# Patient Record
Sex: Male | Born: 1975
Health system: Southern US, Community
[De-identification: ages and names within clinical notes are randomized; demographics above are authoritative.]

## PROBLEM LIST (undated history)

## (undated) DIAGNOSIS — G4700 Insomnia, unspecified: Secondary | ICD-10-CM

## (undated) DIAGNOSIS — R0602 Shortness of breath: Secondary | ICD-10-CM

## (undated) DIAGNOSIS — R519 Headache, unspecified: Secondary | ICD-10-CM

## (undated) DIAGNOSIS — I10 Essential (primary) hypertension: Secondary | ICD-10-CM

## (undated) DIAGNOSIS — G43909 Migraine, unspecified, not intractable, without status migrainosus: Secondary | ICD-10-CM

## (undated) DIAGNOSIS — M159 Polyosteoarthritis, unspecified: Secondary | ICD-10-CM

## (undated) DIAGNOSIS — J45909 Unspecified asthma, uncomplicated: Secondary | ICD-10-CM

## (undated) DIAGNOSIS — E559 Vitamin D deficiency, unspecified: Secondary | ICD-10-CM

## (undated) DIAGNOSIS — I4711 Inappropriate sinus tachycardia, so stated: Secondary | ICD-10-CM

## (undated) DIAGNOSIS — Z91018 Allergy to other foods: Secondary | ICD-10-CM

## (undated) DIAGNOSIS — E7849 Other hyperlipidemia: Secondary | ICD-10-CM

## (undated) DIAGNOSIS — L42 Pityriasis rosea: Secondary | ICD-10-CM

## (undated) DIAGNOSIS — G4730 Sleep apnea, unspecified: Secondary | ICD-10-CM

## (undated) DIAGNOSIS — K589 Irritable bowel syndrome without diarrhea: Secondary | ICD-10-CM

## (undated) DIAGNOSIS — R000 Tachycardia, unspecified: Secondary | ICD-10-CM

## (undated) DIAGNOSIS — R011 Cardiac murmur, unspecified: Secondary | ICD-10-CM

## (undated) DIAGNOSIS — J309 Allergic rhinitis, unspecified: Secondary | ICD-10-CM

## (undated) DIAGNOSIS — N319 Neuromuscular dysfunction of bladder, unspecified: Secondary | ICD-10-CM

## (undated) DIAGNOSIS — IMO0002 Reserved for concepts with insufficient information to code with codable children: Secondary | ICD-10-CM

## (undated) DIAGNOSIS — E785 Hyperlipidemia, unspecified: Secondary | ICD-10-CM

## (undated) DIAGNOSIS — R002 Palpitations: Secondary | ICD-10-CM

## (undated) DIAGNOSIS — K760 Fatty (change of) liver, not elsewhere classified: Secondary | ICD-10-CM

## (undated) DIAGNOSIS — R1310 Dysphagia, unspecified: Secondary | ICD-10-CM

## (undated) DIAGNOSIS — Z79890 Hormone replacement therapy (postmenopausal): Secondary | ICD-10-CM

## (undated) DIAGNOSIS — E1169 Type 2 diabetes mellitus with other specified complication: Secondary | ICD-10-CM

## (undated) DIAGNOSIS — E1142 Type 2 diabetes mellitus with diabetic polyneuropathy: Secondary | ICD-10-CM

## (undated) DIAGNOSIS — M549 Dorsalgia, unspecified: Secondary | ICD-10-CM

## (undated) DIAGNOSIS — R079 Chest pain, unspecified: Secondary | ICD-10-CM

## (undated) DIAGNOSIS — K3189 Other diseases of stomach and duodenum: Secondary | ICD-10-CM

## (undated) DIAGNOSIS — E1140 Type 2 diabetes mellitus with diabetic neuropathy, unspecified: Secondary | ICD-10-CM

## (undated) DIAGNOSIS — G5622 Lesion of ulnar nerve, left upper limb: Secondary | ICD-10-CM

## (undated) DIAGNOSIS — K9049 Malabsorption due to intolerance, not elsewhere classified: Secondary | ICD-10-CM

## (undated) DIAGNOSIS — M7989 Other specified soft tissue disorders: Secondary | ICD-10-CM

## (undated) DIAGNOSIS — M2550 Pain in unspecified joint: Secondary | ICD-10-CM

## (undated) DIAGNOSIS — F32A Depression, unspecified: Secondary | ICD-10-CM

## (undated) DIAGNOSIS — E7800 Pure hypercholesterolemia, unspecified: Secondary | ICD-10-CM

## (undated) DIAGNOSIS — E739 Lactose intolerance, unspecified: Secondary | ICD-10-CM

## (undated) DIAGNOSIS — Z933 Colostomy status: Secondary | ICD-10-CM

## (undated) DIAGNOSIS — R339 Retention of urine, unspecified: Secondary | ICD-10-CM

## (undated) DIAGNOSIS — E109 Type 1 diabetes mellitus without complications: Secondary | ICD-10-CM

## (undated) DIAGNOSIS — K769 Liver disease, unspecified: Secondary | ICD-10-CM

## (undated) HISTORY — DX: Dysphagia, unspecified: R13.10

## (undated) HISTORY — PX: TRIGGER FINGER RELEASE: SHX641

## (undated) HISTORY — PX: SUPRAPUBIC CATHETER PLACEMENT: SHX2473

## (undated) HISTORY — DX: Type 2 diabetes mellitus with diabetic neuropathy, unspecified: E11.40

## (undated) HISTORY — DX: Chest pain, unspecified: R07.9

## (undated) HISTORY — DX: Fatty (change of) liver, not elsewhere classified: K76.0

## (undated) HISTORY — PX: ABDOMINAL HYSTERECTOMY: SHX81

## (undated) HISTORY — DX: Pure hypercholesterolemia, unspecified: E78.00

## (undated) HISTORY — DX: Hyperlipidemia, unspecified: E78.5

## (undated) HISTORY — DX: Type 1 diabetes mellitus without complications: E10.9

## (undated) HISTORY — DX: Polyosteoarthritis, unspecified: M15.9

## (undated) HISTORY — DX: Type 2 diabetes mellitus with diabetic polyneuropathy: E11.42

## (undated) HISTORY — DX: Dorsalgia, unspecified: M54.9

## (undated) HISTORY — DX: Shortness of breath: R06.02

## (undated) HISTORY — DX: Insomnia, unspecified: G47.00

## (undated) HISTORY — DX: Liver disease, unspecified: K76.9

## (undated) HISTORY — DX: Colostomy status: Z93.3

## (undated) HISTORY — DX: Other hyperlipidemia: E78.49

## (undated) HISTORY — DX: Tachycardia, unspecified: R00.0

## (undated) HISTORY — DX: Palpitations: R00.2

## (undated) HISTORY — DX: Unspecified asthma, uncomplicated: J45.909

## (undated) HISTORY — DX: Essential (primary) hypertension: I10

## (undated) HISTORY — DX: Hormone replacement therapy: Z79.890

## (undated) HISTORY — DX: Morbid (severe) obesity due to excess calories: E66.01

## (undated) HISTORY — DX: Reserved for concepts with insufficient information to code with codable children: IMO0002

## (undated) HISTORY — DX: Other specified soft tissue disorders: M79.89

## (undated) HISTORY — PX: SEPTOPLASTY: SUR1290

## (undated) HISTORY — DX: Depression, unspecified: F32.A

## (undated) HISTORY — DX: Allergic rhinitis, unspecified: J30.9

## (undated) HISTORY — DX: Malabsorption due to intolerance, not elsewhere classified: K90.49

## (undated) HISTORY — DX: Migraine, unspecified, not intractable, without status migrainosus: G43.909

## (undated) HISTORY — DX: Pain in unspecified joint: M25.50

## (undated) HISTORY — DX: Type 2 diabetes mellitus with other specified complication: E11.69

## (undated) HISTORY — DX: Other diseases of stomach and duodenum: K31.89

## (undated) HISTORY — DX: Inappropriate sinus tachycardia, so stated: I47.11

## (undated) HISTORY — DX: Sleep apnea, unspecified: G47.30

## (undated) HISTORY — DX: Pityriasis rosea: L42

## (undated) HISTORY — PX: PARASTOMAL HERNIA REPAIR: SHX2162

## (undated) HISTORY — DX: Lesion of ulnar nerve, left upper limb: G56.22

## (undated) HISTORY — DX: Irritable bowel syndrome, unspecified: K58.9

## (undated) HISTORY — DX: Retention of urine, unspecified: R33.9

## (undated) HISTORY — DX: Allergy to other foods: Z91.018

## (undated) HISTORY — DX: Lactose intolerance, unspecified: E73.9

## (undated) HISTORY — DX: Vitamin D deficiency, unspecified: E55.9

## (undated) HISTORY — DX: Neuromuscular dysfunction of bladder, unspecified: N31.9

---

## 1998-07-26 HISTORY — PX: CHOLECYSTECTOMY: SHX55

## 2003-07-27 HISTORY — PX: BREAST REDUCTION SURGERY: SHX8

## 2003-07-27 HISTORY — PX: RHINOPLASTY: SUR1284

## 2004-07-26 HISTORY — PX: SPINAL FUSION: SHX223

## 2011-03-01 ENCOUNTER — Ambulatory Visit: Payer: Self-pay | Admitting: Endocrinology

## 2011-03-23 ENCOUNTER — Encounter: Payer: Self-pay | Admitting: Endocrinology

## 2011-03-23 ENCOUNTER — Ambulatory Visit (INDEPENDENT_AMBULATORY_CARE_PROVIDER_SITE_OTHER): Payer: Medicare Other | Admitting: Endocrinology

## 2011-03-23 DIAGNOSIS — M545 Low back pain: Secondary | ICD-10-CM

## 2011-03-23 DIAGNOSIS — E109 Type 1 diabetes mellitus without complications: Secondary | ICD-10-CM

## 2011-03-23 DIAGNOSIS — R109 Unspecified abdominal pain: Secondary | ICD-10-CM

## 2011-03-23 NOTE — Patient Instructions (Addendum)
good diet and exercise habits significanly improve the control of your diabetes.  please let me know if you wish to be referred to a dietician.  high blood sugar is very risky to your health.  you should see an eye doctor every year. controlling your blood pressure and cholesterol drastically reduces the damage diabetes does to your body.  this also applies to quitting smoking.  please discuss these with your doctor.   check your blood sugar 2 times a day.  vary the time of day when you check, between before the 3 meals, and at bedtime.  also check if you have symptoms of your blood sugar being too high or too low.  please keep a record of the readings and bring it to your next appointment here.  please call us sooner if you are having low blood sugar episodes. we will need to take this complex situation in stages.  For now, reduce the lantus to 20 units at bedtime, and: Increase humalog to 1 unit for each 6 grams of carbohydrate.   Please make a follow-up appointment in 2-4 weeks. (update: i left message on phone-tree:  rx as we discussed)

## 2011-03-23 NOTE — Progress Notes (Signed)
Subjective:    Patient ID: Gavin Smith, male    DOB: 1975/11/22, 35 y.o.   MRN: [id number]  HPI pt states 14 years h/o dm.  She reports autonomic and peripheral sensory neuropathy.  However, she says these predated the dx of dm.  He has had extensive w/u of the gi sxs she has been on insulin since dx.  she takes lantus, and prn humalog (she says this is 1 unit for each 7 grams of carbohydrate--ends up being 25-38 units per meal).  Pt says cbg's are variable--sometimes low, and sometimes over 400.  She says it is in general higher as the day goes on.  She says she cannot take huamlog until after eating, because of her sxs--15 years of postprandial moderate cramps in the abdomen, and assoc diarrhea. pt says his diet is very good, and exercise is limited by low-back pain.    Past Medical History  Diagnosis Date  . Extrinsic asthma, unspecified   . Allergic rhinitis, cause unspecified   . Essential hypertension, benign   . Type I (juvenile type) diabetes mellitus without mention of complication, not stated as uncontrolled   . Pure hypercholesterolemia   . Generalized osteoarthrosis, involving multiple sites   . Unspecified sleep apnea   . Pityriasis rosea   . Migraine, unspecified, without mention of intractable migraine without mention of status migrainosus   . Insomnia, unspecified     Past Surgical History  Procedure Date  . Breast reduction surgery 2005  . Spinal fusion 2006    L5-S1 spinal fusion  . Cholecystectomy 2000  . Rhinoplasty 2005    History   Social History  . Marital Status: Single    Spouse Name: N/A    Number of Children: 0  . Years of Education: 14   Occupational History  . Not on file.   Social History Main Topics  . Smoking status: Never Smoker   . Smokeless tobacco: Not on file  . Alcohol Use: No  . Drug Use: No  . Sexually Active:    Other Topics Concern  . Not on file   Social History Narrative  . No narrative on file  disabled  No current  outpatient prescriptions on file prior to visit.    Allergies  Allergen Reactions  . Morphine And Related   . Penicillins     Family History  Problem Relation Age of Onset  . Diabetes Mother   . Hypertension Mother   . Hyperlipidemia Mother   . Hypertension Father   . Hyperlipidemia Father   . Diabetes Maternal Grandmother   . Cancer Maternal Grandmother     Breast Cancer  . Hypertension Maternal Grandfather   . Heart disease Maternal Grandfather   . Heart disease Paternal Grandfather   . Cancer Other     Ovarian Cancer-Grandparent   BP 110/84  Pulse 101  Temp(Src) 99.1 F (37.3 C) (Oral)  Ht 5\' 9"  (1.753 m)  Wt 297 lb 12.8 oz (135.081 kg)  BMI 43.98 kg/m2  SpO2 95%  Review of Systems denies weight loss, blurry vision, chest pain, sob, n/v, cramps, rhinorrhea, and memory loss.  She has hypoglycemia when she gets the diarrhea.  She has urinary frequency, depression, easy bruising, and excessive diaphoresis.  She attributes headaches to head injury a few years ago.  She attributes sob to asthma.    Objective:   Physical Exam VS: see vs page GEN: no distress.  obese HEAD: head: no deformity eyes: no periorbital swelling, no  proptosis external nose and ears are normal mouth: no lesion seen NECK: supple, thyroid is not enlarged CHEST WALL: no deformity CV: reg rate and rhythm, no murmur ABD: abdomen is soft, nontender.  no hepatosplenomegaly.  not distended.  no hernia MUSCULOSKELETAL: muscle bulk and strength are grossly normal.  no obvious joint swelling.  gait is normal and steady EXTEMITIES: no deformity.  no ulcer on the feet.  feet are of normal color and temp.  no edema PULSES: dorsalis pedis intact bilat.  no carotid bruit NEURO:  cn 2-12 grossly intact.   readily moves all 4's.  sensation is intact to touch on the feet SKIN:  Normal texture and temperature.  No rash or suspicious lesion is visible.   NODES:  None palpable at the neck PSYCH: alert, oriented  x3.  Does not appear anxious nor depressed.  outside test results are reviewed: A1c=8.7     Assessment & Plan:  Dm.  The pattern of cbg's indicates the need for more humalog, and less lantus.  Low-back pain.  This limits exercise rx of dm Gi sxs, uncertain etiology.  This also limits the rx of dm. Morbid obesity.  This worsens insulin resistance.

## 2011-03-24 ENCOUNTER — Encounter: Payer: Self-pay | Admitting: Endocrinology

## 2011-03-24 DIAGNOSIS — E1142 Type 2 diabetes mellitus with diabetic polyneuropathy: Secondary | ICD-10-CM | POA: Insufficient documentation

## 2011-03-24 DIAGNOSIS — M159 Polyosteoarthritis, unspecified: Secondary | ICD-10-CM | POA: Insufficient documentation

## 2011-03-24 DIAGNOSIS — I10 Essential (primary) hypertension: Secondary | ICD-10-CM | POA: Insufficient documentation

## 2011-03-24 DIAGNOSIS — J45909 Unspecified asthma, uncomplicated: Secondary | ICD-10-CM | POA: Insufficient documentation

## 2011-04-20 ENCOUNTER — Ambulatory Visit: Payer: Medicare Other | Admitting: Endocrinology

## 2011-05-28 ENCOUNTER — Encounter: Payer: Self-pay | Admitting: Endocrinology

## 2011-05-28 ENCOUNTER — Ambulatory Visit (INDEPENDENT_AMBULATORY_CARE_PROVIDER_SITE_OTHER): Payer: Medicare Other | Admitting: Endocrinology

## 2011-05-28 ENCOUNTER — Other Ambulatory Visit (INDEPENDENT_AMBULATORY_CARE_PROVIDER_SITE_OTHER): Payer: Medicare Other

## 2011-05-28 VITALS — BP 124/86 | HR 109 | Temp 98.4°F | Ht 69.0 in | Wt 295.0 lb

## 2011-05-28 DIAGNOSIS — E109 Type 1 diabetes mellitus without complications: Secondary | ICD-10-CM

## 2011-05-28 DIAGNOSIS — R109 Unspecified abdominal pain: Secondary | ICD-10-CM

## 2011-05-28 LAB — HEMOGLOBIN A1C: Hgb A1c MFr Bld: 7.6 % — ABNORMAL HIGH (ref 4.6–6.5)

## 2011-05-28 NOTE — Patient Instructions (Addendum)
check your blood sugar 2 times a day.  vary the time of day when you check, between before the 3 meals, and at bedtime.  also check if you have symptoms of your blood sugar being too high or too low.  please keep a record of the readings and bring it to your next appointment here.  please call us sooner if you are having low blood sugar episodes. blood tests are being requested for you today.  please call [phone] to hear your test results.  You will be prompted to enter the 9-digit "MRN" number that appears at the top left of this page, followed by #.  Then you will hear the message. we will need to take this complex situation in stages.   For now, reduce the lantus to 20 units at bedtime, and:   Increase humalog to 1 unit for each 5 grams of carbohydrate.   Please make a follow-up appointment in 1 month.   Please sign release of information from your gastroenterolgist in [place].

## 2011-05-28 NOTE — Progress Notes (Signed)
Subjective:    Patient ID: Gavin Smith, male    DOB: [birth year], 35 y.o.   MRN: [id number]  HPI Pt says control of her dm continues to be affected by her gi motility problems.  She has "years" of intermittent moderate discomfort in the abdomen, and assoc diarrhea.  she brings a record of her cbg's which i have reviewed today.  It varies from 44-350.  However, most are in the 100's.  Pt says these cbg's are better since last ov.  She increased the humalog, but she did not decrease the lantus.  She says the hypoglycemia is when she misses meals.  She averages approx 100 units of humalog per day Past Medical History  Diagnosis Date  . Unspecified sleep apnea   . Pityriasis rosea   . Migraine, unspecified, without mention of intractable migraine without mention of status migrainosus   . Insomnia, unspecified   . Allergic rhinitis, cause unspecified   . Generalized osteoarthrosis, involving multiple sites   . Extrinsic asthma, unspecified   . Type I (juvenile type) diabetes mellitus without mention of complication, not stated as uncontrolled   . Pure hypercholesterolemia   . Essential hypertension, benign     Past Surgical History  Procedure Date  . Breast reduction surgery 2005  . Spinal fusion 2006    L5-S1 spinal fusion  . Cholecystectomy 2000  . Rhinoplasty 2005    History   Social History  . Marital Status: Single    Spouse Name: N/A    Number of Children: 0  . Years of Education: 14   Occupational History  . Not on file.   Social History Main Topics  . Smoking status: Never Smoker   . Smokeless tobacco: Not on file  . Alcohol Use: No  . Drug Use: No  . Sexually Active:    Other Topics Concern  . Not on file   Social History Narrative  . No narrative on file    Current Outpatient Prescriptions on File Prior to Visit  Medication Sig Dispense Refill  . albuterol (PROAIR HFA) 108 (90 BASE) MCG/ACT inhaler Inhale 1 puff into the lungs as needed.        Marland Kitchen albuterol  (PROVENTIL) (2.5 MG/3ML) 0.083% nebulizer solution Take 2.5 mg by nebulization as needed.        . Cholecalciferol (VITAMIN D3) 5000 UNITS CAPS Take 1 capsule by mouth daily.        Marland Kitchen doxycycline (DORYX) 75 MG EC tablet Take 75 mg by mouth 2 (two) times daily.        Marland Kitchen gabapentin (NEURONTIN) 600 MG tablet Take 600 mg by mouth 3 (three) times daily.        Marland Kitchen glucose blood test strip 1 each by Other route as needed. Use as instructed       . insulin glargine (LANTUS SOLOSTAR) 100 UNIT/ML injection Inject 20 Units into the skin every evening.       . insulin lispro (HUMALOG KWIKPEN) 100 UNIT/ML injection Inject into the skin. variable dosage, total of approx 100 units per day      . Lancets Thin MISC Use as directed       . lisinopril (PRINIVIL,ZESTRIL) 10 MG tablet Take 10 mg by mouth daily.        Marland Kitchen loratadine (CLARITIN) 10 MG tablet Take 10 mg by mouth daily.        . Melatonin 5 MG TABS Take 1 tablet by mouth at bedtime as needed.       Marland Kitchen  Multiple Vitamin (MULTI-DAY PO) Take 1 tablet by mouth daily.        . simvastatin (ZOCOR) 40 MG tablet Take 40 mg by mouth at bedtime.        . traMADol (ULTRAM) 50 MG tablet Take 50 mg by mouth as needed.        . vitamin C (ASCORBIC ACID) 500 MG tablet Take 500 mg by mouth daily.        Marland Kitchen zolpidem (AMBIEN) 10 MG tablet Take 10 mg by mouth at bedtime as needed.          Allergies  Allergen Reactions  . Morphine And Related   . Penicillins     Family History  Problem Relation Age of Onset  . Diabetes Mother   . Hypertension Mother   . Hyperlipidemia Mother   . Hypertension Father   . Hyperlipidemia Father   . Diabetes Maternal Grandmother   . Cancer Maternal Grandmother     Breast Cancer  . Hypertension Maternal Grandfather   . Heart disease Maternal Grandfather   . Heart disease Paternal Grandfather   . Cancer Other     Ovarian Cancer-Grandparent   BP 124/86  Pulse 109  Temp(Src) 98.4 F (36.9 C) (Oral)  Ht 5\' 9"  (1.753 m)  Wt 295 lb  (133.811 kg)  BMI 43.56 kg/m2  SpO2 97%  Review of Systems Denies loc.    Objective:   Physical Exam VITAL SIGNS:  See vs page. GENERAL: no distress. SKIN:  Insulin injection sites at the anterior abdomen are normal.  Lab Results  Component Value Date   HGBA1C 7.6* 05/28/2011      Assessment & Plan:  Gi dx of uncertain nature. Dm, Based on the pattern of her cbg's, she needs some adjustment in her therapy.

## 2011-07-09 ENCOUNTER — Encounter: Payer: Self-pay | Admitting: *Deleted

## 2011-07-28 NOTE — Telephone Encounter (Signed)
A user error has taken place: encounter opened in error, closed for administrative reasons.

## 2011-09-06 ENCOUNTER — Telehealth: Payer: Self-pay | Admitting: Endocrinology

## 2011-09-06 MED ORDER — GLUCOSE BLOOD VI STRP: ORAL_STRIP | Status: DC

## 2011-09-06 NOTE — Telephone Encounter (Signed)
Pt is requesting test strips--pt [phone]

## 2011-09-06 NOTE — Telephone Encounter (Signed)
Rx sent to pharmacy for Onetouch Ultra test strips-pt informed.

## 2011-09-16 ENCOUNTER — Other Ambulatory Visit: Payer: Self-pay | Admitting: *Deleted

## 2011-09-16 MED ORDER — GLUCOSE BLOOD VI STRP: ORAL_STRIP | Status: AC

## 2011-09-16 NOTE — Telephone Encounter (Signed)
Pt needs rx for test strips to be corrected from twice daily to four times daily because of Medicare paperwork. Rx resent to pharmacy.

## 2011-10-01 ENCOUNTER — Ambulatory Visit (INDEPENDENT_AMBULATORY_CARE_PROVIDER_SITE_OTHER): Payer: Medicare Other | Admitting: Endocrinology

## 2011-10-01 ENCOUNTER — Other Ambulatory Visit (INDEPENDENT_AMBULATORY_CARE_PROVIDER_SITE_OTHER): Payer: Medicare Other

## 2011-10-01 ENCOUNTER — Encounter: Payer: Self-pay | Admitting: Endocrinology

## 2011-10-01 VITALS — BP 112/82 | HR 101 | Temp 98.4°F | Ht 69.0 in | Wt 292.1 lb

## 2011-10-01 DIAGNOSIS — E109 Type 1 diabetes mellitus without complications: Secondary | ICD-10-CM

## 2011-10-01 LAB — HEMOGLOBIN A1C: Hgb A1c MFr Bld: 8.3 % — ABNORMAL HIGH (ref 4.6–6.5)

## 2011-10-01 NOTE — Patient Instructions (Addendum)
check your blood sugar 2 times a day.  vary the time of day when you check, between before the 3 meals, and at bedtime.  also check if you have symptoms of your blood sugar being too high or too low.  please keep a record of the readings and bring it to your next appointment here.  please call us sooner if you are having low blood sugar episodes. blood tests are being requested for you today.  please call [phone] to hear your test results.  You will be prompted to enter the 9-digit "MRN" number that appears at the top left of this page, followed by #.  Then you will hear the message.  For now, reduce the lantus to 20 units at bedtime, and:   take humalog, 1 unit for each 5 grams of carbohydrate.   Please make a follow-up appointment in 3 months. Here is a copy of 1 of the forms from [facility]. (update: i left message on phone-tree:  Increase lantus to 27 qhs.  Please come back for a follow-up appointment in 6 weeks)

## 2011-10-01 NOTE — Progress Notes (Signed)
Subjective:    Patient ID: Gavin Smith, male    DOB: 1976/04/30, 36 y.o.   MRN: [id number]  HPI Pt returns for f/u of type 1 DM (1998).  Pt says she ran out of strips.  i showed her the 3 forms i have completed for test strips ([facility]).  She takes lantus 27 units qhs, and humalog at a varying dosage.   Past Medical History  Diagnosis Date  . Unspecified sleep apnea   . Pityriasis rosea   . Migraine, unspecified, without mention of intractable migraine without mention of status migrainosus   . Insomnia, unspecified   . Allergic rhinitis, cause unspecified   . Generalized osteoarthrosis, involving multiple sites   . Extrinsic asthma, unspecified   . Type I (juvenile type) diabetes mellitus without mention of complication, not stated as uncontrolled   . Pure hypercholesterolemia   . Essential hypertension, benign     Past Surgical History  Procedure Date  . Breast reduction surgery 2005  . Spinal fusion 2006    L5-S1 spinal fusion  . Cholecystectomy 2000  . Rhinoplasty 2005    History   Social History  . Marital Status: Single    Spouse Name: N/A    Number of Children: 0  . Years of Education: 14   Occupational History  . Not on file.   Social History Main Topics  . Smoking status: Never Smoker   . Smokeless tobacco: Not on file  . Alcohol Use: No  . Drug Use: No  . Sexually Active:    Other Topics Concern  . Not on file   Social History Narrative  . No narrative on file    Current Outpatient Prescriptions on File Prior to Visit  Medication Sig Dispense Refill  . albuterol (PROAIR HFA) 108 (90 BASE) MCG/ACT inhaler Inhale 1 puff into the lungs as needed.        Marland Kitchen albuterol (PROVENTIL) (2.5 MG/3ML) 0.083% nebulizer solution Take 2.5 mg by nebulization as needed.        . Cholecalciferol (VITAMIN D3) 5000 UNITS CAPS Take 1 capsule by mouth daily.        Marland Kitchen gabapentin (NEURONTIN) 600 MG tablet Take 600 mg by mouth 3 (three) times daily.        Marland Kitchen glucose blood  (ONE TOUCH ULTRA TEST) test strip Use as instructed two ttimes daily dx 250.01  200 each  3  . insulin glargine (LANTUS SOLOSTAR) 100 UNIT/ML injection Inject 20 Units into the skin every evening.       . insulin lispro (HUMALOG KWIKPEN) 100 UNIT/ML injection Inject into the skin. variable dosage, total of approx 100 units per day      . Lancets Thin MISC Use as directed       . lisinopril (PRINIVIL,ZESTRIL) 10 MG tablet Take 10 mg by mouth daily.        Marland Kitchen loratadine (CLARITIN) 10 MG tablet Take 10 mg by mouth daily.        . Melatonin 5 MG TABS Take 1 tablet by mouth at bedtime as needed.       . Multiple Vitamin (MULTI-DAY PO) Take 1 tablet by mouth daily.        . simvastatin (ZOCOR) 40 MG tablet Take 40 mg by mouth at bedtime.        . traMADol (ULTRAM) 50 MG tablet Take 50 mg by mouth as needed.        . vitamin C (ASCORBIC ACID) 500 MG tablet Take  500 mg by mouth daily.        Marland Kitchen zolpidem (AMBIEN) 10 MG tablet Take 10 mg by mouth at bedtime as needed.          Allergies  Allergen Reactions  . Morphine And Related   . Penicillins     Family History  Problem Relation Age of Onset  . Diabetes Mother   . Hypertension Mother   . Hyperlipidemia Mother   . Hypertension Father   . Hyperlipidemia Father   . Diabetes Maternal Grandmother   . Cancer Maternal Grandmother     Breast Cancer  . Hypertension Maternal Grandfather   . Heart disease Maternal Grandfather   . Heart disease Paternal Grandfather   . Cancer Other     Ovarian Cancer-Grandparent    BP 112/82  Pulse 101  Temp(Src) 98.4 F (36.9 C) (Oral)  Ht 5\' 9"  (1.753 m)  Wt 292 lb 1.9 oz (132.505 kg)  BMI 43.14 kg/m2  SpO2 97%    Review of Systems denies hypoglycemia    Objective:   Physical Exam VITAL SIGNS:  See vs page GENERAL: no distress Pulses: dorsalis pedis intact bilat.   Feet: no deformity.  no ulcer on the feet.  feet are of normal color and temp.  no edema Neuro: sensation is intact to touch on the  feet, but decreased from normal.    Lab Results  Component Value Date   HGBA1C 8.3* 10/01/2011      Assessment & Plan:  DM, therapy limited by noncompliance.  i'll do the best i can.  needs increased rx

## 2012-01-03 ENCOUNTER — Ambulatory Visit: Payer: Medicare Other | Admitting: Endocrinology

## 2012-01-07 ENCOUNTER — Other Ambulatory Visit (INDEPENDENT_AMBULATORY_CARE_PROVIDER_SITE_OTHER): Payer: Medicare Other

## 2012-01-07 ENCOUNTER — Ambulatory Visit (INDEPENDENT_AMBULATORY_CARE_PROVIDER_SITE_OTHER): Payer: Medicare Other | Admitting: Endocrinology

## 2012-01-07 ENCOUNTER — Encounter: Payer: Self-pay | Admitting: Endocrinology

## 2012-01-07 VITALS — BP 108/70 | HR 116 | Temp 99.2°F | Ht 69.0 in | Wt 291.0 lb

## 2012-01-07 DIAGNOSIS — E109 Type 1 diabetes mellitus without complications: Secondary | ICD-10-CM

## 2012-01-07 LAB — HEMOGLOBIN A1C: Hgb A1c MFr Bld: 7.5 % — ABNORMAL HIGH (ref 4.6–6.5)

## 2012-01-07 MED ORDER — "PARADIGM QUICK-SET 32"" 6MM MISC": 1.0000 | Status: DC

## 2012-01-07 MED ORDER — PARADIGM PUMP RESERVOIR 3ML MISC: 1.0000 | Status: DC

## 2012-01-07 NOTE — Patient Instructions (Addendum)
check your blood sugar 2 times a day.  vary the time of day when you check, between before the 3 meals, and at bedtime.  also check if you have symptoms of your blood sugar being too high or too low.  please keep a record of the readings and bring it to your next appointment here.  please call us sooner if you are having low blood sugar episodes.  continue basal rate of 1.2 units/hr (00:00-09:00), 1.8 units/hr (09:11:00), 1.85 units/hr (11:00-21:30), and 1.4 units/hr (21:30-24:00) continue mealtime bolus of 1 unit/ 18 grams carbohydrate for breakfast, 1 unit/20 grams with lunch, and 1 unit 21 grams with supper. continue correction bolus (which some people call "sensitivity," or "insulin sensitivity ratio," or just "isr") of 1 unit for each 26 by which your glucose exceeds 100 at midnight, 30 at noon, and 28 in the evening.   Please make a follow-up appointment in 3 months.  blood tests are being requested for you today.  You will receive a letter with results.

## 2012-01-07 NOTE — Progress Notes (Signed)
Subjective:    Patient ID: Gavin Smith, male    DOB: 10-19-1975, 36 y.o.   MRN: [id number]  HPI Pt returns for f/u of type 1 DM (dx'ed 1998; no known complications).  She started on an insulin pump 4 weeks ago.  she brings a record of her cbg's which i have reviewed today.  It varies from 84-200's.  There is no trend throughout the day.  pt states she feels well in general.  She averages 50 units/day, total.   Past Medical History  Diagnosis Date  . Unspecified sleep apnea   . Pityriasis rosea   . Migraine, unspecified, without mention of intractable migraine without mention of status migrainosus   . Insomnia, unspecified   . Allergic rhinitis, cause unspecified   . Generalized osteoarthrosis, involving multiple sites   . Extrinsic asthma, unspecified   . Type I (juvenile type) diabetes mellitus without mention of complication, not stated as uncontrolled   . Pure hypercholesterolemia   . Essential hypertension, benign     Past Surgical History  Procedure Date  . Breast reduction surgery 2005  . Spinal fusion 2006    L5-S1 spinal fusion  . Cholecystectomy 2000  . Rhinoplasty 2005    History   Social History  . Marital Status: Single    Spouse Name: N/A    Number of Children: 0  . Years of Education: 14   Occupational History  . Not on file.   Social History Main Topics  . Smoking status: Never Smoker   . Smokeless tobacco: Not on file  . Alcohol Use: No  . Drug Use: No  . Sexually Active:    Other Topics Concern  . Not on file   Social History Narrative  . No narrative on file    Current Outpatient Prescriptions on File Prior to Visit  Medication Sig Dispense Refill  . albuterol (PROAIR HFA) 108 (90 BASE) MCG/ACT inhaler Inhale 1 puff into the lungs as needed.        Marland Kitchen albuterol (PROVENTIL) (2.5 MG/3ML) 0.083% nebulizer solution Take 2.5 mg by nebulization as needed.        Marland Kitchen glucose blood (ONE TOUCH ULTRA TEST) test strip Use as instructed two ttimes daily dx  250.01  200 each  3  . insulin lispro (HUMALOG KWIKPEN) 100 UNIT/ML injection Inject into the skin. variable dosage, total of approx 100 units per day      . Lancets Thin MISC Use as directed       . lisinopril (PRINIVIL,ZESTRIL) 10 MG tablet Take 10 mg by mouth daily.        Marland Kitchen loratadine (CLARITIN) 10 MG tablet Take 10 mg by mouth daily.        . Melatonin 5 MG TABS Take 1 tablet by mouth at bedtime as needed.       . Multiple Vitamin (MULTI-DAY PO) Take 1 tablet by mouth daily.        Marland Kitchen NOVOFINE 32G X 6 MM MISC Use as directed      . simvastatin (ZOCOR) 40 MG tablet Take 40 mg by mouth at bedtime.        . sulfamethoxazole-trimethoprim (BACTRIM DS) 800-160 MG per tablet Take 1 tablet by mouth daily.       . traMADol (ULTRAM) 50 MG tablet Take 50 mg by mouth as needed.        . vitamin C (ASCORBIC ACID) 500 MG tablet Take 500 mg by mouth daily.        Marland Kitchen  zolpidem (AMBIEN) 10 MG tablet Take 10 mg by mouth at bedtime as needed.        . insulin glargine (LANTUS SOLOSTAR) 100 UNIT/ML injection Inject 20 Units into the skin every evening.         Allergies  Allergen Reactions  . Morphine And Related   . Penicillins     Family History  Problem Relation Age of Onset  . Diabetes Mother   . Hypertension Mother   . Hyperlipidemia Mother   . Hypertension Father   . Hyperlipidemia Father   . Diabetes Maternal Grandmother   . Cancer Maternal Grandmother     Breast Cancer  . Hypertension Maternal Grandfather   . Heart disease Maternal Grandfather   . Heart disease Paternal Grandfather   . Cancer Other     Ovarian Cancer-Grandparent    BP 108/70  Pulse 116  Temp 99.2 F (37.3 C) (Oral)  Ht 5\' 9"  (1.753 m)  Wt 291 lb (131.997 kg)  BMI 42.97 kg/m2  SpO2 97%  Review of Systems denies hypoglycemia.      Objective:   Physical Exam VITAL SIGNS:  See vs page GENERAL: no distress SKIN:  Insulin infusion sites at the anterior abdomen are normal Lab Results  Component Value Date    HGBA1C 7.5* 01/07/2012      Assessment & Plan:  DM.  Too soon to tell control.

## 2012-01-08 ENCOUNTER — Encounter: Payer: Self-pay | Admitting: Endocrinology

## 2012-01-11 ENCOUNTER — Telehealth: Payer: Self-pay | Admitting: *Deleted

## 2012-01-11 NOTE — Telephone Encounter (Signed)
Called pt to inform of lab results, pt informed (letter also mailed to pt). 

## 2012-01-19 ENCOUNTER — Telehealth: Payer: Self-pay | Admitting: *Deleted

## 2012-01-19 NOTE — Telephone Encounter (Signed)
Regarding fasting lab orders for insulin pump supplies, pt would like fasting lab orders faxed to PCP's office to have orders done there because it is difficult for her to travel to [place]. Form placed on MD's desk to review.

## 2012-01-20 NOTE — Telephone Encounter (Signed)
i printed letter 

## 2012-01-20 NOTE — Telephone Encounter (Signed)
Order faxed to [facility] at [phone]. Pt informed via VM orders faxed to Clinic and to have them send results to [place].

## 2012-01-24 ENCOUNTER — Telehealth: Payer: Self-pay | Admitting: *Deleted

## 2012-01-24 NOTE — Telephone Encounter (Signed)
Pt came in today to have fasting labs drawn for diabetic pump supplies at PCP's office. They can draw a Anti Islet Ratio and want to know if this is the same test as the Anti Islet Cell Antibody on order.

## 2012-01-24 NOTE — Telephone Encounter (Signed)
[facility] # [phone]

## 2012-01-24 NOTE — Telephone Encounter (Signed)
Spoke with Jacki Cones and she states they use [facility]. [employer] in [place] book to [place] as requested.

## 2012-02-04 NOTE — Telephone Encounter (Signed)
Awaiting results of Fasting labs from the [facility]. As of 02/03/2012, results not back yet. Will place on SAE's desk once results are received.

## 2012-03-06 ENCOUNTER — Other Ambulatory Visit: Payer: Self-pay | Admitting: Endocrinology

## 2012-03-31 ENCOUNTER — Ambulatory Visit: Payer: Medicare Other | Admitting: Endocrinology

## 2012-04-07 ENCOUNTER — Ambulatory Visit: Payer: Medicare Other | Admitting: Endocrinology

## 2012-04-13 ENCOUNTER — Other Ambulatory Visit: Payer: Self-pay

## 2012-04-13 MED ORDER — INSULIN GLARGINE 100 UNIT/ML ~~LOC~~ SOLN: 20.0000 [IU] | Freq: Every evening | SUBCUTANEOUS | Status: DC

## 2012-04-13 NOTE — Telephone Encounter (Signed)
Pt states that she needs refills of both Lantus and Humalog sent to [facility] in [place]. Pt is no longer on her insulin pump because her insurance would not cover it any longer so she is back on the [place]ens. Please advise on units needed for Humalog Kwikpen.

## 2012-04-13 NOTE — Telephone Encounter (Signed)
Patient called [facility] stating that she has been trying to get insulin refilled x 1 wk. She is requesting a call back to advise of new pharmacy and where to send RX. Thanks

## 2012-04-14 ENCOUNTER — Telehealth: Payer: Self-pay | Admitting: Endocrinology

## 2012-04-14 MED ORDER — INSULIN LISPRO 100 UNIT/ML ~~LOC~~ SOLN: SUBCUTANEOUS | Status: DC

## 2012-04-14 NOTE — Telephone Encounter (Signed)
Pt needs a refill on Humalog to [facility] in [place]...she is in the pharmacy waiting now

## 2012-05-31 ENCOUNTER — Encounter: Payer: Self-pay | Admitting: Endocrinology

## 2012-05-31 ENCOUNTER — Ambulatory Visit (INDEPENDENT_AMBULATORY_CARE_PROVIDER_SITE_OTHER): Payer: Medicaid Other | Admitting: Endocrinology

## 2012-05-31 VITALS — BP 126/84 | HR 98 | Temp 99.1°F | Wt 292.0 lb

## 2012-05-31 DIAGNOSIS — E109 Type 1 diabetes mellitus without complications: Secondary | ICD-10-CM

## 2012-05-31 NOTE — Progress Notes (Signed)
Subjective:    Patient ID: Gavin Smith, male    DOB: 1976/01/25, 36 y.o.   MRN: [id number]  HPI Pt returns for f/u of type 1 DM (dx'ed 1998; no known complications).  She started on an insulin pump a few mos ago.  However, insurance has denied pump supplies (she says the same happened in [place], in 2007).   She takes lantus 20/day, and humalog 1 unit/5 g cho (she averages 8-12 units per meal, but she often eats only 1 meal/day).  she brings a record of her cbg's which i have reviewed today. It varies from 41-250.  There is no trend throughout the day.  She declines ref to dietician, saying it would not help.  She reports idiopathic autonomic neuropathy of the gi tract is compromising the care of his dm.  She will see dr in w-s tomorrow.   Past Medical History  Diagnosis Date  . Unspecified sleep apnea   . Pityriasis rosea   . Migraine, unspecified, without mention of intractable migraine without mention of status migrainosus   . Insomnia, unspecified   . Allergic rhinitis, cause unspecified   . Generalized osteoarthrosis, involving multiple sites   . Extrinsic asthma, unspecified   . Type I (juvenile type) diabetes mellitus without mention of complication, not stated as uncontrolled   . Pure hypercholesterolemia   . Essential hypertension, benign     Past Surgical History  Procedure Date  . Breast reduction surgery 2005  . Spinal fusion 2006    L5-S1 spinal fusion  . Cholecystectomy 2000  . Rhinoplasty 2005    History   Social History  . Marital Status: Single    Spouse Name: N/A    Number of Children: 0  . Years of Education: 14   Occupational History  . Not on file.   Social History Main Topics  . Smoking status: Never Smoker   . Smokeless tobacco: Not on file  . Alcohol Use: No  . Drug Use: No  . Sexually Active:    Other Topics Concern  . Not on file   Social History Narrative  . No narrative on file    Current Outpatient Prescriptions on File Prior to  Visit  Medication Sig Dispense Refill  . albuterol (PROAIR HFA) 108 (90 BASE) MCG/ACT inhaler Inhale 1 puff into the lungs as needed.        Marland Kitchen albuterol (PROVENTIL) (2.5 MG/3ML) 0.083% nebulizer solution Take 2.5 mg by nebulization as needed.        . Cholecalciferol (VITAMIN D) 2000 UNITS CAPS Take 1 capsule by mouth daily.      Marland Kitchen glucose blood (ONE TOUCH ULTRA TEST) test strip Use as instructed two ttimes daily dx 250.01  200 each  3  . insulin glargine (LANTUS SOLOSTAR) 100 UNIT/ML injection Inject 20 Units into the skin every evening.  10 mL  11  . insulin lispro (HUMALOG KWIKPEN) 100 UNIT/ML injection variable dosage, total of approx 100 units per day  30 mL  2  . Lancets Thin MISC Use as directed       . lisinopril (PRINIVIL,ZESTRIL) 10 MG tablet Take 10 mg by mouth daily.        Marland Kitchen loratadine (CLARITIN) 10 MG tablet Take 10 mg by mouth daily.        . Melatonin 5 MG TABS Take 1 tablet by mouth at bedtime as needed.       . Multiple Vitamin (MULTI-DAY PO) Take 1 tablet by mouth daily.        Marland Kitchen  NOVOFINE 32G X 6 MM MISC USE AS DIRECTED  100 each  3  . simvastatin (ZOCOR) 40 MG tablet Take 40 mg by mouth at bedtime.        . sulfamethoxazole-trimethoprim (BACTRIM DS) 800-160 MG per tablet Take 1 tablet by mouth daily.       . traMADol (ULTRAM) 50 MG tablet Take 50 mg by mouth as needed.        . vitamin C (ASCORBIC ACID) 500 MG tablet Take 500 mg by mouth daily.        Marland Kitchen zolpidem (AMBIEN) 10 MG tablet Take 10 mg by mouth at bedtime as needed.          Allergies  Allergen Reactions  . Morphine And Related   . Penicillins     Family History  Problem Relation Age of Onset  . Diabetes Mother   . Hypertension Mother   . Hyperlipidemia Mother   . Hypertension Father   . Hyperlipidemia Father   . Diabetes Maternal Grandmother   . Cancer Maternal Grandmother     Breast Cancer  . Hypertension Maternal Grandfather   . Heart disease Maternal Grandfather   . Heart disease Paternal  Grandfather   . Cancer Other     Ovarian Cancer-Grandparent    BP 126/84  Pulse 98  Temp 99.1 F (37.3 C) (Oral)  Wt 292 lb (132.45 kg)  SpO2 98%  Review of Systems Denies LOC    Objective:   Physical Exam VITAL SIGNS:  See vs page GENERAL: no distress Pulses: dorsalis pedis intact bilat.   Feet: no deformity.  no ulcer on the feet.  feet are of normal color and temp.  no edema Neuro: sensation is intact to touch on the feet, but decreased from normal.       Assessment & Plan:  DM.   Therapy is limited by gi sxs.

## 2012-05-31 NOTE — Patient Instructions (Addendum)
The nest step in the management of your diabetes is to improve your diet.  Please see the dr in w-s as scheduled.   Please come back for a follow-up appointment in 3 months.

## 2012-07-05 ENCOUNTER — Other Ambulatory Visit: Payer: Self-pay | Admitting: Endocrinology

## 2012-07-11 ENCOUNTER — Other Ambulatory Visit: Payer: Self-pay

## 2012-10-30 ENCOUNTER — Ambulatory Visit: Payer: Medicare Other | Admitting: Endocrinology

## 2013-01-16 ENCOUNTER — Other Ambulatory Visit: Payer: Self-pay | Admitting: *Deleted

## 2013-01-16 MED ORDER — INSULIN PEN NEEDLE 32G X 6 MM MISC: Status: DC

## 2013-01-16 NOTE — Telephone Encounter (Signed)
New Rx needs to be sent with diagnosis code. Resending.

## 2013-06-14 ENCOUNTER — Other Ambulatory Visit: Payer: Self-pay | Admitting: *Deleted

## 2013-06-14 MED ORDER — INSULIN PEN NEEDLE 32G X 6 MM MISC: Status: DC

## 2014-08-08 DIAGNOSIS — R197 Diarrhea, unspecified: Secondary | ICD-10-CM | POA: Diagnosis not present

## 2014-08-20 DIAGNOSIS — Z3042 Encounter for surveillance of injectable contraceptive: Secondary | ICD-10-CM | POA: Diagnosis not present

## 2014-09-20 DIAGNOSIS — K769 Liver disease, unspecified: Secondary | ICD-10-CM | POA: Diagnosis not present

## 2014-09-24 ENCOUNTER — Ambulatory Visit (INDEPENDENT_AMBULATORY_CARE_PROVIDER_SITE_OTHER): Payer: Medicare Other | Admitting: Internal Medicine

## 2014-09-24 ENCOUNTER — Encounter: Payer: Self-pay | Admitting: Internal Medicine

## 2014-09-24 VITALS — BP 112/64 | HR 103 | Temp 99.0°F | Resp 12 | Ht 68.0 in | Wt 302.0 lb

## 2014-09-24 DIAGNOSIS — E1142 Type 2 diabetes mellitus with diabetic polyneuropathy: Secondary | ICD-10-CM | POA: Diagnosis not present

## 2014-09-24 DIAGNOSIS — E1165 Type 2 diabetes mellitus with hyperglycemia: Principal | ICD-10-CM

## 2014-09-24 DIAGNOSIS — IMO0002 Reserved for concepts with insufficient information to code with codable children: Secondary | ICD-10-CM

## 2014-09-24 NOTE — Patient Instructions (Signed)
Please continue: - NovoLog:  - ICR 1:2 - but 1:1 for potatoes and rice  - target: 170  - ISF: 7.5:    -170-200: 4 units, 200-230: 8 units, etc  Please return in 1 month with your sugar log.   Check sugars 3x a day and write them down.  PATIENT INSTRUCTIONS FOR TYPE 2 DIABETES:  **Please join MyChart!** - see attached instructions about how to join if you have not done so already.  DIET AND EXERCISE Diet and exercise is an important part of diabetic treatment.  We recommended aerobic exercise in the form of brisk walking (working between 40-60% of maximal aerobic capacity, similar to brisk walking) for 150 minutes per week (such as 30 minutes five days per week) along with 3 times per week performing 'resistance' training (using various gauge rubber tubes with handles) 5-10 exercises involving the major muscle groups (upper body, lower body and core) performing 10-15 repetitions (or near fatigue) each exercise. Start at half the above goal but build slowly to reach the above goals. If limited by weight, joint pain, or disability, we recommend daily walking in a swimming pool with water up to waist to reduce pressure from joints while allow for adequate exercise.    BLOOD GLUCOSES Monitoring your blood glucoses is important for continued management of your diabetes. Please check your blood glucoses 2-4 times a day: fasting, before meals and at bedtime (you can rotate these measurements - e.g. one day check before the 3 meals, the next day check before 2 of the meals and before bedtime, etc.).   HYPOGLYCEMIA (low blood sugar) Hypoglycemia is usually a reaction to not eating, exercising, or taking too much insulin/ other diabetes drugs.  Symptoms include tremors, sweating, hunger, confusion, headache, etc. Treat IMMEDIATELY with 15 grams of Carbs: . 4 glucose tablets .  cup regular juice/soda . 2 tablespoons raisins . 4 teaspoons sugar . 1 tablespoon honey Recheck blood glucose in 15 mins  and repeat above if still symptomatic/blood glucose <100.  RECOMMENDATIONS TO REDUCE YOUR RISK OF DIABETIC COMPLICATIONS: * Take your prescribed MEDICATION(S) * Follow a DIABETIC diet: Complex carbs, fiber rich foods, (monounsaturated and polyunsaturated) fats * AVOID saturated/trans fats, high fat foods, >2,300 mg salt per day. * EXERCISE at least 5 times a week for 30 minutes or preferably daily.  * DO NOT SMOKE OR DRINK more than 1 drink a day. * Check your FEET every day. Do not wear tightfitting shoes. Contact us if you develop an ulcer * See your EYE doctor once a year or more if needed * Get a FLU shot once a year * Get a PNEUMONIA vaccine once before and once after age 80 years  GOALS:  * Your Hemoglobin A1c of <7%  * fasting sugars need to be <130 * after meals sugars need to be <180 (2h after you start eating) * Your Systolic BP should be 140 or lower  * Your Diastolic BP should be 80 or lower  * Your HDL (Good Cholesterol) should be 40 or higher  * Your LDL (Bad Cholesterol) should be 100 or lower. * Your Triglycerides should be 150 or lower  * Your Urine microalbumin (kidney function) should be <30 * Your Body Mass Index should be 25 or lower    Please consider the following ways to cut down carbs and fat and increase fiber and micronutrients in your diet: - substitute whole grain for white bread or pasta - substitute brown rice for white rice -  substitute 90-calorie flat bread pieces for slices of bread when possible - substitute sweet potatoes or yams for white potatoes - substitute humus for margarine - substitute tofu for cheese when possible - substitute almond or rice milk for regular milk (would not drink soy milk daily due to concern for soy estrogen influence on breast cancer risk) - substitute dark chocolate for other sweets when possible - substitute water - can add lemon or orange slices for taste - for diet sodas (artificial sweeteners will trick your body  that you can eat sweets without getting calories and will lead you to overeating and weight gain in the long run) - do not skip breakfast or other meals (this will slow down the metabolism and will result in more weight gain over time)  - can try smoothies made from fruit and almond/rice milk in am instead of regular breakfast - can also try old-fashioned (not instant) oatmeal made with almond/rice milk in am - order the dressing on the side when eating salad at a restaurant (pour less than half of the dressing on the salad) - eat as little meat as possible - can try juicing, but should not forget that juicing will get rid of the fiber, so would alternate with eating raw veg./fruits or drinking smoothies - use as little oil as possible, even when using olive oil - can dress a salad with a mix of balsamic vinegar and lemon juice, for e.g. - use agave nectar, stevia sugar, or regular sugar rather than artificial sweateners - steam or broil/roast veggies  - snack on veggies/fruit/nuts (unsalted, preferably) when possible, rather than processed foods - reduce or eliminate aspartame in diet (it is in diet sodas, chewing gum, etc) Read the labels!  Try to read Dr. Katherina RightNeal Barnard's book: "Program for Reversing Diabetes" for other ideas for healthy eating.

## 2014-09-24 NOTE — Progress Notes (Addendum)
Patient ID: Gavin Smith, male   DOB: 13-Apr-1976, 39 y.o.   MRN: [id number]  HPI: Gavin Smith is a 39 y.o.-year-old male, referred by her PCP, Dr. Theador Smith, for management of DM2, dx Gavin 32 (39 y/o), insulin-dependent, uncontrolled, wit complications (PN). She saw Dr Gavin Smith >2 years ago, then Dr. Welford Smith at [facility].  She tells me she has a h/o autonomic neuropathy of the GI tract (dx Gavin 1986, when she was 39 y/o - not related to DM - and has severe diarrhea: "food goes through me"), seeing Dr. In Smith. She may need a colostomy.   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 She tells me that Humalog did not work for her >> sugars >200 constantly. She had to have a PA to switch to NovoLog >> now restarted after the last hbA1c returned >> sugars improved. - NovoLog:  - ICR 1:2 (but depending on what she eats) - but 1:1 for potatoes and rice  - target: 170  - ISF: 7.5 -170-200: 4 units, 200-230: 8 units, etc She cannot digest pills. She tried oral meds Gavin the past >> "they went right through me". She also tried Lantus >> this did not work for her >> sugars unchanged despite increasing the dose.  Pt checks her sugars 3x a day and they are: She has a reversed circadian rhythm. - evening (waking up): 137-162, 201 - 2h after b'fast: 160-262, 290 - before lunch:  n/c - 2h after lunch: n/c - before dinner: 103-224 - 2h after dinner: 250-281 - bedtime: 257 (sick) - nighttime: 154, 193, 228 No recent lows. Lowest sugar was 17!!! - 47 more recently.; she has hypoglycemia awareness at 60. Highest sugars recently 290s.  Glucometer: One Touch  Pt's  meals are (eats when she is hungry - but may not be able to eat for days if she has to get out of the house, to avoid diarrhea/incontinence): - Breakfast: skips - Lunch: rice, veggies, soup, grilled cheese, spaghetti - Dinner: rice, veggies, soup, grilled cheese, spaghetti - Snacks: fruit 2-3x a day  - no CKD, last BUN/creatinine:  07/23/2014: 12/0.9, Glu 298 On Lisinipril. - last set of lipids: 07/24/2015: 324/868/42/171 AST/ALT: 121/76 Taken off statin b/c hemochromatosis. - last eye exam was Gavin 2014. No DR.  - + numbness and tingling Gavin her feet.  Vit D 13.9 Gavin 06/2014 despite being on 50,000 IU 4x a week. She tells me insurance stopped covering this.  Pt has FH of DM Gavin mother and GF, cousin.  She was also dx with hemochromatosis, allergy-triggered asthma. Also, HTN, HL, OSA -on CPAP, transaminitis, GERD, depression. She is on Depo-Provera.  ROS: Constitutional: no weight gain/loss, + fatigue, + hot flushes Eyes: + blurry vision, no xerophthalmia ENT: no sore throat, no nodules palpated Gavin throat, no dysphagia/odynophagia, no hoarseness, + decreased hearing Cardiovascular: + CP/+ SOB/no palpitations/leg swelling Respiratory: no cough/+ SOB Gastrointestinal: + heartburn/+ N/no V/+ D/no C Musculoskeletal: no muscle/+ joint aches Skin: no rashes Neurological: no tremors/numbness/tingling/dizziness, + HA Psychiatric: + depression/no anxiety  Past Medical History  Diagnosis Date  . Unspecified sleep apnea   . Pityriasis rosea   .  Migraine, unspecified, without mention of intractable migraine without mention of status migrainosus   . Insomnia, unspecified   . Allergic rhinitis, cause unspecified   . Generalized osteoarthrosis, involving multiple sites   . Extrinsic asthma, unspecified   . Type I (juvenile type) diabetes mellitus without mention of complication, not stated as uncontrolled   . Pure hypercholesterolemia   . Essential hypertension, benign    Past Surgical  History  Procedure Laterality Date  . Breast reduction surgery  2005  . Spinal fusion  2006    L5-S1 spinal fusion  . Cholecystectomy  2000  . Rhinoplasty  2005   History   Social History  . Marital Status: Single    Spouse Name: N/A  . Number of Children: 0  . Years of Education: 14   Occupational History  . Prev. EMS, now disabled   Social History Main Topics  . Smoking status: Never Smoker   . Smokeless tobacco: Not on file  . Alcohol Use: No  . Drug Use: No   Current Outpatient Prescriptions on File Prior to Visit  Medication Sig Dispense Refill  . albuterol (PROAIR HFA) 108 (90 BASE) MCG/ACT inhaler Inhale 1 puff into the lungs as needed.      Marland Kitchen albuterol (PROVENTIL) (2.5 MG/3ML) 0.083% nebulizer solution Take 2.5 mg by nebulization as needed.      . Cholecalciferol (VITAMIN D) 2000 UNITS CAPS Take 1 capsule by mouth daily.    Marland Kitchen HUMALOG KWIKPEN 100 UNIT/ML injection VARIABLE DOSAGE, INJECT SQ TOTAL OF APPROX 100 UNITS PER DAY 30 mL 1  . insulin glargine (LANTUS SOLOSTAR) 100 UNIT/ML injection Inject 20 Units into the skin every evening. 10 mL 11  . Insulin Pen Needle (NOVOFINE) 32G X 6 MM MISC USE AS DIRECTED Dx Code: 250.01 100 each 3  . Lancets Thin MISC Use as directed     . lisinopril (PRINIVIL,ZESTRIL) 10 MG tablet Take 10 mg by mouth daily.      Marland Kitchen loratadine (CLARITIN) 10 MG tablet Take 10 mg by mouth daily.      . Melatonin 5 MG TABS Take 1 tablet by mouth at bedtime as needed.     . Multiple Vitamin (MULTI-DAY PO) Take 1 tablet by mouth daily.      . simvastatin (ZOCOR) 40 MG tablet Take 40 mg by mouth at bedtime.      . sulfamethoxazole-trimethoprim (BACTRIM DS) 800-160 MG per tablet Take 1 tablet by mouth daily.     . traMADol (ULTRAM) 50 MG tablet Take 50 mg by mouth as needed.      . vitamin C (ASCORBIC ACID) 500 MG tablet Take 500 mg by mouth daily.      Marland Kitchen zolpidem (AMBIEN) 10 MG tablet Take 10 mg by mouth at bedtime as needed.       No current  facility-administered medications on file prior to visit.   Allergies  Allergen Reactions  . Morphine And Related   . Penicillins    Family History  Problem Relation Age of Onset  . Diabetes Mother   . Hypertension Mother   . Hyperlipidemia Mother   . Hypertension Father   . Hyperlipidemia Father   . Diabetes Maternal Grandmother   . Cancer Maternal Grandmother     Breast Cancer  . Hypertension Maternal Grandfather   . Heart disease Maternal Grandfather   . Heart disease Paternal Grandfather   . Cancer Other     Ovarian Cancer-Grandparent   PE: BP 112/64 mmHg  Pulse  103  Temp(Src) 99 F (37.2 C) (Oral)  Resp 12  Ht 5\' 8"  (1.727 m)  Wt 302 lb (136.986 kg)  BMI 45.93 kg/m2  SpO2 98% Wt Readings from Last 3 Encounters:  09/24/14 302 lb (136.986 kg)  05/31/12 292 lb (132.45 kg)  01/07/12 291 lb (131.997 kg)   Constitutional: obese, Gavin NAD Eyes: PERRLA, EOMI, no exophthalmos ENT: moist mucous membranes, no thyromegaly, no cervical lymphadenopathy Cardiovascular: RRR, No MRG Respiratory: CTA B Gastrointestinal: abdomen soft, NT, ND, BS+ Musculoskeletal: no deformities, strength intact Gavin Smith 4 Skin: moist, warm, no rashes Neurological: no tremor with outstretched hands, DTR normal Gavin Smith 4  ASSESSMENT: 1. DM2, insulin-dependent, uncontrolled, with complications - PN  2. Vit D def  3. HTG  PLAN:  1. Patient with long-standing, uncontrolled diabetes, on mealtime insulin only. Pt has had a lot of problems controlling her DM over the years 2/2 impredictability of retaining the food she eats due to her autonomic GI neuropathy. She developed an elaborate system of getting rapid acting insulin Gavin and avoiding lows. This comes with the price of high sugars, however, they have improved Gavin last 2 months after switching back to NovoLog. Also, she is checking postprandials 30-45 min after she eats to see if she needs to inject more insulin with that meal. She knows that this is  not ideal, but she cannot inject more at the beginning of the meal as she does not know for sure if she keeps it down.  For now, we will continue the same regimen, and I advised her to check sugars 2-3x a day and write them down >> will check at next visit. Will check a HbA1c then, too.  - I suggested to:  Patient Instructions  Please continue: - NovoLog:  - ICR 1:2 - but 1:1 for potatoes and rice  - target: 170  - ISF: 7.5:    -170-200: 4 units, 200-230: 8 units, etc  Please return Gavin 1 month with your sugar log.   Check sugars 3x a day and write them down.  - Strongly advised her to start checking sugars at different times of the day - check 2-3 times a day, rotating checks - given sugar log and advised how to fill it and to bring it at next appt  - given foot care handout and explained the principles  - given instructions for hypoglycemia management "15-15 rule"  - advised for yearly eye exams >> she is due - Return to clinic Gavin 1 mo with sugar log   2. Vit D def - reviewed previous level >> very low, despite high dose Ergocalciferol.  - will recheck at next visit, if not checked by PCP - has appt coming up  3. HTG - per PCP - may need fibrates

## 2014-09-26 DIAGNOSIS — J4520 Mild intermittent asthma, uncomplicated: Secondary | ICD-10-CM | POA: Diagnosis not present

## 2014-09-26 DIAGNOSIS — E781 Pure hyperglyceridemia: Secondary | ICD-10-CM | POA: Diagnosis not present

## 2014-09-26 DIAGNOSIS — I10 Essential (primary) hypertension: Secondary | ICD-10-CM | POA: Diagnosis not present

## 2014-09-26 DIAGNOSIS — E782 Mixed hyperlipidemia: Secondary | ICD-10-CM | POA: Diagnosis not present

## 2014-09-26 DIAGNOSIS — G908 Other disorders of autonomic nervous system: Secondary | ICD-10-CM | POA: Diagnosis not present

## 2014-09-26 DIAGNOSIS — M545 Low back pain: Secondary | ICD-10-CM | POA: Diagnosis not present

## 2014-10-10 DIAGNOSIS — K219 Gastro-esophageal reflux disease without esophagitis: Secondary | ICD-10-CM | POA: Diagnosis not present

## 2014-10-10 DIAGNOSIS — K769 Liver disease, unspecified: Secondary | ICD-10-CM | POA: Diagnosis not present

## 2014-10-10 DIAGNOSIS — M359 Systemic involvement of connective tissue, unspecified: Secondary | ICD-10-CM | POA: Diagnosis not present

## 2014-10-10 DIAGNOSIS — R1310 Dysphagia, unspecified: Secondary | ICD-10-CM | POA: Diagnosis not present

## 2014-10-15 DIAGNOSIS — G909 Disorder of the autonomic nervous system, unspecified: Secondary | ICD-10-CM | POA: Diagnosis not present

## 2014-10-15 DIAGNOSIS — E1069 Type 1 diabetes mellitus with other specified complication: Secondary | ICD-10-CM | POA: Diagnosis not present

## 2014-10-15 DIAGNOSIS — G4733 Obstructive sleep apnea (adult) (pediatric): Secondary | ICD-10-CM | POA: Diagnosis not present

## 2014-10-21 DIAGNOSIS — E119 Type 2 diabetes mellitus without complications: Secondary | ICD-10-CM | POA: Diagnosis not present

## 2014-10-21 DIAGNOSIS — R000 Tachycardia, unspecified: Secondary | ICD-10-CM | POA: Diagnosis not present

## 2014-10-21 DIAGNOSIS — K760 Fatty (change of) liver, not elsewhere classified: Secondary | ICD-10-CM | POA: Diagnosis not present

## 2014-10-21 DIAGNOSIS — G4733 Obstructive sleep apnea (adult) (pediatric): Secondary | ICD-10-CM | POA: Diagnosis not present

## 2014-10-21 DIAGNOSIS — Z881 Allergy status to other antibiotic agents status: Secondary | ICD-10-CM | POA: Diagnosis not present

## 2014-10-21 DIAGNOSIS — E871 Hypo-osmolality and hyponatremia: Secondary | ICD-10-CM | POA: Diagnosis not present

## 2014-10-21 DIAGNOSIS — Z794 Long term (current) use of insulin: Secondary | ICD-10-CM | POA: Diagnosis not present

## 2014-10-21 DIAGNOSIS — K6389 Other specified diseases of intestine: Secondary | ICD-10-CM | POA: Diagnosis not present

## 2014-10-21 DIAGNOSIS — Z6841 Body Mass Index (BMI) 40.0 and over, adult: Secondary | ICD-10-CM | POA: Diagnosis not present

## 2014-10-21 DIAGNOSIS — G909 Disorder of the autonomic nervous system, unspecified: Secondary | ICD-10-CM | POA: Diagnosis not present

## 2014-10-21 DIAGNOSIS — R109 Unspecified abdominal pain: Secondary | ICD-10-CM | POA: Diagnosis not present

## 2014-10-21 DIAGNOSIS — K7581 Nonalcoholic steatohepatitis (NASH): Secondary | ICD-10-CM | POA: Diagnosis not present

## 2014-10-21 DIAGNOSIS — Z885 Allergy status to narcotic agent status: Secondary | ICD-10-CM | POA: Diagnosis not present

## 2014-10-21 DIAGNOSIS — R159 Full incontinence of feces: Secondary | ICD-10-CM | POA: Diagnosis not present

## 2014-10-21 DIAGNOSIS — E1142 Type 2 diabetes mellitus with diabetic polyneuropathy: Secondary | ICD-10-CM | POA: Diagnosis not present

## 2014-10-21 DIAGNOSIS — F418 Other specified anxiety disorders: Secondary | ICD-10-CM | POA: Diagnosis not present

## 2014-10-21 DIAGNOSIS — R1032 Left lower quadrant pain: Secondary | ICD-10-CM | POA: Diagnosis not present

## 2014-10-21 DIAGNOSIS — Z880 Allergy status to penicillin: Secondary | ICD-10-CM | POA: Diagnosis not present

## 2014-10-21 DIAGNOSIS — R262 Difficulty in walking, not elsewhere classified: Secondary | ICD-10-CM | POA: Diagnosis not present

## 2014-10-21 DIAGNOSIS — Z79899 Other long term (current) drug therapy: Secondary | ICD-10-CM | POA: Diagnosis not present

## 2014-10-21 DIAGNOSIS — R945 Abnormal results of liver function studies: Secondary | ICD-10-CM | POA: Diagnosis not present

## 2014-10-21 DIAGNOSIS — E782 Mixed hyperlipidemia: Secondary | ICD-10-CM | POA: Diagnosis not present

## 2014-10-21 DIAGNOSIS — I10 Essential (primary) hypertension: Secondary | ICD-10-CM | POA: Diagnosis not present

## 2014-10-21 DIAGNOSIS — Z888 Allergy status to other drugs, medicaments and biological substances status: Secondary | ICD-10-CM | POA: Diagnosis not present

## 2014-10-25 HISTORY — PX: COLOSTOMY: SHX63

## 2014-10-29 DIAGNOSIS — Z433 Encounter for attention to colostomy: Secondary | ICD-10-CM | POA: Diagnosis not present

## 2014-10-29 DIAGNOSIS — Z794 Long term (current) use of insulin: Secondary | ICD-10-CM | POA: Diagnosis not present

## 2014-10-29 DIAGNOSIS — E119 Type 2 diabetes mellitus without complications: Secondary | ICD-10-CM | POA: Diagnosis not present

## 2014-10-29 DIAGNOSIS — Z6841 Body Mass Index (BMI) 40.0 and over, adult: Secondary | ICD-10-CM | POA: Diagnosis not present

## 2014-10-29 DIAGNOSIS — G909 Disorder of the autonomic nervous system, unspecified: Secondary | ICD-10-CM | POA: Diagnosis not present

## 2014-10-30 DIAGNOSIS — E119 Type 2 diabetes mellitus without complications: Secondary | ICD-10-CM | POA: Diagnosis not present

## 2014-10-30 DIAGNOSIS — Z433 Encounter for attention to colostomy: Secondary | ICD-10-CM | POA: Diagnosis not present

## 2014-10-30 DIAGNOSIS — G909 Disorder of the autonomic nervous system, unspecified: Secondary | ICD-10-CM | POA: Diagnosis not present

## 2014-10-30 DIAGNOSIS — Z6841 Body Mass Index (BMI) 40.0 and over, adult: Secondary | ICD-10-CM | POA: Diagnosis not present

## 2014-10-30 DIAGNOSIS — Z794 Long term (current) use of insulin: Secondary | ICD-10-CM | POA: Diagnosis not present

## 2014-11-01 DIAGNOSIS — E119 Type 2 diabetes mellitus without complications: Secondary | ICD-10-CM | POA: Diagnosis not present

## 2014-11-01 DIAGNOSIS — Z6841 Body Mass Index (BMI) 40.0 and over, adult: Secondary | ICD-10-CM | POA: Diagnosis not present

## 2014-11-01 DIAGNOSIS — Z794 Long term (current) use of insulin: Secondary | ICD-10-CM | POA: Diagnosis not present

## 2014-11-01 DIAGNOSIS — Z433 Encounter for attention to colostomy: Secondary | ICD-10-CM | POA: Diagnosis not present

## 2014-11-01 DIAGNOSIS — G909 Disorder of the autonomic nervous system, unspecified: Secondary | ICD-10-CM | POA: Diagnosis not present

## 2014-11-04 DIAGNOSIS — G909 Disorder of the autonomic nervous system, unspecified: Secondary | ICD-10-CM | POA: Diagnosis not present

## 2014-11-04 DIAGNOSIS — Z433 Encounter for attention to colostomy: Secondary | ICD-10-CM | POA: Diagnosis not present

## 2014-11-04 DIAGNOSIS — Z794 Long term (current) use of insulin: Secondary | ICD-10-CM | POA: Diagnosis not present

## 2014-11-04 DIAGNOSIS — E119 Type 2 diabetes mellitus without complications: Secondary | ICD-10-CM | POA: Diagnosis not present

## 2014-11-04 DIAGNOSIS — Z6841 Body Mass Index (BMI) 40.0 and over, adult: Secondary | ICD-10-CM | POA: Diagnosis not present

## 2014-11-05 DIAGNOSIS — E119 Type 2 diabetes mellitus without complications: Secondary | ICD-10-CM | POA: Diagnosis not present

## 2014-11-05 DIAGNOSIS — G909 Disorder of the autonomic nervous system, unspecified: Secondary | ICD-10-CM | POA: Diagnosis not present

## 2014-11-05 DIAGNOSIS — Z794 Long term (current) use of insulin: Secondary | ICD-10-CM | POA: Diagnosis not present

## 2014-11-05 DIAGNOSIS — Z433 Encounter for attention to colostomy: Secondary | ICD-10-CM | POA: Diagnosis not present

## 2014-11-05 DIAGNOSIS — Z6841 Body Mass Index (BMI) 40.0 and over, adult: Secondary | ICD-10-CM | POA: Diagnosis not present

## 2014-11-08 DIAGNOSIS — Z794 Long term (current) use of insulin: Secondary | ICD-10-CM | POA: Diagnosis not present

## 2014-11-08 DIAGNOSIS — Z6841 Body Mass Index (BMI) 40.0 and over, adult: Secondary | ICD-10-CM | POA: Diagnosis not present

## 2014-11-08 DIAGNOSIS — E119 Type 2 diabetes mellitus without complications: Secondary | ICD-10-CM | POA: Diagnosis not present

## 2014-11-08 DIAGNOSIS — G909 Disorder of the autonomic nervous system, unspecified: Secondary | ICD-10-CM | POA: Diagnosis not present

## 2014-11-08 DIAGNOSIS — Z433 Encounter for attention to colostomy: Secondary | ICD-10-CM | POA: Diagnosis not present

## 2014-11-11 DIAGNOSIS — Z6841 Body Mass Index (BMI) 40.0 and over, adult: Secondary | ICD-10-CM | POA: Diagnosis not present

## 2014-11-11 DIAGNOSIS — Z794 Long term (current) use of insulin: Secondary | ICD-10-CM | POA: Diagnosis not present

## 2014-11-11 DIAGNOSIS — Z433 Encounter for attention to colostomy: Secondary | ICD-10-CM | POA: Diagnosis not present

## 2014-11-11 DIAGNOSIS — G909 Disorder of the autonomic nervous system, unspecified: Secondary | ICD-10-CM | POA: Diagnosis not present

## 2014-11-11 DIAGNOSIS — E119 Type 2 diabetes mellitus without complications: Secondary | ICD-10-CM | POA: Diagnosis not present

## 2014-11-12 ENCOUNTER — Ambulatory Visit: Payer: Medicare Other | Admitting: Internal Medicine

## 2014-11-14 DIAGNOSIS — Z6841 Body Mass Index (BMI) 40.0 and over, adult: Secondary | ICD-10-CM | POA: Diagnosis not present

## 2014-11-14 DIAGNOSIS — Z794 Long term (current) use of insulin: Secondary | ICD-10-CM | POA: Diagnosis not present

## 2014-11-14 DIAGNOSIS — Z433 Encounter for attention to colostomy: Secondary | ICD-10-CM | POA: Diagnosis not present

## 2014-11-14 DIAGNOSIS — G909 Disorder of the autonomic nervous system, unspecified: Secondary | ICD-10-CM | POA: Diagnosis not present

## 2014-11-14 DIAGNOSIS — E119 Type 2 diabetes mellitus without complications: Secondary | ICD-10-CM | POA: Diagnosis not present

## 2014-11-19 ENCOUNTER — Encounter (HOSPITAL_COMMUNITY): Payer: Self-pay

## 2014-11-22 DIAGNOSIS — Z794 Long term (current) use of insulin: Secondary | ICD-10-CM | POA: Diagnosis not present

## 2014-11-22 DIAGNOSIS — E119 Type 2 diabetes mellitus without complications: Secondary | ICD-10-CM | POA: Diagnosis not present

## 2014-11-22 DIAGNOSIS — Z6841 Body Mass Index (BMI) 40.0 and over, adult: Secondary | ICD-10-CM | POA: Diagnosis not present

## 2014-11-22 DIAGNOSIS — Z433 Encounter for attention to colostomy: Secondary | ICD-10-CM | POA: Diagnosis not present

## 2014-11-22 DIAGNOSIS — G909 Disorder of the autonomic nervous system, unspecified: Secondary | ICD-10-CM | POA: Diagnosis not present

## 2014-11-24 DIAGNOSIS — G909 Disorder of the autonomic nervous system, unspecified: Secondary | ICD-10-CM | POA: Diagnosis not present

## 2014-11-24 DIAGNOSIS — Z794 Long term (current) use of insulin: Secondary | ICD-10-CM | POA: Diagnosis not present

## 2014-11-24 DIAGNOSIS — Z6841 Body Mass Index (BMI) 40.0 and over, adult: Secondary | ICD-10-CM | POA: Diagnosis not present

## 2014-11-24 DIAGNOSIS — Z433 Encounter for attention to colostomy: Secondary | ICD-10-CM | POA: Diagnosis not present

## 2014-11-24 DIAGNOSIS — E119 Type 2 diabetes mellitus without complications: Secondary | ICD-10-CM | POA: Diagnosis not present

## 2014-11-26 DIAGNOSIS — Z433 Encounter for attention to colostomy: Secondary | ICD-10-CM | POA: Diagnosis not present

## 2014-11-26 DIAGNOSIS — E119 Type 2 diabetes mellitus without complications: Secondary | ICD-10-CM | POA: Diagnosis not present

## 2014-11-26 DIAGNOSIS — Z6841 Body Mass Index (BMI) 40.0 and over, adult: Secondary | ICD-10-CM | POA: Diagnosis not present

## 2014-11-26 DIAGNOSIS — G909 Disorder of the autonomic nervous system, unspecified: Secondary | ICD-10-CM | POA: Diagnosis not present

## 2014-11-26 DIAGNOSIS — Z794 Long term (current) use of insulin: Secondary | ICD-10-CM | POA: Diagnosis not present

## 2014-11-28 ENCOUNTER — Ambulatory Visit (INDEPENDENT_AMBULATORY_CARE_PROVIDER_SITE_OTHER): Payer: Medicare Other | Admitting: Internal Medicine

## 2014-11-28 ENCOUNTER — Encounter: Payer: Self-pay | Admitting: Internal Medicine

## 2014-11-28 VITALS — BP 118/64 | HR 104 | Temp 98.5°F | Resp 12 | Wt 297.8 lb

## 2014-11-28 DIAGNOSIS — IMO0002 Reserved for concepts with insufficient information to code with codable children: Secondary | ICD-10-CM

## 2014-11-28 DIAGNOSIS — E1142 Type 2 diabetes mellitus with diabetic polyneuropathy: Secondary | ICD-10-CM

## 2014-11-28 DIAGNOSIS — E1165 Type 2 diabetes mellitus with hyperglycemia: Principal | ICD-10-CM

## 2014-11-28 DIAGNOSIS — E559 Vitamin D deficiency, unspecified: Secondary | ICD-10-CM | POA: Diagnosis not present

## 2014-11-28 LAB — VITAMIN D 25 HYDROXY (VIT D DEFICIENCY, FRACTURES): VITD: 19.32 ng/mL — ABNORMAL LOW (ref 30.00–100.00)

## 2014-11-28 NOTE — Patient Instructions (Addendum)
Patient Instructions  Please continue: - NovoLog:  - ICR 1:2 - but 1:1 for potatoes and rice  - target: 170  - ISF: 7.5:    -170-200: 4 units, 200-230: 8 units, etc  Please return in 3 months with your sugar log.   Please stop at the lab.

## 2014-11-28 NOTE — Progress Notes (Signed)
Patient ID: Gavin Smith, male   DOB: [birth year], 39 y.o.   MRN: [id number]  HPI: Gavin Smith is a 39 y.o.-year-old male, referred by her PCP, Gavin Smith, for management of DM2, dx in 39 (39 y/o), insulin-dependent, uncontrolled, wit complications (PN). She saw Gavin Smith >2 years ago, then Gavin Smith at [facility], [place]. Last visit with me 2 mo ago.  DM2: She has a h/o autonomic neuropathy of the GI tract (dx in 1986, when she was 39 y/o - not related to DM - and has severe diarrhea: "food goes through me"), [place]. [place] She had a colostomy 09/2014. No incontinence issues now, but had an infection >> on ABx. She was hospitalized for 8 days >> only had SSI during this time, and sugars were really high.  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 She tells me that Humalog did not work for her >> sugars >200 constantly. She had to have a PA to switch to NovoLog >> now restarted after the last hbA1c returned >> sugars improved. - NovoLog:  - ICR 1:2 (but depending on what she eats) - but 1:1 for potatoes and rice  - target: 170  - ISF: 7.5 -170-200: 4 units, 200-230: 8 units, etc She cannot digest pills. She tried oral meds in the past >> "they went right through me". She also tried Lantus >> this did not work for her >> sugars unchanged despite increasing the dose.  Pt checks her sugars 3x a day and they are (high with ABx): - evening (waking up): 137-162, 201 >> 162-224 - 2h after b'fast: 160-262, 290 >> 183-263 - before lunch:  N/c >> same - 2h after lunch: n/c >>  224-287 - before dinner: 103-224  - 2h after dinner: 250-281 >> 222-384 - bedtime: 257 (sick) >> see above - nighttime: 154, 193, 228 >> 73-110 (takes insulin if high after dinner) No recent lows. Lowest sugar was 73 more recently.; she has hypoglycemia awareness at 60. Highest sugars recently 400s.  Glucometer: One Touch  - no CKD, last BUN/creatinine:  07/23/2014: 12/0.9 On Lisinopril. - last set of lipids: 07/24/2015: 324/868/42/171 AST/ALT: 121/76 Taken off statin b/c hemochromatosis. - last eye exam was in 2014. No Gavin.  - + numbness and tingling in her feet.  She sent to me that she is trying to get the dog trained to recognize hypo-and hyperglycemia.  Vitamin D def.: Vit D 13.9 in 06/2014 despite being on 50,000 IU 2x a week. She tells me insurance stopped covering this.  She was also dx with hemochromatosis, allergy-triggered asthma. Also, HTN, HL, OSA -on CPAP, transaminitis, GERD, depression. She is on Depo-Provera.  ROS: Constitutional: + weight loss, + fatigue, + hot flushes, + poor sleep Eyes: + blurry vision, no xerophthalmia ENT: no sore throat, no nodules palpated in throat, no dysphagia/odynophagia, no hoarseness Cardiovascular: no CP/SOB/no palpitations/leg swelling Respiratory: no cough/SOB Gastrointestinal: + heartburn/+ N/no V/D/C Musculoskeletal: + muscle/+ joint aches Skin: no rashes, + easy bruising Neurological: no tremors/numbness/tingling/dizziness, + HA  Past Medical History  Diagnosis Date  .  Unspecified sleep apnea   . Pityriasis rosea   . Migraine, unspecified, without mention of intractable migraine without mention of status migrainosus   . Insomnia, unspecified   . Allergic rhinitis, cause unspecified   . Generalized osteoarthrosis, involving multiple sites   . Extrinsic asthma, unspecified   . Type I (juvenile type) diabetes mellitus without mention of complication, not stated as uncontrolled   . Pure hypercholesterolemia   . Essential  hypertension, benign    Past Surgical History  Procedure Laterality Date  . Breast reduction surgery  2005  . Spinal fusion  2006    L5-S1 spinal fusion  . Cholecystectomy  2000  . Rhinoplasty  2005   History   Social History  . Marital Status: Single    Spouse Name: N/A  . Number of Children: 0  . Years of Education: 14   Occupational History  . Prev. EMS, now disabled   Social History Main Topics  . Smoking status: Never Smoker   . Smokeless tobacco: Not on file  . Alcohol Use: No  . Drug Use: No   Current Outpatient Prescriptions on File Prior to Visit  Medication Sig Dispense Refill  . albuterol (PROAIR HFA) 108 (90 BASE) MCG/ACT inhaler Inhale 1 puff into the lungs as needed.      Marland Kitchen. albuterol (PROVENTIL) (2.5 MG/3ML) 0.083% nebulizer solution Take 2.5 mg by nebulization as needed.      . B-D ULTRAFINE III SHORT PEN 31G X 8 MM MISC   1  . Cholecalciferol (VITAMIN D) 2000 UNITS CAPS Take 1 capsule by mouth daily.    . insulin aspart (NOVOLOG) 100 UNIT/ML FlexPen Inject into the skin 3 (three) times daily with meals.    . Insulin Pen Needle (NOVOFINE) 32G X 6 MM MISC USE AS DIRECTED Dx Code: 250.01 100 each 3  . Lancets Thin MISC Use as directed     . lisinopril (PRINIVIL,ZESTRIL) 10 MG tablet Take 10 mg by mouth daily.      Marland Kitchen. loratadine (CLARITIN) 10 MG tablet Take 10 mg by mouth daily.      . Melatonin 5 MG TABS Take 1 tablet by mouth at bedtime as needed.     . Multiple Vitamin (MULTI-DAY PO) Take 1 tablet by mouth daily.      . ONE TOUCH ULTRA TEST test strip   0  . sulfamethoxazole-trimethoprim (BACTRIM DS) 800-160 MG per tablet Take 1 tablet by mouth daily.     . traMADol (ULTRAM) 50 MG tablet Take 50 mg by mouth as needed.      . vitamin C (ASCORBIC ACID) 500 MG tablet Take 500 mg by mouth daily.      . Vitamin D, Ergocalciferol, (DRISDOL) 50000 UNITS CAPS capsule Take 50,000 Units by mouth 4 (four) times a week.    . zolpidem (AMBIEN) 10 MG tablet Take 10 mg by  mouth at bedtime as needed.       No current facility-administered medications on file prior to visit.   Allergies  Allergen Reactions  . Fenofibrate Other (See Comments)    Severe leg pain  . Humalog [Insulin Lispro (Human)] Other (See Comments)    Drug tolerance >> ineffectiveness! Pt needs NovoLog instead!  Marland Kitchen. Morphine And Related   . Penicillins    Family History  Problem Relation Age of Onset  . Diabetes Mother   . Hypertension Mother   . Hyperlipidemia Mother   . Hypertension Father   . Hyperlipidemia Father   . Diabetes  Maternal Grandmother   . Cancer Maternal Grandmother     Breast Cancer  . Hypertension Maternal Grandfather   . Heart disease Maternal Grandfather   . Heart disease Paternal Grandfather   . Cancer Other     Ovarian Cancer-Grandparent   PE: BP 118/64 mmHg  Pulse 104  Temp(Src) 98.5 F (36.9 C) (Oral)  Resp 12  Wt 297 lb 12.8 oz (135.081 kg)  SpO2 98% Wt Readings from Last 3 Encounters:  11/28/14 297 lb 12.8 oz (135.081 kg)  09/24/14 302 lb (136.986 kg)  05/31/12 292 lb (132.45 kg)   Constitutional: obese, in NAD Eyes: PERRLA, EOMI, no exophthalmos ENT: moist mucous membranes, no thyromegaly, no cervical lymphadenopathy Cardiovascular: RRR, No MRG Respiratory: CTA B Gastrointestinal: abdomen soft, NT, ND, BS+ Musculoskeletal: no deformities, strength intact in Smith 4 Skin: moist, warm, no rashes Neurological: no tremor with outstretched hands, DTR normal in Smith 4  ASSESSMENT: 1. DM2, insulin-dependent, uncontrolled, with complications - PN  2. Vit D def  PLAN:  1. Patient with long-standing, uncontrolled diabetes, on mealtime insulin only. Pt has had a lot of problems controlling her DM over the years 2/2 impredictability of retaining the food she eats due to her autonomic GI neuropathy. She developed an elaborate system of getting rapid acting insulin in and avoiding lows. This comes with the price of high sugars, however, they have  improved lately, so that last HbA1c was 6.9% in the hospital 1.5 mo ago.  She now has a colostomy >> smaller meals >> will need to adjust regimen. She will start trying to adjust it now that she is off ABx (during ABx tx, sugars were getting higher as the day went by). - I suggested to:   Patient Instructions  Please continue: - NovoLog:  - ICR 1:2 - but 1:1 for potatoes and rice  - target: 170  - ISF: 7.5:    -170-200: 4 units, 200-230: 8 units, etc  Please return in 2 month with your sugar log.   Please stop at the lab.  - continue checking sugars at different times of the day - check 2-3 times a day, rotating checks - given more sugar logs  - she is due for yearly eye exams - Return to clinic in 3 mo with sugar log   2. Vit D def - reviewed previous level >> very low, despite high dose Ergocalciferol.  - will recheck today, if low and need to increase dose of Ergocalciferol to 50,000 IU 4x a week, needs a PA  Office Visit on 11/28/2014  Component Date Value Ref Range Status  . VITD 11/28/2014 19.32* 30.00 - 100.00 ng/mL Final   Vitamin D is still low, at 19. She needs to increase her ergocalciferol 50,000 IU from 2x a week to 4x a week. She told me that she needs a preauthorization for that. Will start one.

## 2014-12-02 DIAGNOSIS — E119 Type 2 diabetes mellitus without complications: Secondary | ICD-10-CM | POA: Diagnosis not present

## 2014-12-04 ENCOUNTER — Other Ambulatory Visit: Payer: Self-pay | Admitting: *Deleted

## 2014-12-04 MED ORDER — VITAMIN D (ERGOCALCIFEROL) 1.25 MG (50000 UNIT) PO CAPS: 50000.0000 [IU] | ORAL_CAPSULE | ORAL | Status: DC

## 2014-12-05 DIAGNOSIS — Z933 Colostomy status: Secondary | ICD-10-CM | POA: Diagnosis not present

## 2014-12-10 DIAGNOSIS — E119 Type 2 diabetes mellitus without complications: Secondary | ICD-10-CM | POA: Diagnosis not present

## 2014-12-30 DIAGNOSIS — E119 Type 2 diabetes mellitus without complications: Secondary | ICD-10-CM | POA: Diagnosis not present

## 2015-01-08 DIAGNOSIS — Z933 Colostomy status: Secondary | ICD-10-CM | POA: Diagnosis not present

## 2015-01-09 ENCOUNTER — Telehealth: Payer: Self-pay | Admitting: Internal Medicine

## 2015-01-09 DIAGNOSIS — Z933 Colostomy status: Secondary | ICD-10-CM | POA: Diagnosis not present

## 2015-01-09 MED ORDER — ACCU-CHEK FASTCLIX LANCETS MISC: Status: DC

## 2015-01-09 MED ORDER — GLUCOSE BLOOD VI STRP: ORAL_STRIP | Status: DC

## 2015-01-09 NOTE — Telephone Encounter (Signed)
Patient called and would like a refill on her Rx  Rx: Accu check nano  Fast click drums    Pharmacy: [facility] [place]  Thank You

## 2015-01-09 NOTE — Telephone Encounter (Signed)
Done

## 2015-01-15 DIAGNOSIS — Z79899 Other long term (current) drug therapy: Secondary | ICD-10-CM | POA: Diagnosis not present

## 2015-01-15 DIAGNOSIS — I10 Essential (primary) hypertension: Secondary | ICD-10-CM | POA: Diagnosis not present

## 2015-01-15 DIAGNOSIS — E780 Pure hypercholesterolemia: Secondary | ICD-10-CM | POA: Diagnosis not present

## 2015-01-15 DIAGNOSIS — J45909 Unspecified asthma, uncomplicated: Secondary | ICD-10-CM | POA: Diagnosis not present

## 2015-01-15 DIAGNOSIS — E1140 Type 2 diabetes mellitus with diabetic neuropathy, unspecified: Secondary | ICD-10-CM | POA: Diagnosis not present

## 2015-01-17 ENCOUNTER — Telehealth: Payer: Self-pay | Admitting: Internal Medicine

## 2015-01-17 ENCOUNTER — Other Ambulatory Visit: Payer: Self-pay

## 2015-01-17 MED ORDER — INSULIN PEN NEEDLE 32G X 6 MM MISC: Status: DC

## 2015-01-17 NOTE — Telephone Encounter (Signed)
Patient called and would like a refill on her Rx  Rx: Pen needles  Pharmacy: [facility], [place]   Thank you

## 2015-01-24 DIAGNOSIS — Z933 Colostomy status: Secondary | ICD-10-CM | POA: Diagnosis not present

## 2015-01-29 DIAGNOSIS — Z933 Colostomy status: Secondary | ICD-10-CM | POA: Diagnosis not present

## 2015-01-31 ENCOUNTER — Telehealth: Payer: Self-pay | Admitting: Internal Medicine

## 2015-01-31 MED ORDER — GLUCOSE BLOOD VI STRP
ORAL_STRIP | Status: DC
Start: 2015-01-31 — End: 2015-03-04

## 2015-01-31 NOTE — Telephone Encounter (Signed)
Patient called and would like for Dr. Elvera LennoxGherghe to be aware that Gavin Smith tests 5x daily   The amount that was prescribed to her is not enough   Please advise the patient   Thank you

## 2015-02-12 DIAGNOSIS — Z933 Colostomy status: Secondary | ICD-10-CM | POA: Diagnosis not present

## 2015-02-18 DIAGNOSIS — G4700 Insomnia, unspecified: Secondary | ICD-10-CM | POA: Diagnosis not present

## 2015-02-27 DIAGNOSIS — G600 Hereditary motor and sensory neuropathy: Secondary | ICD-10-CM | POA: Diagnosis not present

## 2015-02-27 DIAGNOSIS — G629 Polyneuropathy, unspecified: Secondary | ICD-10-CM

## 2015-02-27 DIAGNOSIS — M4806 Spinal stenosis, lumbar region: Secondary | ICD-10-CM | POA: Diagnosis not present

## 2015-02-27 DIAGNOSIS — E1142 Type 2 diabetes mellitus with diabetic polyneuropathy: Secondary | ICD-10-CM | POA: Diagnosis not present

## 2015-02-27 HISTORY — DX: Polyneuropathy, unspecified: G62.9

## 2015-02-28 ENCOUNTER — Ambulatory Visit (INDEPENDENT_AMBULATORY_CARE_PROVIDER_SITE_OTHER): Payer: Medicare Other | Admitting: Internal Medicine

## 2015-02-28 ENCOUNTER — Encounter: Payer: Self-pay | Admitting: Internal Medicine

## 2015-02-28 ENCOUNTER — Other Ambulatory Visit: Payer: Self-pay | Admitting: *Deleted

## 2015-02-28 VITALS — BP 114/78 | HR 113 | Temp 98.8°F | Resp 12 | Wt 304.6 lb

## 2015-02-28 DIAGNOSIS — E1165 Type 2 diabetes mellitus with hyperglycemia: Principal | ICD-10-CM

## 2015-02-28 DIAGNOSIS — E1142 Type 2 diabetes mellitus with diabetic polyneuropathy: Secondary | ICD-10-CM | POA: Diagnosis not present

## 2015-02-28 DIAGNOSIS — IMO0002 Reserved for concepts with insufficient information to code with codable children: Secondary | ICD-10-CM

## 2015-02-28 MED ORDER — VITAMIN D (ERGOCALCIFEROL) 1.25 MG (50000 UNIT) PO CAPS: 50000.0000 [IU] | ORAL_CAPSULE | ORAL | Status: DC

## 2015-02-28 MED ORDER — ACCU-CHEK AVIVA DEVI: Status: DC

## 2015-02-28 MED ORDER — ACCU-CHEK AVIVA CONNECT W/DEVICE KIT: 1.0000 | PACK | Freq: Every day | Status: DC

## 2015-02-28 NOTE — Progress Notes (Signed)
Patient ID: Gavin Smith, male   DOB: October 31, 1975, 39 y.o.   MRN: [id number]  HPI: Gavin Smith is a 39 y.o.-year-old male, initially referred by her PCP, Dr. Theador Hawthorne, for management of DM2, dx in 3 (39 y/o), insulin-dependent, uncontrolled, wit complications (PN). She saw Dr Everardo All >2 years ago, then Dr. Welford Roche at [facility]. Last visit with me 3 mo ago.  She is trying to get the dog trained to recognize hypo-and hyperglycemia. Will get it in 03/2015.   Started Cymbalta for neuropathy. Started Temazepam >> started to sleep at night.   DM2: She has a h/o autonomic neuropathy of the GI tract (dx in 1986, when she was 39 y/o - not related to DM - and has severe diarrhea: "food goes through me"), [place]. [place] She had a colostomy 09/2014. No incontinence issues now, but had an infection >> on ABx. She was hospitalized for 8 days >> only had SSI during this time, and sugars were really high.  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  - NovoLog:  - ICR 1:4 - but 1:2 for potatoes and rice  - target: 170  - ISF: 170-200: 4 units, 200-230: 8 units, 230-and higher: 10 units She cannot digest pills. She tried oral meds in the past >> "they went right through me". She also tried Lantus >> this did not work for her >> sugars unchanged despite increasing the dose.  Pt checks her sugars 3x a day and they are increasing as the day goes by:  - evening (waking up): 137-162, 201 >> 162-224 >> 67 (overcorrected a high), 173-228 - 2h after b'fast: 160-262, 290 >> 183-263 >> n/c -  before lunch:  N/c >> same >> 127-175 - 2h after lunch: n/c >> 224-287 >> 173-272 - before dinner: 103-224 >> 104, 152-226 - 2h after dinner: 250-281 >> 222-384 >> 65, 72, 117-300 - bedtime: 257 (sick) >> see above >> 86, 91-200, 332 - nighttime: 154, 193, 228 >> 73-110 (takes insulin if high after dinner) >> 69-217, 285 No recent lows. Lowest sugar was 73 >> 65 more recently.; she has hypoglycemia awareness at 60. Highest sugars recently 400s >> 394. Glucometer: One Touch  - no CKD, last BUN/creatinine:  07/23/2014: 12/0.9 On Lisinopril. - last set of lipids: 07/24/2015: 324/868/42/171 AST/ALT: 121/76 Taken off statin b/c hemochromatosis. - last eye exam was in 2014. No DR.  - + numbness and tingling in her feet.  Vitamin D def.: Vit D 13.9 in 06/2014 - despite being on Ergocalciferol 50,000 IU 2x a week. Vit D 19.3 in 11/2014 - repeat, on Ergocalciferol 50,000 IU 2x a week  We increased her ergocalciferol 50,000 IU from 2x a week to 4x a week. She started this 4x a week 3 weeks ago.Marland Kitchen  She was also dx with hemochromatosis, allergy-triggered asthma. Also, HTN, HL, OSA -on CPAP, transaminitis, GERD, depression. She is on Depo-Provera.  ROS: Constitutional: no weight loss, + fatigue, + poor sleep, + excessive urination Eyes: + blurry vision, no xerophthalmia ENT: no sore throat, no nodules  palpated in throat, no dysphagia/odynophagia, no hoarseness, + decreased hearing Cardiovascular: no CP/SOB/no palpitations/leg swelling Respiratory: no cough/SOB Gastrointestinal: no heartburn/N/V/+ D/+ C Musculoskeletal: + muscle/+ joint aches Skin: no rashes Neurological: no tremors/numbness/tingling/dizziness  I reviewed pt's medications, allergies, PMH, social hx, family hx, and changes were documented in the history of present illness. Otherwise, unchanged from my initial visit note.  Past Medical History  Diagnosis Date  . Unspecified sleep apnea   . Pityriasis rosea   . Migraine,  unspecified, without mention of intractable migraine without mention of status migrainosus   . Insomnia, unspecified   . Allergic rhinitis, cause unspecified   . Generalized osteoarthrosis, involving multiple sites   . Extrinsic asthma, unspecified   . Type I (juvenile type) diabetes mellitus without mention of complication, not stated as uncontrolled   . Pure hypercholesterolemia   . Essential hypertension, benign    Past Surgical History  Procedure Laterality Date  . Breast reduction surgery  2005  . Spinal fusion  2006    L5-S1 spinal fusion  . Cholecystectomy  2000  . Rhinoplasty  2005   History   Social History  . Marital Status: Single    Spouse Name: N/A  . Number of Children: 0  . Years of Education: 14   Occupational History  . Prev. EMS, now disabled   Social History Main Topics  . Smoking status: Never Smoker   . Smokeless tobacco: Not on file  . Alcohol Use: No  . Drug Use: No   Current Outpatient Prescriptions on File Prior to Visit  Medication Sig Dispense Refill  . ACCU-CHEK FASTCLIX LANCETS MISC Use to test blood sugar 3 times daily. 102 each 2  . albuterol (PROAIR HFA) 108 (90 BASE) MCG/ACT inhaler Inhale 1 puff into the lungs as needed.      Marland Kitchen albuterol (PROVENTIL) (2.5 MG/3ML) 0.083% nebulizer solution Take 2.5 mg by nebulization as needed.      Marland Kitchen atorvastatin (LIPITOR) 10 MG tablet Take 10 mg by mouth every other day.  0  . B-D ULTRAFINE III SHORT PEN 31G X 8 MM MISC   1  . Cholecalciferol (VITAMIN D) 2000 UNITS CAPS Take 1 capsule by mouth daily.    Marland Kitchen glucose blood (ACCU-CHEK SMARTVIEW) test strip Use to test blood sugar 5 times daily as instructed. 150 each 5  . insulin aspart (NOVOLOG) 100 UNIT/ML FlexPen Inject into the skin 3 (three) times daily with meals.    . Insulin Pen Needle (NOVOFINE) 32G X 6 MM MISC USE AS DIRECTED Dx Code: 250.01 100 each 3  . Lancets Thin MISC Use as directed     . lisinopril (PRINIVIL,ZESTRIL) 10 MG tablet Take 10 mg  by mouth daily.      Marland Kitchen loratadine (CLARITIN) 10 MG tablet Take 10 mg by mouth daily.      . Melatonin 5 MG TABS Take 1 tablet by mouth at bedtime as needed.     . Multiple Vitamin (MULTI-DAY PO) Take 1 tablet by mouth daily.      Marland Kitchen sulfamethoxazole-trimethoprim (BACTRIM DS) 800-160 MG per tablet Take 1 tablet by mouth daily.     . traMADol (ULTRAM) 50 MG tablet Take 50 mg by mouth as needed.      . vitamin C (ASCORBIC ACID) 500 MG tablet Take 500 mg by mouth daily.      . Vitamin D, Ergocalciferol, (DRISDOL) 50000 UNITS CAPS capsule Take 1 capsule (50,000 Units total) by mouth 4 (four) times a week. 30  capsule 2  . zolpidem (AMBIEN) 10 MG tablet Take 10 mg by mouth at bedtime as needed.       No current facility-administered medications on file prior to visit.   Allergies  Allergen Reactions  . Fenofibrate Other (See Comments)    Severe leg pain  . Humalog [Insulin Lispro (Human)] Other (See Comments)    Drug tolerance >> ineffectiveness! Pt needs NovoLog instead!  Marland Kitchen Morphine And Related   . Penicillins    Family History  Problem Relation Age of Onset  . Diabetes Mother   . Hypertension Mother   . Hyperlipidemia Mother   . Hypertension Father   . Hyperlipidemia Father   . Diabetes Maternal Grandmother   . Cancer Maternal Grandmother     Breast Cancer  . Hypertension Maternal Grandfather   . Heart disease Maternal Grandfather   . Heart disease Paternal Grandfather   . Cancer Other     Ovarian Cancer-Grandparent   PE: BP 114/78 mmHg  Pulse 113  Temp(Src) 98.8 F (37.1 C) (Oral)  Resp 12  Wt 304 lb 9.6 oz (138.166 kg)  SpO2 96% Body mass index is 46.33 kg/(m^2). Wt Readings from Last 3 Encounters:  02/28/15 304 lb 9.6 oz (138.166 kg)  11/28/14 297 lb 12.8 oz (135.081 kg)  09/24/14 302 lb (136.986 kg)   Constitutional: obese, in NAD Eyes: PERRLA, EOMI, no exophthalmos ENT: moist mucous membranes, no thyromegaly, no cervical lymphadenopathy Cardiovascular: tachycardia,  RR, No MRG Respiratory: CTA B Gastrointestinal: abdomen soft, NT, ND, BS+ Musculoskeletal: no deformities, strength intact in all 4 Skin: moist, warm, no rashes Neurological: no tremor with outstretched hands, DTR normal in all 4  ASSESSMENT: 1. DM2, insulin-dependent, uncontrolled, with complications - PN  Humalog did not work for her >> sugars >200 constantly. She had to have a PA to switch to NovoLog >> sugars improved after this.  2. Vit D def  PLAN:  1. Patient with long-standing, uncontrolled diabetes, on mealtime insulin only. Pt has had a lot of problems controlling her DM over the years 2/2 impredictability of retaining the food she eats due to her autonomic GI neuropathy. She developed an elaborate system of getting rapid acting insulin in and avoiding lows. This comes with the price of high sugars, however, they have improved lately. - She now has a colostomy >> smaller meals but more frequently >> she adjusted her regimen.  - I will also advise her to try a lower target - we discussed about the AccuChek [employer] Expert >> demonstrated use >> given an Rx to try to see if covered by her insurance - I suggested to:   Patient Instructions  Please continue: - NovoLog:  - ICR 1:4 - but 1:2 for potatoes and rice  - target: 170 >> can try 150  - ISF: 170-200: 4 units, 200-230: 8 units, 230-and higher: 10 units  Please return in 3 months with your sugar log.   - continue checking sugars at different times of the day - check 2-3 times a day, rotating checks - given more sugar logs  - check HbA1c today >> 6.9% (excellent!) - she is due for yearly eye exams - Return to clinic in 3 mo with sugar log   2. Vit D def - reviewed previous level >> very low, despite high dose Ergocalciferol.  - we increased dose of Ergocalciferol to 50,000 IU 4x a week >> she just started this 3 weeks ago >> will recheck level at next visit

## 2015-02-28 NOTE — Patient Instructions (Signed)
Please continue: - NovoLog:  - ICR 1:4 - but 1:2 for potatoes and rice  - target: 170 >> can try 150  - ISF: 170-200: 4 units, 200-230: 8 units, 230-and higher: 10 units  Please return in 3 months with your sugar log.

## 2015-03-03 ENCOUNTER — Telehealth: Payer: Self-pay

## 2015-03-03 ENCOUNTER — Telehealth: Payer: Self-pay | Admitting: Internal Medicine

## 2015-03-03 NOTE — Telephone Encounter (Signed)
FYI  The accucheck aviva expert must be requested by [place] only they do not have them in pharmacies  The accucheck connect has the same features and this is the one she got. She now needs the test strips for this one please

## 2015-03-03 NOTE — Telephone Encounter (Signed)
Please read message below. Should Bonita Quin give her a Human resources officer? Please advise if so. Should I send in a rx for the connect in the meantime?

## 2015-03-03 NOTE — Telephone Encounter (Signed)
If Bonita Quin can give her an Expert, let's do that, but let's make sure the strips are covered.  If not, let's send the other ones.

## 2015-03-03 NOTE — Telephone Encounter (Signed)
Call [phone] to obtain the hcp code for her accu chek connect device.

## 2015-03-04 ENCOUNTER — Other Ambulatory Visit: Payer: Self-pay | Admitting: *Deleted

## 2015-03-04 DIAGNOSIS — E109 Type 1 diabetes mellitus without complications: Secondary | ICD-10-CM | POA: Diagnosis not present

## 2015-03-04 DIAGNOSIS — H52223 Regular astigmatism, bilateral: Secondary | ICD-10-CM | POA: Diagnosis not present

## 2015-03-04 MED ORDER — GLUCOSE BLOOD VI STRP: ORAL_STRIP | Status: DC

## 2015-03-04 NOTE — Telephone Encounter (Signed)
Lvm on pt's phone advising her that Bonita Quin, our RN diabetic educator has the [employer] meter in her office and can set her up on it this afternoon or tomorrow. Advised pt to call back so that we can get a time for her to come in.

## 2015-03-13 DIAGNOSIS — Z933 Colostomy status: Secondary | ICD-10-CM | POA: Diagnosis not present

## 2015-03-21 DIAGNOSIS — K7581 Nonalcoholic steatohepatitis (NASH): Secondary | ICD-10-CM | POA: Diagnosis not present

## 2015-03-27 DIAGNOSIS — Z933 Colostomy status: Secondary | ICD-10-CM | POA: Diagnosis not present

## 2015-04-02 DIAGNOSIS — E780 Pure hypercholesterolemia: Secondary | ICD-10-CM | POA: Diagnosis not present

## 2015-04-02 DIAGNOSIS — E559 Vitamin D deficiency, unspecified: Secondary | ICD-10-CM | POA: Diagnosis not present

## 2015-04-02 DIAGNOSIS — E1140 Type 2 diabetes mellitus with diabetic neuropathy, unspecified: Secondary | ICD-10-CM | POA: Diagnosis not present

## 2015-04-02 DIAGNOSIS — I10 Essential (primary) hypertension: Secondary | ICD-10-CM | POA: Diagnosis not present

## 2015-04-02 DIAGNOSIS — G4700 Insomnia, unspecified: Secondary | ICD-10-CM | POA: Diagnosis not present

## 2015-04-10 DIAGNOSIS — G600 Hereditary motor and sensory neuropathy: Secondary | ICD-10-CM | POA: Diagnosis not present

## 2015-04-10 DIAGNOSIS — M4806 Spinal stenosis, lumbar region: Secondary | ICD-10-CM | POA: Diagnosis not present

## 2015-04-10 DIAGNOSIS — E1142 Type 2 diabetes mellitus with diabetic polyneuropathy: Secondary | ICD-10-CM | POA: Diagnosis not present

## 2015-04-15 DIAGNOSIS — Z933 Colostomy status: Secondary | ICD-10-CM | POA: Diagnosis not present

## 2015-04-18 DIAGNOSIS — L719 Rosacea, unspecified: Secondary | ICD-10-CM | POA: Diagnosis not present

## 2015-05-01 DIAGNOSIS — Z933 Colostomy status: Secondary | ICD-10-CM | POA: Diagnosis not present

## 2015-05-20 ENCOUNTER — Other Ambulatory Visit: Payer: Self-pay | Admitting: *Deleted

## 2015-05-20 MED ORDER — ACCU-CHEK FASTCLIX LANCETS MISC: Status: DC

## 2015-06-02 ENCOUNTER — Ambulatory Visit: Payer: Medicare Other | Admitting: Internal Medicine

## 2015-06-20 DIAGNOSIS — Z933 Colostomy status: Secondary | ICD-10-CM | POA: Diagnosis not present

## 2015-06-27 DIAGNOSIS — Z933 Colostomy status: Secondary | ICD-10-CM | POA: Diagnosis not present

## 2015-07-02 DIAGNOSIS — I10 Essential (primary) hypertension: Secondary | ICD-10-CM | POA: Diagnosis not present

## 2015-07-02 DIAGNOSIS — Z6841 Body Mass Index (BMI) 40.0 and over, adult: Secondary | ICD-10-CM | POA: Diagnosis not present

## 2015-07-02 DIAGNOSIS — E1140 Type 2 diabetes mellitus with diabetic neuropathy, unspecified: Secondary | ICD-10-CM | POA: Diagnosis not present

## 2015-07-02 DIAGNOSIS — G4700 Insomnia, unspecified: Secondary | ICD-10-CM | POA: Diagnosis not present

## 2015-07-02 DIAGNOSIS — E7800 Pure hypercholesterolemia, unspecified: Secondary | ICD-10-CM | POA: Diagnosis not present

## 2015-07-04 DIAGNOSIS — Z933 Colostomy status: Secondary | ICD-10-CM | POA: Diagnosis not present

## 2015-07-15 DIAGNOSIS — G600 Hereditary motor and sensory neuropathy: Secondary | ICD-10-CM | POA: Diagnosis not present

## 2015-07-15 DIAGNOSIS — M4806 Spinal stenosis, lumbar region: Secondary | ICD-10-CM | POA: Diagnosis not present

## 2015-07-15 DIAGNOSIS — E1142 Type 2 diabetes mellitus with diabetic polyneuropathy: Secondary | ICD-10-CM | POA: Insufficient documentation

## 2015-07-15 DIAGNOSIS — M48061 Spinal stenosis, lumbar region without neurogenic claudication: Secondary | ICD-10-CM

## 2015-07-15 HISTORY — DX: Type 2 diabetes mellitus with diabetic polyneuropathy: E11.42

## 2015-07-15 HISTORY — DX: Spinal stenosis, lumbar region without neurogenic claudication: M48.061

## 2015-07-16 DIAGNOSIS — M21371 Foot drop, right foot: Secondary | ICD-10-CM | POA: Diagnosis not present

## 2015-08-07 ENCOUNTER — Telehealth: Payer: Self-pay | Admitting: Internal Medicine

## 2015-08-07 NOTE — Telephone Encounter (Signed)
Now the exception form for the different insulin need to go to her primary care MD so we can disregard

## 2015-08-07 NOTE — Telephone Encounter (Signed)
Patient stated that her body can not tolerate the medication Humalog only the Novalog need Dr Elvera LennoxGherghe to send a form to her insurance Co. stating that she can only take the Novalog. Please advise Waiting for patient to call with fax #

## 2015-08-12 ENCOUNTER — Encounter: Payer: Self-pay | Admitting: Internal Medicine

## 2015-08-12 ENCOUNTER — Ambulatory Visit (INDEPENDENT_AMBULATORY_CARE_PROVIDER_SITE_OTHER): Payer: Medicare Other | Admitting: Internal Medicine

## 2015-08-12 ENCOUNTER — Other Ambulatory Visit (INDEPENDENT_AMBULATORY_CARE_PROVIDER_SITE_OTHER): Payer: Medicare Other | Admitting: *Deleted

## 2015-08-12 ENCOUNTER — Other Ambulatory Visit: Payer: Self-pay | Admitting: *Deleted

## 2015-08-12 ENCOUNTER — Other Ambulatory Visit: Payer: Medicare Other | Admitting: *Deleted

## 2015-08-12 VITALS — BP 118/66 | HR 112 | Temp 98.5°F | Resp 12 | Wt 318.0 lb

## 2015-08-12 DIAGNOSIS — E1142 Type 2 diabetes mellitus with diabetic polyneuropathy: Secondary | ICD-10-CM | POA: Diagnosis not present

## 2015-08-12 DIAGNOSIS — E1165 Type 2 diabetes mellitus with hyperglycemia: Principal | ICD-10-CM

## 2015-08-12 DIAGNOSIS — IMO0002 Reserved for concepts with insufficient information to code with codable children: Secondary | ICD-10-CM

## 2015-08-12 DIAGNOSIS — E559 Vitamin D deficiency, unspecified: Secondary | ICD-10-CM | POA: Diagnosis not present

## 2015-08-12 LAB — MICROALBUMIN / CREATININE URINE RATIO
CREATININE, U: 305.4 mg/dL
MICROALB UR: 1.5 mg/dL (ref 0.0–1.9)
Microalb Creat Ratio: 0.5 mg/g (ref 0.0–30.0)

## 2015-08-12 LAB — VITAMIN D 25 HYDROXY (VIT D DEFICIENCY, FRACTURES): VITD: 23.86 ng/mL — AB (ref 30.00–100.00)

## 2015-08-12 LAB — POCT GLYCOSYLATED HEMOGLOBIN (HGB A1C)
Hemoglobin A1C: 6.9
Hemoglobin A1C: 8

## 2015-08-12 MED ORDER — VITAMIN D (ERGOCALCIFEROL) 1.25 MG (50000 UNIT) PO CAPS: 50000.0000 [IU] | ORAL_CAPSULE | Freq: Every day | ORAL | Status: DC

## 2015-08-12 MED ORDER — INSULIN DEGLUDEC 100 UNIT/ML ~~LOC~~ SOPN: 12.0000 [IU] | PEN_INJECTOR | Freq: Every day | SUBCUTANEOUS | Status: DC

## 2015-08-12 MED ORDER — INSULIN REGULAR HUMAN 100 UNIT/ML IJ SOLN: 10.0000 [IU] | Freq: Three times a day (TID) | INTRAMUSCULAR | Status: DC

## 2015-08-12 NOTE — Patient Instructions (Signed)
Please start tresiba 12 units in am.  Continue Novolog (use Regular insulin until you get Novolog; please inject 30 min before a meal):  - ICR 1:4 - but 1:2 for potatoes and rice  - target: 150  - ISF: 150-200: 4 units, 200-230: 8 units, 230-and higher: 10 units  Please return in 1.5 months with your sugar log.   Please stop at the lab.

## 2015-08-12 NOTE — Progress Notes (Addendum)
Patient ID: Gavin Smith, male   DOB: [birth year], 40 y.o.   MRN: [id number]  HPI: Gavin Smith is a 40 y.o.-year-old male, initially referred by her PCP, Dr. Shirleen Schirmer, for management of DM2, dx in [birth year] (40 y/o), insulin-dependent, uncontrolled, wit complications (PN). She saw Dr Loanne Drilling >2 years ago, then Dr. Howell Rucks at [facility]. Last visit with me 5 mo ago. Now seeing Dr. Jenean Lindau.  She has LAD (lymphadenitis). She has no appetite, hot flushes, feels sleepy.  She now has a dog trained to detect hypoglycemia.  DM2: Reviewed hx: She has a h/o autonomic neuropathy of the GI tract (dx in 1986, when she was 40 y/o - not related to DM - and has severe diarrhea: "food goes through me"), [place]. [place] She had a colostomy 09/2014. No incontinence issues now, but had an infection >> on ABx.  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 However, sugars mostly in the 200s lately as she is not feeling well >> sleeps more >> no short-acting insulin. She usually uses: - NovoLog:  - ICR 1:4 - but 1:2 for potatoes and rice  - target: 150  - ISF: 150-200: 4 units, 200-230: 8 units, 230-and higher: 10 units She cannot digest pills. She tried oral meds in the past >> "they went right through me". She also tried Lantus >> this did not work for her >> sugars unchanged despite increasing the dose.  She uses an Financial controller (needs my HCP code).  Pt checks her sugars 3x a day - 200-390 (no pattern)  -no log or meter. Reviewed sugars from last  visit: - evening (waking up): 137-162, 201 >> 162-224 >> 67 (overcorrected a high), 173-228  - 2h after b'fast: 160-262, 290 >> 183-263 >> n/c - before lunch:  N/c >> same >> 127-175 - 2h after lunch: n/c >> 224-287 >> 173-272 - before dinner: 103-224 >> 104, 152-226 - 2h after dinner: 250-281 >> 222-384 >> 65, 72, 117-300 - bedtime: 257 (sick) >> see above >> 86, 91-200, 332 - nighttime: 154, 193, 228 >> 73-110 (takes insulin if high after dinner) >> 69-217, 285 + recent lows - 43 x1 - dog woke her up. One other low at 73; she has hypoglycemia awareness at 67. Highest sugars recently 390s.  Glucometer: One Touch  - no CKD, last BUN/creatinine:  07/23/2014: 12/0.9 On Lisinopril. - last set of lipids: 07/23/2014: 324/868/42/171 AST/ALT: 121/76 Taken off statin b/c hemochromatosis. - last eye exam was in 2014. No DR.  - + numbness and tingling in her feet. On Cymbalta for neuropathy.   Vitamin D def.: Vit D 13.9 in 06/2014 - despite being on Ergocalciferol 50,000 IU 2x a week. Vit D 19.3 in 11/2014 - repeat, on Ergocalciferol 50,000 IU 2x a week  We increased her ergocalciferol 50,000 IU from 2x a week to 4x a week. She is now on 4x a week Ergocalciferol.  She was also dx with hemochromatosis, allergy-triggered asthma. Also, HTN, HL, OSA -on CPAP, transaminitis, GERD, depression. She is  on Depo-Provera.  ROS: Constitutional:+ weight gain, + fatigue, + hot flushes, + nocturia Eyes: + blurry vision, no xerophthalmia ENT: no sore throat, no nodules palpated in throat, + dysphagia/no odynophagia, no hoarseness Cardiovascular: no CP/SOB/no palpitations/leg swelling Respiratory: no cough/SOB Gastrointestinal: no heartburn/N/V/D/C Musculoskeletal: no muscle/joint aches Skin: no rashes Neurological: no tremors/numbness/tingling/dizziness, + HA  I reviewed pt's medications, allergies, PMH, social hx, family hx, and changes were documented in the history of present illness. Otherwise,  unchanged from my initial visit note. She stopped Temazepam. Also started Amitriptyline.  Past Medical History  Diagnosis Date  . Unspecified sleep apnea   . Pityriasis rosea   . Migraine, unspecified, without mention of intractable migraine without mention of status migrainosus   . Insomnia, unspecified   . Allergic rhinitis, cause unspecified   . Generalized osteoarthrosis, involving multiple sites   . Extrinsic asthma, unspecified   . Type I (juvenile type) diabetes mellitus without mention of complication, not stated as uncontrolled   . Pure hypercholesterolemia   . Essential hypertension, benign    Past Surgical History  Procedure Laterality Date  . Breast reduction surgery  2005  . Spinal fusion  2006    L5-S1 spinal fusion  . Cholecystectomy  2000  . Rhinoplasty  2005   History   Social History  . Marital Status: Single    Spouse Name: N/A  . Number of Children: 0  . Years of Education: 14   Occupational History  . Prev. EMS, now disabled   Social History Main Topics  . Smoking status: Never Smoker   . Smokeless tobacco: Not on file  . Alcohol Use: No  . Drug Use: No   Current Outpatient Prescriptions on File Prior to Visit  Medication Sig Dispense Refill  . ACCU-CHEK FASTCLIX LANCETS MISC Use to test blood sugar 3 times daily. 102 each 4  . albuterol (PROAIR HFA) 108 (90 BASE) MCG/ACT inhaler Inhale 1 puff into the lungs as needed.      . albuterol (PROVENTIL) (2.5 MG/3ML) 0.083% nebulizer solution Take 2.5 mg by nebulization as needed.      . atorvastatin (LIPITOR) 10 MG tablet Take 10 mg by mouth every other day.  0  . B-D ULTRAFINE III SHORT PEN 31G X 8 MM MISC   1  . Blood Glucose Monitoring Suppl (ACCU-CHEK AVIVA CONNECT) W/DEVICE KIT 1 each by Does not apply route daily. Dx: E11.42 1 kit 0  . Cholecalciferol (VITAMIN D) 2000 UNITS CAPS Take 1 capsule by mouth daily.    . DULoxetine (CYMBALTA) 30 MG capsule Take 30 mg by mouth.    . glucose blood  (ACCU-CHEK AVIVA PLUS) test strip Use to test blood sugar 5 times daily as instructed. 150 each 11  . insulin aspart (NOVOLOG) 100 UNIT/ML FlexPen Inject into the skin 3 (three) times daily with meals.    . Insulin Pen Needle (NOVOFINE) 32G X 6 MM MISC USE AS DIRECTED Dx Code: 250.01 100 each 3  . lisinopril (PRINIVIL,ZESTRIL) 10 MG tablet Take 10 mg by mouth daily.      . loratadine (CLARITIN) 10 MG tablet Take 10 mg by mouth daily.      . Melatonin 5 MG TABS Take 1 tablet by mouth at bedtime as needed.     . Multiple Vitamin (MULTI-DAY PO) Take 1 tablet by mouth daily.      . sulfamethoxazole-trimethoprim (BACTRIM DS) 800-160 MG per tablet Take 1 tablet by mouth daily.     . traMADol (ULTRAM)   50 MG tablet Take 50 mg by mouth as needed.      . vitamin C (ASCORBIC ACID) 500 MG tablet Take 500 mg by mouth daily.      . Vitamin D, Ergocalciferol, (DRISDOL) 50000 UNITS CAPS capsule Take 1 capsule (50,000 Units total) by mouth 4 (four) times a week. 20 capsule 5  . zolpidem (AMBIEN) 10 MG tablet Take 10 mg by mouth at bedtime as needed.       No current facility-administered medications on file prior to visit.   Allergies  Allergen Reactions  . Fenofibrate Other (See Comments)    Severe leg pain  . Humalog [Insulin Lispro] Other (See Comments)    Drug tolerance >> ineffectiveness! Pt needs NovoLog instead!  . Morphine And Related   . Penicillins    Family History  Problem Relation Age of Onset  . Diabetes Mother   . Hypertension Mother   . Hyperlipidemia Mother   . Hypertension Father   . Hyperlipidemia Father   . Diabetes Maternal Grandmother   . Cancer Maternal Grandmother     Breast Cancer  . Hypertension Maternal Grandfather   . Heart disease Maternal Grandfather   . Heart disease Paternal Grandfather   . Cancer Other     Ovarian Cancer-Grandparent   PE: BP 118/66 mmHg  Pulse 112  Temp(Src) 98.5 F (36.9 C) (Oral)  Resp 12  Wt 318 lb (144.244 kg)  SpO2 97% Body mass  index is 48.36 kg/(m^2). Wt Readings from Last 3 Encounters:  08/12/15 318 lb (144.244 kg)  02/28/15 304 lb 9.6 oz (138.166 kg)  11/28/14 297 lb 12.8 oz (135.081 kg)   Constitutional: obese, in NAD; she is here with her dog  Eyes: PERRLA, EOMI, no exophthalmos ENT: moist mucous membranes, no thyromegaly, no cervical lymphadenopathy Cardiovascular: tachycardia, RR, No MRG Respiratory: CTA B Gastrointestinal: abdomen soft, NT, ND, BS+ Musculoskeletal: no deformities, strength intact in all 4 Skin: moist, warm, no rashes Neurological: no tremor with outstretched hands, DTR normal in all 4  ASSESSMENT: 1. DM2, insulin-dependent, uncontrolled, with complications - PN  Humalog did not work for her >> sugars >200 constantly. She had to have a PA to switch to NovoLog >> sugars improved after this.  2. Vit D def  PLAN:  1. Patient with long-standing, uncontrolled diabetes, on mealtime insulin only. Pt has had a lot of problems controlling her DM over the years 2/2 impredictability of retaining the food she eats due to her autonomic GI neuropathy. She has a colostomy >> smaller meals but more frequently >> she adjusted her regimen. She developed an elaborate system of getting rapid acting insulin in and avoiding lows. This comes with the price of high sugars. These are worse lately  - staying hyperglycemic most of the time. I again suggested basal insulin, which she agrees to try now >> will try Tresiba. Would not want to retry Lantus as this raised her sugars in the past. - I also gave her aRx for Regular insulin vial as she runs out of NovoLog >> she apparently needs a PA from PCP for the insurance...  - I suggested to:  Patient Instructions  Please start Tresiba 12 units in am.  Continue Novolog (use Regular insulin until you get Novolog; please inject 30 min before a meal):  - ICR 1:4 - but 1:2 for potatoes and rice  - target: 150  - ISF: 150-200: 4 units, 200-230: 8 units, 230-and  higher: 10 units  Please return in 1.5   months with your sugar log.   Please stop at the lab.  - continue checking sugars at different times of the day - check 2-3 times a day, rotating checks - check HbA1c today >> 8.0% (higher) - she is due for yearly eye exams - Return to clinic in 1.5 mo with sugar log   2. Vit D def - reviewed previous level >> very low, despite high dose Ergocalciferol.  - we increased dose of Ergocalciferol to 50,000 IU 4x a week >> will recheck today  - time spent with the patient: 40 min, of which >50% was spent in obtaining information about her symptoms, reviewing her previous labs, blood sugar levels and insulin regimens - this is a complicated pt needing an unusual treatment regimen; we also addressed her vit D def.; she had a number of questions which I addressed.   Component     Latest Ref Rng 08/12/2015  Microalb, Ur     0.0 - 1.9 mg/dL 1.5  Creatinine,U      305.4  MICROALB/CREAT RATIO     0.0 - 30.0 mg/g 0.5  VITD     30.00 - 100.00 ng/mL 23.86 (L)   Vit D still low >> will increase the Ergocalciferol to daily and recheck level at next visit Addendum: Tresiba not covered by her insurance >> will try Basaglar.  Addendum: Received labs from PCP, drawn on 07/02/2015: - CBC normal with the exception of increased white blood cells: 12.1 (3.4-10.8) and neutrophiles 8.1 (1.4-7) - Lipids: 206/229/63/97 - CMP: Normal, except glucose 216, BUN/creatinine 10/0.81, GFR 93, elevated ALT at 39 (0-32), elevated bilirubin 1.3 (0-1.2) - TSH 1.49 

## 2015-08-13 ENCOUNTER — Other Ambulatory Visit: Payer: Self-pay | Admitting: *Deleted

## 2015-08-13 MED ORDER — INSULIN GLARGINE 100 UNIT/ML SOLOSTAR PEN: 12.0000 [IU] | PEN_INJECTOR | SUBCUTANEOUS | Status: DC

## 2015-08-13 MED ORDER — INSULIN DETEMIR 100 UNIT/ML FLEXPEN: 12.0000 [IU] | PEN_INJECTOR | SUBCUTANEOUS | Status: DC

## 2015-08-13 NOTE — Telephone Encounter (Signed)
Ins will not cover basaglar. Dr Elvera Lennox advised to send Levemir.

## 2015-08-18 ENCOUNTER — Other Ambulatory Visit: Payer: Self-pay | Admitting: *Deleted

## 2015-08-18 MED ORDER — "INSULIN SYRINGE-NEEDLE U-100 31G X 5/16"" 0.3 ML MISC": Status: DC

## 2015-08-20 ENCOUNTER — Telehealth: Payer: Self-pay | Admitting: Internal Medicine

## 2015-08-20 NOTE — Telephone Encounter (Signed)
Patient called stating that her insurance will not cover her Novolog  If she is needing to go back on Humalog or Humalin she will need to take a lot more   Please advise patient    Thank you

## 2015-08-20 NOTE — Telephone Encounter (Signed)
Called pt and advised her that we have completed a  PA for the Novolog, it was faxed yesterday and we will contact her as soon as we hear something. Pt voiced understanding.

## 2015-09-10 ENCOUNTER — Telehealth: Payer: Self-pay | Admitting: Internal Medicine

## 2015-09-10 NOTE — Telephone Encounter (Signed)
Pt said she called a couple of days ago and has not heard back yet about getting her insulin filled for a bigger quantity because what she has now will not last her very long.

## 2015-09-11 NOTE — Telephone Encounter (Signed)
Pt states that she did increase her insulin slowly. She is correcting when she eats carbs. So she is injecting all day long, it seems. I feel that Dr Elvera Lennox may need to call pt and discuss.

## 2015-09-11 NOTE — Telephone Encounter (Signed)
Increase Levemir to 40 first.

## 2015-09-11 NOTE — Telephone Encounter (Signed)
She needs to increase [place] - did she get this? If not, we absolutely need basal insulin at this point, as I advised her at last visit.

## 2015-09-11 NOTE — Telephone Encounter (Signed)
Pt returned call. Pt stated that she is taking between 180-200 units of the Humulin R daily due to making corrections and her ICR. Pt stated her blood sugars are still in the 200's even when she is not eating. Please review and advise.

## 2015-09-11 NOTE — Telephone Encounter (Signed)
She needs to increase the dose. I am not sure what dose she is on now.Marland KitchenMarland Kitchen

## 2015-09-11 NOTE — Telephone Encounter (Signed)
Called pt and lvm advising her to return call to let me know which insulin she needs taken care of.

## 2015-09-11 NOTE — Telephone Encounter (Signed)
No, usually the total daily dose of long-acting insulin need to be approximately the same to the total daily dose of short-acting insulin. If she is taking 180-200 units of insulin R and only 30 units of N, this is probably the problem. Please try to increase DM slowly and follow the sugars and let me know about them in few days.

## 2015-09-11 NOTE — Telephone Encounter (Signed)
Ins would not cover [place] or [employer]. She is on Levemir. Please advise.

## 2015-09-11 NOTE — Telephone Encounter (Signed)
Pt is taking 30 units of Levemir at bedtime. Her fasting glucose is 138 this morning. Pt feels she just needs more of the Humulin R. Please advise.

## 2015-09-11 NOTE — Telephone Encounter (Signed)
Called pt and advised her per Dr Charlean Sanfilippo message. Pt to call back on Monday with her sugar readings.

## 2015-09-16 ENCOUNTER — Other Ambulatory Visit: Payer: Self-pay | Admitting: Endocrinology

## 2015-09-17 DIAGNOSIS — Z933 Colostomy status: Secondary | ICD-10-CM | POA: Diagnosis not present

## 2015-09-24 ENCOUNTER — Telehealth: Payer: Self-pay | Admitting: *Deleted

## 2015-09-24 NOTE — Telephone Encounter (Signed)
Pt had called and lvm with elevated b/s readings. Advised Dr Elvera Lennox. She asked me to schedule her to come in on 09/25/15 (tomorrow) and be seen. Spoke with pt and scheduled her to be seen tomorrow.

## 2015-09-25 ENCOUNTER — Ambulatory Visit (INDEPENDENT_AMBULATORY_CARE_PROVIDER_SITE_OTHER): Payer: Medicare Other | Admitting: Internal Medicine

## 2015-09-25 ENCOUNTER — Encounter: Payer: Self-pay | Admitting: Internal Medicine

## 2015-09-25 VITALS — BP 126/80 | HR 124 | Temp 98.3°F | Resp 14 | Wt 313.0 lb

## 2015-09-25 DIAGNOSIS — E1142 Type 2 diabetes mellitus with diabetic polyneuropathy: Secondary | ICD-10-CM | POA: Diagnosis not present

## 2015-09-25 DIAGNOSIS — E1165 Type 2 diabetes mellitus with hyperglycemia: Secondary | ICD-10-CM | POA: Diagnosis not present

## 2015-09-25 DIAGNOSIS — Z933 Colostomy status: Secondary | ICD-10-CM | POA: Diagnosis not present

## 2015-09-25 DIAGNOSIS — IMO0002 Reserved for concepts with insufficient information to code with codable children: Secondary | ICD-10-CM

## 2015-09-25 MED ORDER — INSULIN DETEMIR 100 UNIT/ML FLEXPEN: 40.0000 [IU] | PEN_INJECTOR | SUBCUTANEOUS | Status: DC

## 2015-09-25 MED ORDER — INSULIN LISPRO 200 UNIT/ML ~~LOC~~ SOPN: 50.0000 [IU] | PEN_INJECTOR | Freq: Four times a day (QID) | SUBCUTANEOUS | Status: DC

## 2015-09-25 NOTE — Progress Notes (Signed)
Patient ID: Gavin Smith, male   DOB: [birth year], 40 y.o.   MRN: [id number]  HPI: Gavin Smith is a 40 y.o.-year-old male, initially referred by her PCP, Dr. Shirleen Schirmer, for management of DM2, dx in 40 (40 y/o), insulin-dependent, uncontrolled, wit complications (PN). She saw Dr Loanne Drilling >2 years ago, then Dr. Howell Rucks at [facility]. Last visit with me 5 mo ago. Now seeing Dr. Jenean Lindau.  She called with high sugars in the 300s-400s lately >> advised to come for an appt today.  DM2: Reviewed hx: She has a h/o autonomic neuropathy of the GI tract (dx in 1986, when she was 40 y/o - not related to DM - and has severe diarrhea: "food goes through me"), seeing Dr. [place]. She had a colostomy 09/2014. No incontinence issues now, but had an infection >> on ABx.  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 However, her GI status improved recently. At last visit, she was telling me that she was able to eat a little be better than before.   However, sugars started to increase as she is only taking 10 units of Humulin before every meal. In the past, she took only short-acting insulin, up to 180-200 units a day. However, at last visit, I sent one vial of Humulin to the pharmacy to last her only until she gets NovoLog back. Because she was limited in the amount of insulin that she had to take, she was only taking 10 units of insulin with meals. This is obviously not enough, since her sugars are between 300-400 most of the time.  She usually uses: -  Levemir 40 units at bedtime - NovoLog >> Humulin vial:  - ICR 1:4 - but 1:2 for potatoes and rice  - target: 150  - ISF: 150-200: 4 units, 200-230: 8 units, 230-and higher: 10 units She cannot digest pills. She tried oral meds in the past >> "they went right through me". She also tried Lantus >> this did not work for her >> sugars unchanged despite increasing the dose.  She has tried Humulin and Humalog before and these are not effective for her.  She uses an Financial controller (she needed my HCP code to use this).  Pt checks her sugars 3x a day - sugars very high, with an average of 364 (lowest 204, highest 493): - evening (waking up): 137-162, 201 >> 162-224 >> 67 (overcorrected a high), 173-228  >> 317, 401 - 2h after b'fast: 160-262, 290 >> 183-263 >> n/c - before lunch:  N/c >> same >> 127-175 >> 383 - 2h after lunch: n/c >> 224-287 >> 173-272 >> 204-405 - before dinner: 103-224 >> 104, 152-226 >> 311-493 - 2h after dinner: 250-281 >> 222-384 >> 65, 72, 117-300 >> 292-391 - bedtime: 257 (sick) >> see above >> 86, 91-200, 332 >> see above - nighttime: 154, 193, 228 >> 73-110 (takes insulin if high after dinner) >> 69-217, 285 >> n/c + recent lows - 43 x1 - dog woke her up. No lows  since last visit; she has hypoglycemia awareness at 80. Highest sugars recently 390s.  - no CKD: 07/02/2015: CMP: Normal, except glucose 216, BUN/creatinine 10/0.81, GFR 93, ALT 39 (0-32), bilirubin 1.3 (0-1.2) 07/23/2014: BUN/creatinine 12/0.9 Component     Latest Ref Rng 08/12/2015  Microalb, Ur     0.0 - 1.9 mg/dL 1.5  Creatinine,U      305.4  MICROALB/CREAT RATIO     0.0 - 30.0 mg/g 0.5   On Lisinopril. - last set of lipids: 07/02/2015: 206/229/63/97  07/23/2014: 324/868/42/171 AST/ALT: 121/76 Taken off statin b/c hemochromatosis. - last eye exam was in 2014. No DR.  - + numbness and tingling in her feet. On Cymbalta for neuropathy.   Vitamin D def.: Vit D 13.9 in 06/2014 - despite  being on Ergocalciferol 50,000 IU 2x a week. Vit D 19.3 in 11/2014 - repeat, on Ergocalciferol 50,000 IU 2x a week >> dose increased to 4 times a week Vit D 23.86 in 07/2015 - on ergocalciferol 50,000 IU 4x a week >> dose increased to daily   She is now taking the high-dose vitamin D daily.   Other labs received from PCP, drawn on 07/02/2015: - Lipids: 206/229/63/97 - TSH 1.49  She was also dx with hemochromatosis, allergy-triggered asthma. Also, HTN, HL, OSA -on CPAP, transaminitis, GERD, depression. She is on Depo-Provera.  ROS: Constitutional: no weight gain, + fatigue, + hot flushes, + nocturia Eyes: + blurry vision, no xerophthalmia ENT: no sore throat, no nodules palpated in throat, + dysphagia/no odynophagia, no hoarseness Cardiovascular: no CP/ +SOB/no palpitations/leg swelling Respiratory: no cough/ +SOB Gastrointestinal: no heartburn/+ N/+ V/+ D/no C Musculoskeletal: + muscle/+ joint aches Skin: no rashes Neurological: no tremors/numbness/tingling/dizziness, + HA  I reviewed pt's medications, allergies, PMH, social hx, family hx, and changes were documented in the history of present illness. Otherwise, unchanged from my initial visit note. She stopped Temazepam. Also started Amitriptyline.  Past Medical History  Diagnosis Date  . Unspecified sleep apnea   . Pityriasis rosea   . Migraine, unspecified, without mention of intractable migraine without mention of status migrainosus   . Insomnia, unspecified   . Allergic rhinitis, cause unspecified   . Generalized osteoarthrosis, involving multiple sites   . Extrinsic asthma, unspecified   . Type I (juvenile type) diabetes mellitus without mention of complication, not stated as uncontrolled   . Pure hypercholesterolemia   . Essential hypertension, benign    Past Surgical History  Procedure Laterality Date  . Breast reduction surgery  2005  . Spinal fusion  2006    L5-S1 spinal fusion  . Cholecystectomy  2000  .  Rhinoplasty  2005   History   Social History  . Marital Status: Single    Spouse Name: N/A  . Number of Children: 0  . Years of Education: 14   Occupational History  . Prev. EMS, now disabled   Social History Main Topics  . Smoking status: Never Smoker   . Smokeless tobacco: Not on file  . Alcohol Use: No  . Drug Use: No   Current Outpatient Prescriptions on File Prior to Visit  Medication Sig Dispense Refill  . ACCU-CHEK FASTCLIX LANCETS MISC Use to test blood sugar 3 times daily. 102 each 4  . albuterol (PROAIR HFA) 108 (90 BASE) MCG/ACT inhaler Inhale 1 puff into the lungs as needed.      Marland Kitchen albuterol (PROVENTIL) (2.5 MG/3ML) 0.083% nebulizer solution Take 2.5 mg by nebulization as needed.      Marland Kitchen amitriptyline (  ELAVIL) 25 MG tablet Take 25 mg by mouth.    Marland Kitchen atorvastatin (LIPITOR) 10 MG tablet Take 10 mg by mouth every other day.  0  . B-D ULTRAFINE III SHORT PEN 31G X 8 MM MISC   1  . B-D ULTRAFINE III SHORT PEN 31G X 8 MM MISC USE FOR 5 INJECTIONS DAILY 100 each 3  . Blood Glucose Monitoring Suppl (ACCU-CHEK AVIVA CONNECT) W/DEVICE KIT 1 each by Does not apply route daily. Dx: E11.42 1 kit 0  . Cholecalciferol (VITAMIN D) 2000 UNITS CAPS Take 1 capsule by mouth daily.    . DULoxetine (CYMBALTA) 30 MG capsule Take 30 mg by mouth.    Marland Kitchen glucose blood (ACCU-CHEK AVIVA PLUS) test strip Use to test blood sugar 5 times daily as instructed. 150 each 11  . Insulin Detemir (LEVEMIR FLEXTOUCH) 100 UNIT/ML Pen Inject 12 Units into the skin every morning. (Patient taking differently: Inject 40 Units into the skin every morning. ) 15 mL 2  . insulin regular (NOVOLIN R,HUMULIN R) 100 units/mL injection Inject 0.1 mLs (10 Units total) into the skin 3 (three) times daily before meals. 10 mL 11  . Insulin Syringe-Needle U-100 31G X 5/16" 0.3 ML MISC Use to inject insulin 3 times daily as directed. 100 each 5  . lisinopril (PRINIVIL,ZESTRIL) 10 MG tablet Take 10 mg by mouth daily.      Marland Kitchen  loratadine (CLARITIN) 10 MG tablet Take 10 mg by mouth daily.      . Melatonin 5 MG TABS Take 1 tablet by mouth at bedtime as needed.     . Multiple Vitamin (MULTI-DAY PO) Take 1 tablet by mouth daily.      Marland Kitchen sulfamethoxazole-trimethoprim (BACTRIM DS) 800-160 MG per tablet Take 1 tablet by mouth daily.     . traMADol (ULTRAM) 50 MG tablet Take 50 mg by mouth as needed.      . vitamin C (ASCORBIC ACID) 500 MG tablet Take 500 mg by mouth daily.      . Vitamin D, Ergocalciferol, (DRISDOL) 50000 units CAPS capsule Take 1 capsule (50,000 Units total) by mouth daily. 30 capsule 5  . zolpidem (AMBIEN) 10 MG tablet Take 10 mg by mouth at bedtime as needed.       No current facility-administered medications on file prior to visit.   Allergies  Allergen Reactions  . Fenofibrate Other (See Comments)    Severe leg pain  . Humalog [Insulin Lispro] Other (See Comments)    Drug tolerance >> ineffectiveness! Pt needs NovoLog instead!  Marland Kitchen Morphine And Related   . Penicillins    Family History  Problem Relation Age of Onset  . Diabetes Mother   . Hypertension Mother   . Hyperlipidemia Mother   . Hypertension Father   . Hyperlipidemia Father   . Diabetes Maternal Grandmother   . Cancer Maternal Grandmother     Breast Cancer  . Hypertension Maternal Grandfather   . Heart disease Maternal Grandfather   . Heart disease Paternal Grandfather   . Cancer Other     Ovarian Cancer-Grandparent   PE: BP 126/80 mmHg  Pulse 124  Temp(Src) 98.3 F (36.8 C) (Oral)  Resp 14  Wt 313 lb (141.976 kg)  SpO2 97% Body mass index is 47.6 kg/(m^2). Wt Readings from Last 3 Encounters:  09/25/15 313 lb (141.976 kg)  08/12/15 318 lb (144.244 kg)  02/28/15 304 lb 9.6 oz (138.166 kg)   Constitutional: obese, in NAD; she is here with her dog (  Merrell) Eyes: PERRLA, EOMI, no exophthalmos ENT: moist mucous membranes, no thyromegaly, no cervical lymphadenopathy Cardiovascular: tachycardia, RR, No MRG Respiratory:  CTA B Gastrointestinal: abdomen soft, NT, ND, BS+ Musculoskeletal: no deformities, strength intact in all 4 Skin: moist, warm, no rashes Neurological: no tremor with outstretched hands, DTR normal in all 4  ASSESSMENT: 1. DM2, insulin-dependent, uncontrolled, with complications - PN  Humalog did not work for her >> sugars >200 constantly. She had to have a PA to switch to NovoLog >> sugars improved after this.  2. Vit D def  PLAN:  1. Patient with long-standing, uncontrolled diabetes, on mealtime insulin and now basal insulin, with poor control, as she has decreased her mealtime insulin dosing. Pt has had a lot of problems controlling her DM over the years 2/2 impredictability of retaining the food she eats due to her autonomic GI neuropathy. She now has a colostomy >> smaller meals but more frequently >> she did adjust the regimen but sugars stay higher. After our last visit, she decreased the doses since I only sent the vial of insulin to her pharmacy (this was intended only until she gets back NovoLog, but she was not given additional vials so she had to stay with the lower doses)  - advised this visit, I advised her to continue the Levemir, and start Humalog pens for now but I plan to resend the PA for NovoLog for her, since Humalog and Humulin are not very effective for her. (this was sent in 07/2015, but insurance did not reply) - I suggested to:  Patient Instructions  Please continue Levemir 40 units at bedtime.  Start Humalog U200:  - ICR 1:4 - but 1:2 for potatoes and rice  - target: 150  - ISF: 150-200: 4 units, 200-230: 8 units, 230-and higher: 10 units  Please return in 1.5 months with your sugar log.   - continue checking sugars at different times of the day - check 2-3 times a day, rotating checks - Reviewed last hemoglobin A1c, which was higher, at 8%  - she is due for yearly eye exams - Return to clinic in 1.5 mo with sugar log   2. Vit D def - reviewed previous  level improved >> we increased  the dose of Ergocalciferol to 50,000 IU daily >> will rechelevel at next visit

## 2015-09-25 NOTE — Patient Instructions (Signed)
Please continue Levemir 40 units at bedtime.  Start Humalog U200:  - ICR 1:4 - but 1:2 for potatoes and rice  - target: 150  - ISF: 150-200: 4 units, 200-230: 8 units, 230-and higher: 10 units  Please return in 1.5 months with your sugar log.

## 2015-09-30 DIAGNOSIS — I10 Essential (primary) hypertension: Secondary | ICD-10-CM | POA: Diagnosis not present

## 2015-09-30 DIAGNOSIS — E7800 Pure hypercholesterolemia, unspecified: Secondary | ICD-10-CM | POA: Diagnosis not present

## 2015-09-30 DIAGNOSIS — Z79899 Other long term (current) drug therapy: Secondary | ICD-10-CM | POA: Diagnosis not present

## 2015-09-30 DIAGNOSIS — Z1389 Encounter for screening for other disorder: Secondary | ICD-10-CM | POA: Diagnosis not present

## 2015-09-30 DIAGNOSIS — E538 Deficiency of other specified B group vitamins: Secondary | ICD-10-CM | POA: Diagnosis not present

## 2015-09-30 DIAGNOSIS — E1140 Type 2 diabetes mellitus with diabetic neuropathy, unspecified: Secondary | ICD-10-CM | POA: Diagnosis not present

## 2015-10-15 DIAGNOSIS — E1142 Type 2 diabetes mellitus with diabetic polyneuropathy: Secondary | ICD-10-CM | POA: Diagnosis not present

## 2015-10-15 DIAGNOSIS — M7660 Achilles tendinitis, unspecified leg: Secondary | ICD-10-CM | POA: Diagnosis not present

## 2015-10-17 ENCOUNTER — Ambulatory Visit: Payer: Medicare Other | Admitting: Internal Medicine

## 2015-10-17 DIAGNOSIS — M6701 Short Achilles tendon (acquired), right ankle: Secondary | ICD-10-CM | POA: Diagnosis not present

## 2015-10-21 ENCOUNTER — Telehealth: Payer: Self-pay | Admitting: Internal Medicine

## 2015-10-21 ENCOUNTER — Encounter: Payer: Self-pay | Admitting: Internal Medicine

## 2015-10-21 MED ORDER — INSULIN DETEMIR 100 UNIT/ML FLEXPEN: 42.0000 [IU] | PEN_INJECTOR | SUBCUTANEOUS | Status: DC

## 2015-10-21 NOTE — Telephone Encounter (Signed)
Pt needs us to change the levemir script to the 42 u she is now [facility] [place]

## 2015-10-21 NOTE — Telephone Encounter (Signed)
Refill with new dosage sent to pt's pharmacy.

## 2015-10-27 DIAGNOSIS — L918 Other hypertrophic disorders of the skin: Secondary | ICD-10-CM | POA: Diagnosis not present

## 2015-11-10 ENCOUNTER — Encounter: Payer: Self-pay | Admitting: Internal Medicine

## 2015-11-12 ENCOUNTER — Ambulatory Visit: Payer: Medicare Other | Admitting: Internal Medicine

## 2015-12-17 DIAGNOSIS — M6701 Short Achilles tendon (acquired), right ankle: Secondary | ICD-10-CM | POA: Diagnosis not present

## 2015-12-24 DIAGNOSIS — Z933 Colostomy status: Secondary | ICD-10-CM | POA: Diagnosis not present

## 2015-12-25 DIAGNOSIS — R2689 Other abnormalities of gait and mobility: Secondary | ICD-10-CM | POA: Diagnosis not present

## 2015-12-25 DIAGNOSIS — M25572 Pain in left ankle and joints of left foot: Secondary | ICD-10-CM | POA: Diagnosis not present

## 2015-12-25 DIAGNOSIS — M6281 Muscle weakness (generalized): Secondary | ICD-10-CM | POA: Diagnosis not present

## 2015-12-25 DIAGNOSIS — M25571 Pain in right ankle and joints of right foot: Secondary | ICD-10-CM | POA: Diagnosis not present

## 2015-12-29 DIAGNOSIS — R2689 Other abnormalities of gait and mobility: Secondary | ICD-10-CM | POA: Diagnosis not present

## 2015-12-29 DIAGNOSIS — M25572 Pain in left ankle and joints of left foot: Secondary | ICD-10-CM | POA: Diagnosis not present

## 2015-12-29 DIAGNOSIS — M6281 Muscle weakness (generalized): Secondary | ICD-10-CM | POA: Diagnosis not present

## 2015-12-29 DIAGNOSIS — M25571 Pain in right ankle and joints of right foot: Secondary | ICD-10-CM | POA: Diagnosis not present

## 2016-01-02 DIAGNOSIS — M25571 Pain in right ankle and joints of right foot: Secondary | ICD-10-CM | POA: Diagnosis not present

## 2016-01-02 DIAGNOSIS — R2689 Other abnormalities of gait and mobility: Secondary | ICD-10-CM | POA: Diagnosis not present

## 2016-01-02 DIAGNOSIS — M25572 Pain in left ankle and joints of left foot: Secondary | ICD-10-CM | POA: Diagnosis not present

## 2016-01-02 DIAGNOSIS — M6281 Muscle weakness (generalized): Secondary | ICD-10-CM | POA: Diagnosis not present

## 2016-01-09 DIAGNOSIS — M6281 Muscle weakness (generalized): Secondary | ICD-10-CM | POA: Diagnosis not present

## 2016-01-09 DIAGNOSIS — M25571 Pain in right ankle and joints of right foot: Secondary | ICD-10-CM | POA: Diagnosis not present

## 2016-01-09 DIAGNOSIS — R2689 Other abnormalities of gait and mobility: Secondary | ICD-10-CM | POA: Diagnosis not present

## 2016-01-09 DIAGNOSIS — M25572 Pain in left ankle and joints of left foot: Secondary | ICD-10-CM | POA: Diagnosis not present

## 2016-01-16 DIAGNOSIS — M25571 Pain in right ankle and joints of right foot: Secondary | ICD-10-CM | POA: Diagnosis not present

## 2016-01-16 DIAGNOSIS — R2689 Other abnormalities of gait and mobility: Secondary | ICD-10-CM | POA: Diagnosis not present

## 2016-01-16 DIAGNOSIS — M6281 Muscle weakness (generalized): Secondary | ICD-10-CM | POA: Diagnosis not present

## 2016-01-16 DIAGNOSIS — M25572 Pain in left ankle and joints of left foot: Secondary | ICD-10-CM | POA: Diagnosis not present

## 2016-01-23 DIAGNOSIS — R2689 Other abnormalities of gait and mobility: Secondary | ICD-10-CM | POA: Diagnosis not present

## 2016-01-23 DIAGNOSIS — Z933 Colostomy status: Secondary | ICD-10-CM | POA: Diagnosis not present

## 2016-01-23 DIAGNOSIS — M25571 Pain in right ankle and joints of right foot: Secondary | ICD-10-CM | POA: Diagnosis not present

## 2016-01-23 DIAGNOSIS — M6281 Muscle weakness (generalized): Secondary | ICD-10-CM | POA: Diagnosis not present

## 2016-01-23 DIAGNOSIS — M25572 Pain in left ankle and joints of left foot: Secondary | ICD-10-CM | POA: Diagnosis not present

## 2016-02-02 DIAGNOSIS — E1140 Type 2 diabetes mellitus with diabetic neuropathy, unspecified: Secondary | ICD-10-CM | POA: Diagnosis not present

## 2016-02-02 DIAGNOSIS — E559 Vitamin D deficiency, unspecified: Secondary | ICD-10-CM | POA: Diagnosis not present

## 2016-02-02 DIAGNOSIS — I10 Essential (primary) hypertension: Secondary | ICD-10-CM | POA: Diagnosis not present

## 2016-02-02 DIAGNOSIS — E7800 Pure hypercholesterolemia, unspecified: Secondary | ICD-10-CM | POA: Diagnosis not present

## 2016-02-02 DIAGNOSIS — Z79899 Other long term (current) drug therapy: Secondary | ICD-10-CM | POA: Diagnosis not present

## 2016-02-02 DIAGNOSIS — J45909 Unspecified asthma, uncomplicated: Secondary | ICD-10-CM | POA: Diagnosis not present

## 2016-02-05 ENCOUNTER — Other Ambulatory Visit: Payer: Self-pay | Admitting: Internal Medicine

## 2016-02-11 DIAGNOSIS — M654 Radial styloid tenosynovitis [de Quervain]: Secondary | ICD-10-CM | POA: Diagnosis not present

## 2016-02-18 DIAGNOSIS — E1142 Type 2 diabetes mellitus with diabetic polyneuropathy: Secondary | ICD-10-CM | POA: Diagnosis not present

## 2016-02-18 DIAGNOSIS — M7660 Achilles tendinitis, unspecified leg: Secondary | ICD-10-CM | POA: Diagnosis not present

## 2016-03-08 DIAGNOSIS — E109 Type 1 diabetes mellitus without complications: Secondary | ICD-10-CM | POA: Diagnosis not present

## 2016-03-08 DIAGNOSIS — R748 Abnormal levels of other serum enzymes: Secondary | ICD-10-CM | POA: Diagnosis not present

## 2016-03-09 ENCOUNTER — Other Ambulatory Visit: Payer: Self-pay | Admitting: Internal Medicine

## 2016-03-09 DIAGNOSIS — M7662 Achilles tendinitis, left leg: Secondary | ICD-10-CM | POA: Diagnosis not present

## 2016-03-09 DIAGNOSIS — M79672 Pain in left foot: Secondary | ICD-10-CM | POA: Diagnosis not present

## 2016-03-09 DIAGNOSIS — M25571 Pain in right ankle and joints of right foot: Secondary | ICD-10-CM | POA: Diagnosis not present

## 2016-03-10 DIAGNOSIS — M6701 Short Achilles tendon (acquired), right ankle: Secondary | ICD-10-CM | POA: Diagnosis not present

## 2016-03-24 ENCOUNTER — Other Ambulatory Visit: Payer: Self-pay | Admitting: Internal Medicine

## 2016-03-26 DIAGNOSIS — Z933 Colostomy status: Secondary | ICD-10-CM | POA: Diagnosis not present

## 2016-03-26 MED ORDER — VITAMIN D (ERGOCALCIFEROL) 1.25 MG (50000 UNIT) PO CAPS
50000.0000 [IU] | ORAL_CAPSULE | Freq: Every day | ORAL | 1 refills | Status: DC
Start: 2016-03-26 — End: 2016-09-15

## 2016-04-16 DIAGNOSIS — J0100 Acute maxillary sinusitis, unspecified: Secondary | ICD-10-CM | POA: Diagnosis not present

## 2016-04-23 DIAGNOSIS — J45909 Unspecified asthma, uncomplicated: Secondary | ICD-10-CM | POA: Diagnosis not present

## 2016-04-23 DIAGNOSIS — R0602 Shortness of breath: Secondary | ICD-10-CM | POA: Diagnosis not present

## 2016-04-27 DIAGNOSIS — E109 Type 1 diabetes mellitus without complications: Secondary | ICD-10-CM | POA: Diagnosis not present

## 2016-05-21 ENCOUNTER — Other Ambulatory Visit: Payer: Self-pay | Admitting: Internal Medicine

## 2016-05-21 NOTE — Telephone Encounter (Signed)
Okay to refill? I do not see on medication list. Please advise. Thank you!

## 2016-05-27 ENCOUNTER — Other Ambulatory Visit: Payer: Self-pay

## 2016-05-27 ENCOUNTER — Encounter: Payer: Self-pay | Admitting: Internal Medicine

## 2016-05-27 MED ORDER — INSULIN ASPART 100 UNIT/ML FLEXPEN: PEN_INJECTOR | SUBCUTANEOUS | 2 refills | Status: DC

## 2016-05-27 NOTE — Telephone Encounter (Signed)
Scheduled patient for Dr.Gherghe schedule on Monday. Patient understood and will be here for that appointment.

## 2016-05-31 ENCOUNTER — Encounter: Payer: Self-pay | Admitting: Internal Medicine

## 2016-05-31 ENCOUNTER — Ambulatory Visit (INDEPENDENT_AMBULATORY_CARE_PROVIDER_SITE_OTHER): Payer: Medicare Other | Admitting: Internal Medicine

## 2016-05-31 VITALS — BP 108/82 | HR 86 | Ht 68.0 in | Wt 321.0 lb

## 2016-05-31 DIAGNOSIS — E559 Vitamin D deficiency, unspecified: Secondary | ICD-10-CM

## 2016-05-31 DIAGNOSIS — E1142 Type 2 diabetes mellitus with diabetic polyneuropathy: Secondary | ICD-10-CM

## 2016-05-31 DIAGNOSIS — IMO0002 Reserved for concepts with insufficient information to code with codable children: Secondary | ICD-10-CM

## 2016-05-31 DIAGNOSIS — E1165 Type 2 diabetes mellitus with hyperglycemia: Secondary | ICD-10-CM

## 2016-05-31 LAB — VITAMIN D 25 HYDROXY (VIT D DEFICIENCY, FRACTURES): VITD: 35.3 ng/mL (ref 30.00–100.00)

## 2016-05-31 LAB — POCT GLYCOSYLATED HEMOGLOBIN (HGB A1C): Hemoglobin A1C: 7.4

## 2016-05-31 NOTE — Progress Notes (Signed)
Patient ID: Gavin Smith, male   DOB: [birth year], 40 y.o.   MRN: [id number]  HPI: Gavin Smith is a 40 y.o.-year-old male, initially referred by her PCP, Dr. Shirleen Schirmer, for management of DM2, dx in 11 (40 y/o), insulin-dependent, uncontrolled, wit complications (PN). Last visit 8 mo ago. Now seeing Dr. Jenean Lindau. She is here with her service dog, Merrell.  DM2: Reviewed hx: She has a h/o autonomic neuropathy of the GI tract (dx in 1986, when she was 40 y/o - not related to DM - and has severe diarrhea: "food goes through me"), seeing Dr. [place]. She has a permanent colostomy - sx in 09/2014.   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 However, her GI status improved After her colostomy. She had a high A1c at last visit as her insurance did not approve NovoLog and she was forced to change to Humalog/Humulin. Since then, I sent the PA to her insurance and NovoLog was approved.  She usually uses: - Levemir 42 units at bedtime - NovoLog:  - ICR 1:4 - but 1:2 for potatoes and rice  - target: 150  - ISF: 150-200: 4 units, 200-230: 8 units, 230-and higher: 10 units She cannot digest pills. She tried oral meds in the past >> "they went right through me". She also tried Lantus >> this did not work for her >> sugars unchanged despite increasing the dose.  She uses an Financial controller.  Pt checks her sugars 1-3x a day: - Fasting: 137-162, 201 >> 162-224 >> 67 (overcorrected a high), 173-228  >> 317, 401 >> 161 - 2h after  b'fast: 160-262, 290 >> 183-263 >> n/c  - before lunch:  N/c >> same >> 127-175 >> 383 >> 132 - 2h after lunch: n/c >> 224-287 >> 173-272 >> 204-405 >> 76 - before dinner: 103-224 >> 104, 152-226 >> 311-493 >> 210, 319 - 2h after dinner: 250-281 >> 222-384 >> 65, 72, 117-300 >> 292-391 >> 130, 174-310 - bedtime: 257 (sick) >> see above >> 86, 91-200, 332 >> see above >> 66 x1, 106-196 - nighttime: 154, 193, 228 >> 73-110 (takes insulin if high after dinner) >> 69-217, 285 >> n/c + recent lows - 43 x1 - dog woke her up >> recently: 66. No lows since last visit; she has hypoglycemia awareness at 62. Highest sugars recently 390s >> 386.  - no CKD: 02/03/2016: 13/0.93, Glu 183 07/02/2015: CMP: Normal, except glucose 216, BUN/creatinine 10/0.81, GFR 93, ALT 39 (0-32), bilirubin 1.3 (0-1.2) 07/23/2014: BUN/creatinine 12/0.9 Component     Latest Ref Rng 08/12/2015  Microalb, Ur     0.0 - 1.9 mg/dL 1.5  Creatinine,U      305.4  MICROALB/CREAT RATIO     0.0 - 30.0 mg/g 0.5   On Lisinopril. - last set of lipids: 02/03/2016: 205/284/55/93 07/02/2015: 206/229/63/97  07/23/2014: 324/868/42/171 AST/ALT: 121/76 Taken off statin b/c hemochromatosis. - last eye exam was in 04/2016 [facility]). No DR.  - + numbness and tingling in  her feet. On Cymbalta for neuropathy.   Vitamin D def.: Vit D 23.5 in 02/03/2016 - on Ergocalciferol 50,000 IU daily. Vit D 13.9 in 06/2014 - despite being on Ergocalciferol 50,000 IU 2x a week. Vit D 19.3 in 11/2014 - repeat, on Ergocalciferol 50,000 IU 2x a week >> dose increased to 4 times a week Vit D 23.86 in 07/2015 - on ergocalciferol 50,000 IU 4x a week >> dose increased to daily   She is now on high-dose vitamin D daily.   Other labs received from PCP, drawn on 07/02/2015: - TSH 1.49  She was also dx with hemochromatosis, allergy-triggered asthma. Also, HTN, HL, OSA -on CPAP, transaminitis, GERD, depression. She is on  Depo-Provera.  ROS: Constitutional: + weight gain, no fatigue, no hot flushes, + nocturia Eyes: + blurry vision, no xerophthalmia ENT: no sore throat, no nodules palpated in throat, + dysphagia/no odynophagia, no hoarseness Cardiovascular: no CP/ +SOB/no palpitations/leg swelling Respiratory: no cough/ +SOB Gastrointestinal: no heartburn/N/+ V/no D/C Musculoskeletal: + muscle/no joint aches Skin: no rashes Neurological: no tremors/numbness/tingling/dizziness, + HA  I reviewed pt's medications, allergies, PMH, social hx, family hx, and changes were documented in the history of present illness. Otherwise, unchanged from my initial visit note. She started Tizanidine.  Past Medical History:  Diagnosis Date  . Allergic rhinitis, cause unspecified   . Essential hypertension, benign   . Extrinsic asthma, unspecified   . Generalized osteoarthrosis, involving multiple sites   . Insomnia, unspecified   . Migraine, unspecified, without mention of intractable migraine without mention of status migrainosus   . Pityriasis rosea   . Pure hypercholesterolemia   . Type I (juvenile type) diabetes mellitus without mention of complication, not stated as uncontrolled   . Unspecified sleep apnea    Past Surgical History:  Procedure Laterality Date  . BREAST REDUCTION SURGERY  2005  . CHOLECYSTECTOMY  2000  . RHINOPLASTY  2005  . SPINAL FUSION  2006   L5-S1 spinal fusion   History   Social History  . Marital Status: Single    Spouse Name: N/A  . Number of Children: 0  . Years of Education: 14   Occupational History  . Prev. EMS, now disabled   Social History Main Topics  . Smoking status: Never Smoker   . Smokeless tobacco: Not on file  . Alcohol Use: No  . Drug Use: No   Current Outpatient Prescriptions on File Prior to Visit  Medication Sig Dispense Refill  . ACCU-CHEK AVIVA PLUS test strip USE TO CHECK BLOOD SUGAR FIVE TIMES A DAY AS DIRECTED 150 each 11  . ACCU-CHEK FASTCLIX  LANCETS MISC USE TO CHECK BLOOD SUGAR THREE TIMES A DAY 102 each 4  . albuterol (PROAIR HFA) 108 (90 BASE) MCG/ACT inhaler Inhale 1 puff into the lungs as needed.      Marland Kitchen albuterol (PROVENTIL) (2.5 MG/3ML) 0.083% nebulizer solution Take 2.5 mg by nebulization as needed.      Marland Kitchen atorvastatin (LIPITOR) 10 MG tablet Take 10 mg by mouth every other day.  0  . B-D ULTRAFINE III SHORT PEN 31G X 8 MM MISC   1  . B-D ULTRAFINE III SHORT PEN 31G X 8 MM MISC use for 5 INJECTIONS daily 100 each 3  . Blood Glucose Monitoring Suppl (ACCU-CHEK AVIVA CONNECT) W/DEVICE KIT 1 each by Does not apply route daily. Dx: E11.42 1 kit 0  . Cholecalciferol (VITAMIN D) 2000 UNITS CAPS Take 1 capsule by mouth daily.    Marland Kitchen  insulin aspart (NOVOLOG FLEXPEN) 100 UNIT/ML FlexPen Inject 50 units into the skin QID 60 pen 2  . Insulin Syringe-Needle U-100 31G X 5/16" 0.3 ML MISC Use to inject insulin 3 times daily as directed. 100 each 5  . LEVEMIR FLEXTOUCH 100 UNIT/ML Pen inject 42 units subcutaneously every morning 15 pen 2  . lisinopril (PRINIVIL,ZESTRIL) 10 MG tablet Take 10 mg by mouth daily.      Marland Kitchen loratadine (CLARITIN) 10 MG tablet Take 10 mg by mouth daily.      . Multiple Vitamin (MULTI-DAY PO) Take 1 tablet by mouth daily.      Marland Kitchen sulfamethoxazole-trimethoprim (BACTRIM DS) 800-160 MG per tablet Take 1 tablet by mouth daily.     . traMADol (ULTRAM) 50 MG tablet Take 50 mg by mouth as needed.      . vitamin C (ASCORBIC ACID) 500 MG tablet Take 500 mg by mouth daily.      . Vitamin D, Ergocalciferol, (DRISDOL) 50000 units CAPS capsule Take 1 capsule (50,000 Units total) by mouth daily. 30 capsule 1  . amitriptyline (ELAVIL) 25 MG tablet Take 25 mg by mouth.    . DULoxetine (CYMBALTA) 30 MG capsule Take 30 mg by mouth.    . Melatonin 5 MG TABS Take 1 tablet by mouth at bedtime as needed.     . zolpidem (AMBIEN) 10 MG tablet Take 10 mg by mouth at bedtime as needed.       No current facility-administered medications on file  prior to visit.    Allergies  Allergen Reactions  . Fenofibrate Other (See Comments)    Severe leg pain  . Humalog [Insulin Lispro] Other (See Comments)    Drug tolerance >> ineffectiveness! Pt needs NovoLog instead!  Marland Kitchen Morphine And Related   . Penicillins    Family History  Problem Relation Age of Onset  . Diabetes Mother   . Hypertension Mother   . Hyperlipidemia Mother   . Hypertension Father   . Hyperlipidemia Father   . Diabetes Maternal Grandmother   . Cancer Maternal Grandmother     Breast Cancer  . Hypertension Maternal Grandfather   . Heart disease Maternal Grandfather   . Heart disease Paternal Grandfather   . Cancer Other     Ovarian Cancer-Grandparent   PE: BP 108/82 (BP Location: Left Arm, Patient Position: Sitting)   Pulse 86   Ht '5\' 8"'  (1.727 m)   Wt (!) 321 lb (145.6 kg)   BMI 48.81 kg/m  Body mass index is 48.81 kg/m. Wt Readings from Last 3 Encounters:  05/31/16 (!) 321 lb (145.6 kg)  09/25/15 (!) 313 lb (142 kg)  08/12/15 (!) 318 lb (144.2 kg)   Constitutional: obese, in NAD; she is here with her dog Geneticist, molecular) Eyes: PERRLA, EOMI, no exophthalmos ENT: moist mucous membranes, no thyromegaly, no cervical lymphadenopathy Cardiovascular: tachycardia, RR, No MRG Respiratory: CTA B Gastrointestinal: abdomen soft, NT, ND, BS+ Musculoskeletal: no deformities, strength intact in all 4 Skin: moist, warm, no rashes Neurological: no tremor with outstretched hands, DTR normal in all 4  ASSESSMENT: 1. DM2, insulin-dependent, uncontrolled, with complications - PN  Humalog did not work for her >> sugars >200 constantly. Since she cannot use Humalog, we sent and then re-sent a PA for NovoLog (last form sent in 07/2015). NovoLog was finally approved for her.  2. Vit D def  PLAN:  1. Patient with long-standing, uncontrolled diabetes, on Basal-bolus insulin regimen, with improved control compared to last visit, especially after she  switched from Humalog to  NovoLog. Pt has had a lot of problems controlling her DM over the years 2/2 impredictability of retaining the food she eats due to her autonomic GI neuropathy. She now has a permanent colostomy >> smaller meals but more frequently >> now sugars increase as the day goes by and she believes that this is due to Levemir not lasting 24 hours for her. We will split Levemir in 2 doses. However, I also advised her that she may need more insulin with dinner. - I suggested to:  Patient Instructions  Please split Levemir to 22 units 2x a day.  Continue NovoLog:  - ICR 1:4 - but 1:2 for potatoes and rice  - target: 150  - ISF: 150-200: 4 units, 200-230: 8 units, 230-and higher: 10 units  Please stop at the lab.  Please return in 3 months with your sugar log.    - continue checking sugars at different times of the day - check 2-3 times a day, rotating checks - HbA1c today >> 7.4% (better) - she is up-to-date with yearly eye exams - Return to clinic in 3 mo with sugar log   2. Vit D def - reviewed previous level >> still low on Ergocalciferol to 50,000 IU daily. We may need to increase her ergocalciferol dose further. - Will recheck her level today and will also add a calcitriol level to see if she needs supplementation with this  Office Visit on 05/31/2016  Component Date Value Ref Range Status  . VITD 05/31/2016 35.30  30.00 - 100.00 ng/mL Final  . Hemoglobin A1C 05/31/2016 7.4   Final  Vitamin D level is finally normal. Calcitriol level is pending. For now, will continue the current vitamin D dose.  Philemon Kingdom, MD PhD [facility] Endocrinology

## 2016-05-31 NOTE — Patient Instructions (Addendum)
Please split Levemir to 22 units 2x a day.  Continue NovoLog:  - ICR 1:4 - but 1:2 for potatoes and rice  - target: 150  - ISF: 150-200: 4 units, 200-230: 8 units, 230-and higher: 10 units  Please stop at the lab.  Please return in 3 months with your sugar log.

## 2016-06-02 LAB — VITAMIN D 1,25 DIHYDROXY
VITAMIN D2 1, 25 (OH): 48 pg/mL
Vitamin D 1, 25 (OH)2 Total: 57 pg/mL (ref 18–72)
Vitamin D3 1, 25 (OH)2: 9 pg/mL

## 2016-06-02 NOTE — Telephone Encounter (Signed)
See message.

## 2016-06-03 DIAGNOSIS — E1140 Type 2 diabetes mellitus with diabetic neuropathy, unspecified: Secondary | ICD-10-CM | POA: Diagnosis not present

## 2016-06-03 DIAGNOSIS — J45909 Unspecified asthma, uncomplicated: Secondary | ICD-10-CM | POA: Diagnosis not present

## 2016-06-03 DIAGNOSIS — E7800 Pure hypercholesterolemia, unspecified: Secondary | ICD-10-CM | POA: Diagnosis not present

## 2016-06-10 DIAGNOSIS — J45909 Unspecified asthma, uncomplicated: Secondary | ICD-10-CM | POA: Diagnosis not present

## 2016-06-22 ENCOUNTER — Other Ambulatory Visit: Payer: Self-pay | Admitting: Internal Medicine

## 2016-06-22 DIAGNOSIS — R300 Dysuria: Secondary | ICD-10-CM | POA: Diagnosis not present

## 2016-06-22 DIAGNOSIS — N3090 Cystitis, unspecified without hematuria: Secondary | ICD-10-CM | POA: Diagnosis not present

## 2016-07-01 DIAGNOSIS — Z933 Colostomy status: Secondary | ICD-10-CM | POA: Diagnosis not present

## 2016-07-10 DIAGNOSIS — J45909 Unspecified asthma, uncomplicated: Secondary | ICD-10-CM | POA: Diagnosis not present

## 2016-07-22 ENCOUNTER — Emergency Department (HOSPITAL_COMMUNITY): Payer: Medicare Other

## 2016-07-22 ENCOUNTER — Encounter (HOSPITAL_COMMUNITY): Payer: Self-pay | Admitting: *Deleted

## 2016-07-22 ENCOUNTER — Inpatient Hospital Stay (HOSPITAL_COMMUNITY)
Admission: EM | Admit: 2016-07-22 | Discharge: 2016-07-25 | DRG: 202 | Disposition: A | Discharge status: Home Health | Payer: Medicare Other | Attending: Oncology | Admitting: Oncology

## 2016-07-22 DIAGNOSIS — IMO0002 Reserved for concepts with insufficient information to code with codable children: Secondary | ICD-10-CM

## 2016-07-22 DIAGNOSIS — Z794 Long term (current) use of insulin: Secondary | ICD-10-CM

## 2016-07-22 DIAGNOSIS — G43909 Migraine, unspecified, not intractable, without status migrainosus: Secondary | ICD-10-CM | POA: Diagnosis not present

## 2016-07-22 DIAGNOSIS — G4730 Sleep apnea, unspecified: Secondary | ICD-10-CM | POA: Diagnosis not present

## 2016-07-22 DIAGNOSIS — J45909 Unspecified asthma, uncomplicated: Secondary | ICD-10-CM | POA: Diagnosis not present

## 2016-07-22 DIAGNOSIS — Z888 Allergy status to other drugs, medicaments and biological substances status: Secondary | ICD-10-CM

## 2016-07-22 DIAGNOSIS — T486X5A Adverse effect of antiasthmatics, initial encounter: Secondary | ICD-10-CM | POA: Diagnosis present

## 2016-07-22 DIAGNOSIS — Z981 Arthrodesis status: Secondary | ICD-10-CM | POA: Diagnosis not present

## 2016-07-22 DIAGNOSIS — E860 Dehydration: Secondary | ICD-10-CM | POA: Diagnosis present

## 2016-07-22 DIAGNOSIS — R1012 Left upper quadrant pain: Secondary | ICD-10-CM | POA: Diagnosis not present

## 2016-07-22 DIAGNOSIS — Z939 Artificial opening status, unspecified: Secondary | ICD-10-CM

## 2016-07-22 DIAGNOSIS — Z880 Allergy status to penicillin: Secondary | ICD-10-CM | POA: Diagnosis not present

## 2016-07-22 DIAGNOSIS — E7800 Pure hypercholesterolemia, unspecified: Secondary | ICD-10-CM | POA: Diagnosis not present

## 2016-07-22 DIAGNOSIS — I4711 Inappropriate sinus tachycardia, so stated: Secondary | ICD-10-CM

## 2016-07-22 DIAGNOSIS — R1032 Left lower quadrant pain: Secondary | ICD-10-CM | POA: Diagnosis not present

## 2016-07-22 DIAGNOSIS — K3189 Other diseases of stomach and duodenum: Secondary | ICD-10-CM

## 2016-07-22 DIAGNOSIS — M159 Polyosteoarthritis, unspecified: Secondary | ICD-10-CM | POA: Diagnosis not present

## 2016-07-22 DIAGNOSIS — K219 Gastro-esophageal reflux disease without esophagitis: Secondary | ICD-10-CM | POA: Diagnosis present

## 2016-07-22 DIAGNOSIS — J45901 Unspecified asthma with (acute) exacerbation: Principal | ICD-10-CM | POA: Diagnosis present

## 2016-07-22 DIAGNOSIS — I10 Essential (primary) hypertension: Secondary | ICD-10-CM | POA: Diagnosis not present

## 2016-07-22 DIAGNOSIS — Z933 Colostomy status: Secondary | ICD-10-CM

## 2016-07-22 DIAGNOSIS — E7849 Other hyperlipidemia: Secondary | ICD-10-CM

## 2016-07-22 DIAGNOSIS — T380X5A Adverse effect of glucocorticoids and synthetic analogues, initial encounter: Secondary | ICD-10-CM | POA: Diagnosis present

## 2016-07-22 DIAGNOSIS — E1043 Type 1 diabetes mellitus with diabetic autonomic (poly)neuropathy: Secondary | ICD-10-CM | POA: Diagnosis not present

## 2016-07-22 DIAGNOSIS — Z9049 Acquired absence of other specified parts of digestive tract: Secondary | ICD-10-CM

## 2016-07-22 DIAGNOSIS — Z8249 Family history of ischemic heart disease and other diseases of the circulatory system: Secondary | ICD-10-CM

## 2016-07-22 DIAGNOSIS — R112 Nausea with vomiting, unspecified: Secondary | ICD-10-CM | POA: Diagnosis present

## 2016-07-22 DIAGNOSIS — Z6841 Body Mass Index (BMI) 40.0 and over, adult: Secondary | ICD-10-CM

## 2016-07-22 DIAGNOSIS — Z885 Allergy status to narcotic agent status: Secondary | ICD-10-CM | POA: Diagnosis not present

## 2016-07-22 DIAGNOSIS — K319 Disease of stomach and duodenum, unspecified: Secondary | ICD-10-CM

## 2016-07-22 DIAGNOSIS — E1169 Type 2 diabetes mellitus with other specified complication: Secondary | ICD-10-CM

## 2016-07-22 DIAGNOSIS — R000 Tachycardia, unspecified: Secondary | ICD-10-CM | POA: Diagnosis present

## 2016-07-22 DIAGNOSIS — E669 Obesity, unspecified: Secondary | ICD-10-CM | POA: Diagnosis present

## 2016-07-22 DIAGNOSIS — D72829 Elevated white blood cell count, unspecified: Secondary | ICD-10-CM | POA: Diagnosis not present

## 2016-07-22 DIAGNOSIS — R109 Unspecified abdominal pain: Secondary | ICD-10-CM | POA: Diagnosis not present

## 2016-07-22 DIAGNOSIS — R0602 Shortness of breath: Secondary | ICD-10-CM | POA: Diagnosis not present

## 2016-07-22 HISTORY — DX: Unspecified asthma with (acute) exacerbation: J45.901

## 2016-07-22 LAB — BASIC METABOLIC PANEL
ANION GAP: 12 (ref 5–15)
BUN: 14 mg/dL (ref 6–20)
CALCIUM: 10.1 mg/dL (ref 8.9–10.3)
CO2: 22 mmol/L (ref 22–32)
Chloride: 101 mmol/L (ref 101–111)
Creatinine, Ser: 0.94 mg/dL (ref 0.44–1.00)
GLUCOSE: 355 mg/dL — AB (ref 65–99)
Potassium: 3.9 mmol/L (ref 3.5–5.1)
Sodium: 135 mmol/L (ref 135–145)

## 2016-07-22 LAB — CBC WITH DIFFERENTIAL/PLATELET
BASOS ABS: 0 10*3/uL (ref 0.0–0.1)
BASOS PCT: 0 %
EOS PCT: 2 %
Eosinophils Absolute: 0.2 10*3/uL (ref 0.0–0.7)
HCT: 43.4 % (ref 36.0–46.0)
Hemoglobin: 14.9 g/dL (ref 12.0–15.0)
Lymphocytes Relative: 22 %
Lymphs Abs: 2.9 10*3/uL (ref 0.7–4.0)
MCH: 30.5 pg (ref 26.0–34.0)
MCHC: 34.3 g/dL (ref 30.0–36.0)
MCV: 88.9 fL (ref 78.0–100.0)
MONO ABS: 0.5 10*3/uL (ref 0.1–1.0)
Monocytes Relative: 4 %
Neutro Abs: 9.7 10*3/uL — ABNORMAL HIGH (ref 1.7–7.7)
Neutrophils Relative %: 72 %
PLATELETS: 291 10*3/uL (ref 150–400)
RBC: 4.88 MIL/uL (ref 3.87–5.11)
RDW: 12.7 % (ref 11.5–15.5)
WBC: 13.4 10*3/uL — ABNORMAL HIGH (ref 4.0–10.5)

## 2016-07-22 LAB — GLUCOSE, CAPILLARY
GLUCOSE-CAPILLARY: 517 mg/dL — AB (ref 65–99)
GLUCOSE-CAPILLARY: 499 mg/dL — AB (ref 65–99)

## 2016-07-22 LAB — I-STAT TROPONIN, ED: Troponin i, poc: 0 ng/mL (ref 0.00–0.08)

## 2016-07-22 LAB — POC URINE PREG, ED: PREG TEST UR: NEGATIVE

## 2016-07-22 LAB — CBG MONITORING, ED
GLUCOSE-CAPILLARY: 455 mg/dL — AB (ref 65–99)
Glucose-Capillary: 314 mg/dL — ABNORMAL HIGH (ref 65–99)
Glucose-Capillary: 473 mg/dL — ABNORMAL HIGH (ref 65–99)

## 2016-07-22 MED ORDER — BUDESONIDE 0.5 MG/2ML IN SUSP
0.5000 mg | Freq: Two times a day (BID) | RESPIRATORY_TRACT | Status: DC
  Administered 2016-07-22 – 2016-07-25 (×6): 0.5 mg via RESPIRATORY_TRACT
  Filled 2016-07-22 (×6): qty 2

## 2016-07-22 MED ORDER — HEPARIN SODIUM (PORCINE) 5000 UNIT/ML IJ SOLN
5000.0000 [IU] | Freq: Three times a day (TID) | INTRAMUSCULAR | Status: DC
  Administered 2016-07-23 – 2016-07-25 (×6): 5000 [IU] via SUBCUTANEOUS
  Filled 2016-07-22 (×7): qty 1

## 2016-07-22 MED ORDER — TIZANIDINE HCL 2 MG PO TABS
2.0000 mg | ORAL_TABLET | Freq: Every day | ORAL | Status: DC
  Administered 2016-07-22 – 2016-07-24 (×3): 2 mg via ORAL
  Filled 2016-07-22 (×3): qty 1

## 2016-07-22 MED ORDER — IPRATROPIUM BROMIDE 0.02 % IN SOLN
0.5000 mg | Freq: Once | RESPIRATORY_TRACT | Status: AC
  Administered 2016-07-22: 0.5 mg via RESPIRATORY_TRACT
  Filled 2016-07-22: qty 2.5

## 2016-07-22 MED ORDER — LISINOPRIL 10 MG PO TABS
10.0000 mg | ORAL_TABLET | Freq: Every day | ORAL | Status: DC
  Administered 2016-07-22 – 2016-07-25 (×4): 10 mg via ORAL
  Filled 2016-07-22 (×4): qty 1

## 2016-07-22 MED ORDER — ALBUTEROL (5 MG/ML) CONTINUOUS INHALATION SOLN
10.0000 mg/h | INHALATION_SOLUTION | RESPIRATORY_TRACT | Status: DC
  Administered 2016-07-22: 10 mg/h via RESPIRATORY_TRACT
  Filled 2016-07-22: qty 20

## 2016-07-22 MED ORDER — INSULIN GLARGINE 100 UNIT/ML ~~LOC~~ SOLN
30.0000 [IU] | Freq: Every day | SUBCUTANEOUS | Status: DC
  Administered 2016-07-22: 30 [IU] via SUBCUTANEOUS
  Filled 2016-07-22: qty 0.3

## 2016-07-22 MED ORDER — TRAMADOL HCL 50 MG PO TABS
100.0000 mg | ORAL_TABLET | Freq: Every evening | ORAL | Status: DC | PRN
  Administered 2016-07-22 – 2016-07-25 (×3): 100 mg via ORAL
  Filled 2016-07-22 (×3): qty 2

## 2016-07-22 MED ORDER — BUDESONIDE 180 MCG/ACT IN AEPB: 2.0000 | INHALATION_SPRAY | Freq: Two times a day (BID) | RESPIRATORY_TRACT | Status: DC

## 2016-07-22 MED ORDER — DOCUSATE SODIUM 100 MG PO CAPS
100.0000 mg | ORAL_CAPSULE | Freq: Two times a day (BID) | ORAL | Status: DC
  Administered 2016-07-22 – 2016-07-24 (×4): 100 mg via ORAL
  Filled 2016-07-22 (×4): qty 1

## 2016-07-22 MED ORDER — INSULIN ASPART 100 UNIT/ML ~~LOC~~ SOLN
4.0000 [IU] | Freq: Once | SUBCUTANEOUS | Status: AC
Start: 2016-07-22 — End: 2016-07-22
  Administered 2016-07-22: 4 [IU] via SUBCUTANEOUS
  Filled 2016-07-22: qty 1

## 2016-07-22 MED ORDER — METHYLPREDNISOLONE SODIUM SUCC 125 MG IJ SOLR
125.0000 mg | Freq: Once | INTRAMUSCULAR | Status: AC
  Administered 2016-07-22: 125 mg via INTRAVENOUS
  Filled 2016-07-22: qty 2

## 2016-07-22 MED ORDER — ACETAMINOPHEN 650 MG RE SUPP: 650.0000 mg | Freq: Four times a day (QID) | RECTAL | Status: DC | PRN

## 2016-07-22 MED ORDER — ALBUTEROL SULFATE (2.5 MG/3ML) 0.083% IN NEBU
2.5000 mg | INHALATION_SOLUTION | RESPIRATORY_TRACT | Status: DC
  Administered 2016-07-22 – 2016-07-23 (×2): 2.5 mg via RESPIRATORY_TRACT
  Filled 2016-07-22 (×2): qty 3

## 2016-07-22 MED ORDER — PREDNISONE 20 MG PO TABS
40.0000 mg | ORAL_TABLET | Freq: Every day | ORAL | Status: DC
  Administered 2016-07-23 – 2016-07-25 (×3): 40 mg via ORAL
  Filled 2016-07-22 (×4): qty 2

## 2016-07-22 MED ORDER — SODIUM CHLORIDE 0.9 % IV BOLUS (SEPSIS)
500.0000 mL | Freq: Once | INTRAVENOUS | Status: AC
  Administered 2016-07-22: 500 mL via INTRAVENOUS

## 2016-07-22 MED ORDER — ONDANSETRON HCL 4 MG PO TABS
4.0000 mg | ORAL_TABLET | Freq: Four times a day (QID) | ORAL | Status: DC | PRN
  Administered 2016-07-24: 4 mg via ORAL
  Filled 2016-07-22: qty 1

## 2016-07-22 MED ORDER — INSULIN ASPART 100 UNIT/ML ~~LOC~~ SOLN
0.0000 [IU] | SUBCUTANEOUS | Status: DC
  Administered 2016-07-22: 10 [IU] via SUBCUTANEOUS
  Administered 2016-07-22: 20 [IU] via SUBCUTANEOUS
  Administered 2016-07-23 (×2): 15 [IU] via SUBCUTANEOUS
  Filled 2016-07-22: qty 1

## 2016-07-22 MED ORDER — SODIUM CHLORIDE 0.9% FLUSH
3.0000 mL | Freq: Two times a day (BID) | INTRAVENOUS | Status: DC
  Administered 2016-07-22 – 2016-07-25 (×6): 3 mL via INTRAVENOUS

## 2016-07-22 MED ORDER — ACETAMINOPHEN 325 MG PO TABS: 650.0000 mg | ORAL_TABLET | Freq: Four times a day (QID) | ORAL | Status: DC | PRN

## 2016-07-22 MED ORDER — FAMOTIDINE 20 MG PO TABS
20.0000 mg | ORAL_TABLET | Freq: Two times a day (BID) | ORAL | Status: DC
  Administered 2016-07-22 – 2016-07-25 (×6): 20 mg via ORAL
  Filled 2016-07-22 (×6): qty 1

## 2016-07-22 MED ORDER — ONDANSETRON HCL 4 MG/2ML IJ SOLN: 4.0000 mg | Freq: Four times a day (QID) | INTRAMUSCULAR | Status: DC | PRN

## 2016-07-22 NOTE — ED Notes (Signed)
Unsuccessful attempt at giving report.  

## 2016-07-22 NOTE — ED Notes (Signed)
Pharmacy tech at bedside 

## 2016-07-22 NOTE — ED Notes (Signed)
Spoke with admitting MD and he instructed me to give patient 10 units. He got this from the endochronologist note. He states that they can correct insulin up on floor and all floor orders have been released and patient upstairs

## 2016-07-22 NOTE — H&P (Signed)
Date: 07/22/2016               Patient Name:  Gavin SkeetersLisa M Smith MRN: [id number]  DOB: 11-Mar-1976 Age / Sex: 40 y.o., male   PCP: Mila HomerStephen D. Orvan Falconerampbell, MD         Medical Service: Internal Medicine Teaching Service         Attending Physician: Dr. Tilden FossaElizabeth Rees, MD    First Contact: Dr. Reymundo Pollarolyn Arliene Rosenow Pager: [phone]  Second Contact: Dr. Deneise LeverParth Saraiya  Pager: [phone]       After Hours (After 5p/  First Contact Pager: [phone]  weekends / holidays): Second Contact Pager: [phone]   Chief Complaint: SOB  History of Present Illness: Patient is a 40 yo F pmhx significant for asthma, HTN, Type I DM, and autonomic neuropathy of her GI tract s/p partial colectomy who presents with SOB and wheezing. Patient reports a history of asthma since the age of 4. 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  She reports she usually gets asthma exacerbations requiring hospitalization about once a year, but since moving here hasn't had one in almost 3 years. Cold weather is a trigger for her asthma and over the past few days her wheezing SOB has progressively worsened with the weather change. She has been using her home Pulmicort daily and rescue inhaler every 4 hours without relief. She went to an urgent care and was prescribed a course of Levaquin which she finished yesterday without relief. She reports she has had a poor appetite and hasn't been drinking enough water. This morning she woke up on the floor. She said she felt a bit confused and dizzy and does not remember how she got on there. She then presented to the ED for evaluation. On ROS she denies fevers, chest pain, abdominal pain and nausea. She dose endorse a couple episodes of emesis from coughing too hard. She endorses wheezing, chest tightness, and a dry non productive cough. Of note, she did mention that she has been having symptoms of acid reflux recently for which she is taking tums. She has never had this  problem before.   In the ED, patient was tachycardic with HR 145, RR 24, BP 134/98, Oxygen 100% on RA and T 98 F. Labs were significant for a leukocytosis of 13.4. CBC and CMP were otherwise unremarkable. I-stat troponin was 0.00.  UPT was negative. CXR was normal. She received received nebulized albuterol, ipratropium, and IV solumedrol 125 mg x 1 in the ED. CBG was elevated to 314 and she was given 4 units of novolog.   Meds:  No outpatient prescriptions have been marked as taking for the 07/22/16 encounter [facility](Hospital Encounter).     Allergies: Allergies as of 07/22/2016 - Review Complete 07/22/2016  Allergen Reaction Noted  . Fenofibrate Other (See Comments) 09/24/2014  . Humalog [insulin lispro] Other (See Comments) 09/24/2014  . Morphine and related  03/23/2011  . Penicillins  03/23/2011   Past Medical History:  Diagnosis Date  . Allergic rhinitis, cause unspecified   . Essential hypertension, benign   . Extrinsic asthma, unspecified   . Generalized osteoarthrosis, involving multiple sites   . Insomnia, unspecified   . Migraine, unspecified, without mention of intractable migraine without mention of status migrainosus   . Pityriasis rosea   . Pure hypercholesterolemia   . Type I (juvenile type) diabetes mellitus without mention of complication, not stated as uncontrolled   .  Unspecified sleep apnea     Family History: Asthma in her brother. Multiple relatives with type I DM, HTN, and HLD.   Social History: Lives in [place], [place]. She lives alone and has a diabetic service dog. She denies alcohol, tobacco, and illicit drug use.   Review of Systems: A complete ROS was negative except as per HPI.   Physical Exam: Blood pressure 117/75, pulse (!) 125, temperature 98 F (36.7 C), temperature source Oral, resp. rate 16, SpO2 99 %. Constitutional: Obese, NAD, appears comfortable HEENT: Atraumatic, normocephalic. PERRL, anicteric sclera.  Neck: Supple, trachea midline.    Cardiovascular: Tachycardic but regular rhythm, no murmurs, rubs, or gallops.  Pulmonary/Chest: Mild expiratory wheezing in her upper lung fields.  Abdominal: Soft, non tender, non distended. +BS. Ostomy bag in place.  Extremities: Warm and well perfused. Distal pulses intact. No edema.  Neurological: A&Ox3, CN II - XII grossly intact.  Skin: No rashes or erythema  Psychiatric: Normal mood and affect  EKG: Personally reviewed. Sinus tach with rate of 124. No ischemic changes. No priors for comparison.   CXR: Personally reviewed. Poor inspiratory effort. No evidence of infiltrate or consolidation.   Assessment & Plan by Problem: Patient is a 40 yo F pmhx of HTN, type I DM, and asthma who presents with progressive wheezing and SOB x 9 days admitted for further treatment of her asthma exacerbation.  Asthma Exacerbation: Reports a history of asthma since the age of 4. Has been using her home albuterol and Pulmicort without relief. Patient believes her symptoms were brought on by the cold weather change. She endorses wheezing, SOB, and a dry non productive cough x 9 days. Recently completed a course of Levaquin without relief. She denies any sick contacts. She does reports symptoms of acid reflux which are new. She has been taking tums with partial relief. On evaluation she has a mild leukocytosis of 13 and CXR was normal.  -- Nebulized albuterol q3h prn  -- Prednisone 40 mg x 5 days (starting tomorrow)  -- Continue home Pulmicort  -- Pepcid 20 mg BID  Type I DM: Takes 22 units of levemir in the morning and at night in addition to a sliding scale mealtime insulin.  -- 30 Lantus QHS -- SSI resistant  -- CBG monitoring q4 hours -- A1C pending   Autonomic Neuropathy of the GI Tract:  S/p partial colectomy in April of 2016. Ostomy bag in place. Follows with an outside gastroenterologist. Likely secondary to her type I diabetes however her records are not available in our system.  -- Monitor for  now  Tachycardia: HR elevated to 140s in the ED. Patient reports she is chronically tachycardic and lives in the 110-120s range due to her autonomic dysfunction (?). Again, we do not have access to her outside records thus this diagnosis is unclear. But per patient she is always tachycardic and this is normal for her. She is not hypoxic and I feel that PE is unlikely given the nature of her symptoms and his long standing history of asthma. She denies every having a blood clot in the past and denies a family history of PE. She has no other risk factors.  -- Monitor for now  HTN: Normotensive on admission, reports compliance with home meds.  -- Continue lisinopril 10 mg daily  HLD: -- Continue Lipitor 10 mg every other day   Chronic Back Pain: S/p spinal fusion L5-S1 now with chronic lower extremity weakness. Takes tramadol every night for sleep and  neuropathy per her neurologist.  -- Continue home tramadol and tizanidine 2 mg QHS  FEN: Encourage PO intake, replete lytes prn, heart healthy diet  VTE ppx: Heparin  Code Status: FULL   Dispo: Admit patient to Observation with expected length of stay less than 2 midnights.  Signed: Reymundo Poll, MD 07/22/2016, 12:42 PM  Pager: [phone]

## 2016-07-22 NOTE — ED Notes (Signed)
Gave Pt water 3X

## 2016-07-22 NOTE — ED Notes (Signed)
CBG: 314 RN notified

## 2016-07-22 NOTE — ED Notes (Signed)
Ordered dinner tray for patient.

## 2016-07-22 NOTE — ED Notes (Signed)
Notified respiratory about hout long neb treatment

## 2016-07-22 NOTE — Progress Notes (Signed)
Patient received from ED and oriented to room. Tele monitoring and skin assessment completed. VSS, call light within reach

## 2016-07-22 NOTE — ED Provider Notes (Signed)
[facility] DEPT Provider Note   CSN: [id number] Arrival date & time: 07/22/16  0848     History   Chief Complaint Chief Complaint  Patient presents with  . Asthma    HPI Gavin Smith is a 40 y.o. male.  The history is provided by the patient. No language interpreter was used.  Asthma    Gavin Smith is a 40 y.o. male who presents to the Emergency Department complaining of sob.  She has a history of asthma since the age of 4. Over the last 9 days she endorses increased shortness of breath with cough that is nonproductive. She has associated chest tightness. She is used her Pulmicort and albuterol at home with no improvement. She endorses progressive and worsening symptoms. She denies any fevers, leg swelling or pain, vomiting. Past Medical History:  Diagnosis Date  . Allergic rhinitis, cause unspecified   . Essential hypertension, benign   . Extrinsic asthma, unspecified   . Generalized osteoarthrosis, involving multiple sites   . Insomnia, unspecified   . Migraine, unspecified, without mention of intractable migraine without mention of status migrainosus   . Pityriasis rosea   . Pure hypercholesterolemia   . Type I (juvenile type) diabetes mellitus without mention of complication, not stated as uncontrolled   . Unspecified sleep apnea     Patient Active Problem List   Diagnosis Date Noted  . Asthma exacerbation 07/22/2016  . Vitamin D deficiency 11/28/2014  . Extrinsic asthma, unspecified   . Generalized osteoarthrosis, involving multiple sites   . Extrinsic asthma, unspecified   . Uncontrolled type 2 diabetes mellitus with peripheral neuropathy ([place])   . Pure hypercholesterolemia   . Essential hypertension, benign     Past Surgical History:  Procedure Laterality Date  . BREAST REDUCTION SURGERY  2005  . CHOLECYSTECTOMY  2000  . RHINOPLASTY  2005  . SPINAL FUSION  2006   L5-S1 spinal fusion    OB History    No data available       Home  Medications    Prior to Admission medications   Medication Sig Start Date End Date Taking? Authorizing Provider  ACCU-CHEK AVIVA PLUS test strip USE TO CHECK BLOOD SUGAR FIVE TIMES A DAY AS DIRECTED 03/25/16  Yes Philemon Kingdom, MD  ACCU-CHEK FASTCLIX LANCETS MISC USE TO CHECK BLOOD SUGAR THREE TIMES A DAY 02/05/16  Yes Philemon Kingdom, MD  albuterol (PROAIR HFA) 108 (90 BASE) MCG/ACT inhaler Inhale 1 puff into the lungs every 4 (four) hours as needed for wheezing.    Yes Historical Provider, MD  albuterol (PROVENTIL) (2.5 MG/3ML) 0.083% nebulizer solution Take 2.5 mg by nebulization every 6 (six) hours as needed for wheezing.    Yes Historical Provider, MD  atorvastatin (LIPITOR) 10 MG tablet Take 10 mg by mouth daily.  12/07/14  Yes Historical Provider, MD  B-D ULTRAFINE III SHORT PEN 31G X 8 MM MISC  08/27/14  Yes Historical Provider, MD  B-D ULTRAFINE III SHORT PEN 31G X 8 MM MISC use for 5 INJECTIONS daily 03/25/16  Yes Philemon Kingdom, MD  Blood Glucose Monitoring Suppl (ACCU-CHEK AVIVA CONNECT) W/DEVICE KIT 1 each by Does not apply route daily. Dx: E11.42 02/28/15  Yes Philemon Kingdom, MD  CALCIUM PO Take 1 tablet by mouth daily.   Yes Historical Provider, MD  insulin aspart (NOVOLOG FLEXPEN) 100 UNIT/ML FlexPen Inject 50 units into the skin QID Patient taking differently: Inject 0-50 Units into the skin 4 (four) times daily. Per sliding scale  05/27/16  Yes Philemon Kingdom, MD  Insulin Syringe-Needle U-100 31G X 5/16" 0.3 ML MISC Use to inject insulin 3 times daily as directed. 08/18/15  Yes Philemon Kingdom, MD  LEVEMIR FLEXTOUCH 100 UNIT/ML Pen inject 42 units subcutaneously every morning Patient taking differently: inject 22 units subcutaneously twice daily 06/22/16  Yes Philemon Kingdom, MD  lisinopril (PRINIVIL,ZESTRIL) 10 MG tablet Take 10 mg by mouth daily.     Yes Historical Provider, MD  loratadine (CLARITIN) 10 MG tablet Take 10 mg by mouth daily.     Yes Historical Provider, MD    Multiple Vitamin (MULTI-DAY PO) Take 1 tablet by mouth daily.     Yes Historical Provider, MD  PULMICORT 0.5 MG/2ML nebulizer solution Take 2 mLs by nebulization daily as needed for wheezing. 04/26/16  Yes Historical Provider, MD  sulfamethoxazole-trimethoprim (BACTRIM DS) 800-160 MG per tablet Take 1 tablet by mouth daily.  09/20/11  Yes Historical Provider, MD  tiZANidine (ZANAFLEX) 2 MG tablet Take 2 mg by mouth every 8 (eight) hours as needed for muscle spasms.    Yes Historical Provider, MD  traMADol (ULTRAM) 50 MG tablet Take 100 mg by mouth at bedtime.    Yes Historical Provider, MD  vitamin C (ASCORBIC ACID) 500 MG tablet Take 500 mg by mouth daily.     Yes Historical Provider, MD  Vitamin D, Ergocalciferol, (DRISDOL) 50000 units CAPS capsule Take 1 capsule (50,000 Units total) by mouth daily. 03/26/16  Yes Philemon Kingdom, MD  amitriptyline (ELAVIL) 25 MG tablet Take 25 mg by mouth. 04/10/15 04/09/16  Historical Provider, MD  DULoxetine (CYMBALTA) 30 MG capsule Take 30 mg by mouth. 02/27/15 02/27/16  Historical Provider, MD    Family History Family History  Problem Relation Age of Onset  . Diabetes Mother   . Hypertension Mother   . Hyperlipidemia Mother   . Hypertension Father   . Hyperlipidemia Father   . Diabetes Maternal Grandmother   . Cancer Maternal Grandmother     Breast Cancer  . Hypertension Maternal Grandfather   . Heart disease Maternal Grandfather   . Heart disease Paternal Grandfather   . Cancer Other     Ovarian Cancer-Grandparent    Social History Social History  Substance Use Topics  . Smoking status: Never Smoker  . Smokeless tobacco: Not on file  . Alcohol use No     Allergies   Fenofibrate; Humalog [insulin lispro]; Morphine and related; and Penicillins   Review of Systems Review of Systems  All other systems reviewed and are negative.    Physical Exam Updated Vital Signs BP 123/74 (BP Location: Right Arm)   Pulse 120   Temp 98 F (36.7 C)  (Oral)   Resp 23   SpO2 97%   Physical Exam  Constitutional: She is oriented to person, place, and time. She appears well-developed and well-nourished.  HENT:  Head: Normocephalic and atraumatic.  Cardiovascular: Regular rhythm.   No murmur heard. Tachycardic  Pulmonary/Chest:  Tachypnea with decreased air movement bilaterally, occasional and expiratory wheezes.  Abdominal: Soft. There is no tenderness. There is no rebound and no guarding.  Musculoskeletal: She exhibits no edema or tenderness.  Neurological: She is alert and oriented to person, place, and time.  Skin: Skin is warm and dry.  Psychiatric: She has a normal mood and affect. Her behavior is normal.  Nursing note and vitals reviewed.    ED Treatments / Results  Labs (all labs ordered are listed, but only abnormal results are displayed) Labs  Reviewed  CBC WITH DIFFERENTIAL/PLATELET - Abnormal; Notable for the following:       Result Value   WBC 13.4 (*)    Neutro Abs 9.7 (*)    All other components within normal limits  BASIC METABOLIC PANEL - Abnormal; Notable for the following:    Glucose, Bld 355 (*)    All other components within normal limits  CBG MONITORING, ED - Abnormal; Notable for the following:    Glucose-Capillary 314 (*)    All other components within normal limits  CBG MONITORING, ED - Abnormal; Notable for the following:    Glucose-Capillary 455 (*)    All other components within normal limits  I-STAT TROPOININ, ED  POC URINE PREG, ED  CBG MONITORING, ED    EKG  EKG Interpretation  Date/Time:  Thursday July 22 2016 09:32:26 EST Ventricular Rate:  124 PR Interval:    QRS Duration: 76 QT Interval:  301 QTC Calculation: 433 R Axis:   65 Text Interpretation:  Sinus tachycardia Borderline T abnormalities, inferior leads Confirmed by Hazle Coca [phone]) on 07/22/2016 9:42:41 AM       Radiology Dg Chest Port 1 View  Result Date: 07/22/2016 CLINICAL DATA:  40 year old male with  history of asthma exacerbation yesterday evening. Possible syncope. EXAM: PORTABLE CHEST 1 VIEW COMPARISON:  Chest x-ray 06/09/2014. FINDINGS: Film is underpenetrated limiting the diagnostic sensitivity and specificity of the examination. With these limitations in mind lung volumes are low, and there does not appear to be consolidative airspace disease. No pleural effusions. No pneumothorax. No pulmonary nodule or mass noted. Pulmonary vasculature and the cardiomediastinal silhouette are within normal limits allowing for low lung volumes and portable AP technique. IMPRESSION: 1. Low lung volumes without radiographic evidence of acute cardiopulmonary disease. Electronically Signed   By: Vinnie Langton M.D.   On: 07/22/2016 10:59    Procedures Procedures (including critical care time)  Medications Ordered in ED Medications  albuterol (PROVENTIL,VENTOLIN) solution continuous neb (0 mg/hr Nebulization Stopped 07/22/16 1105)  insulin aspart (novoLOG) injection 0-20 Units (10 Units Subcutaneous Given 07/22/16 1639)  ipratropium (ATROVENT) nebulizer solution 0.5 mg (0.5 mg Nebulization Given 07/22/16 1005)  methylPREDNISolone sodium succinate (SOLU-MEDROL) 125 mg/2 mL injection 125 mg (125 mg Intravenous Given 07/22/16 0953)  insulin aspart (novoLOG) injection 4 Units (4 Units Subcutaneous Given 07/22/16 1215)  sodium chloride 0.9 % bolus 500 mL (0 mLs Intravenous Stopped 07/22/16 1638)     Initial Impression / Assessment and Plan / ED Course  I have reviewed the triage vital signs and the nursing notes.  Pertinent labs & imaging results that were available during my care of the patient were reviewed by me and considered in my medical decision making (see chart for details).  Clinical Course     Patient with history of diabetes and asthma here with increasing shortness of breath over the last 9 days. She has tachypnea and wheezes on examination. She is partially improved on repeat assessment  following albuterol treatment. Plan to admit for ongoing treatment for asthma exacerbation. Patient is in agreement with plan.  Final Clinical Impressions(s) / ED Diagnoses   Final diagnoses:  None    New Prescriptions New Prescriptions   No medications on file     Quintella Reichert, MD 07/22/16 1735

## 2016-07-22 NOTE — ED Notes (Signed)
CBG: 455 RN notified

## 2016-07-22 NOTE — ED Triage Notes (Signed)
Pt reports having asthma exacerbation starting last night. Pt states that cold weather makes her symptoms increase. Pt states that she thinks that she passed out last night after coughing a lot. Pt denies any cold symptoms. Wheezing noted in triage. Pt last used albuterol at 8am

## 2016-07-23 DIAGNOSIS — E1169 Type 2 diabetes mellitus with other specified complication: Secondary | ICD-10-CM

## 2016-07-23 DIAGNOSIS — E1043 Type 1 diabetes mellitus with diabetic autonomic (poly)neuropathy: Secondary | ICD-10-CM | POA: Diagnosis not present

## 2016-07-23 DIAGNOSIS — M545 Low back pain: Secondary | ICD-10-CM

## 2016-07-23 DIAGNOSIS — IMO0002 Reserved for concepts with insufficient information to code with codable children: Secondary | ICD-10-CM

## 2016-07-23 DIAGNOSIS — D72829 Elevated white blood cell count, unspecified: Secondary | ICD-10-CM | POA: Diagnosis not present

## 2016-07-23 DIAGNOSIS — E7849 Other hyperlipidemia: Secondary | ICD-10-CM

## 2016-07-23 DIAGNOSIS — M79606 Pain in leg, unspecified: Secondary | ICD-10-CM

## 2016-07-23 DIAGNOSIS — Z825 Family history of asthma and other chronic lower respiratory diseases: Secondary | ICD-10-CM

## 2016-07-23 DIAGNOSIS — G8929 Other chronic pain: Secondary | ICD-10-CM

## 2016-07-23 DIAGNOSIS — Z981 Arthrodesis status: Secondary | ICD-10-CM

## 2016-07-23 DIAGNOSIS — R000 Tachycardia, unspecified: Secondary | ICD-10-CM | POA: Diagnosis not present

## 2016-07-23 DIAGNOSIS — I10 Essential (primary) hypertension: Secondary | ICD-10-CM

## 2016-07-23 DIAGNOSIS — Z8349 Family history of other endocrine, nutritional and metabolic diseases: Secondary | ICD-10-CM

## 2016-07-23 DIAGNOSIS — Z9049 Acquired absence of other specified parts of digestive tract: Secondary | ICD-10-CM

## 2016-07-23 DIAGNOSIS — Z833 Family history of diabetes mellitus: Secondary | ICD-10-CM

## 2016-07-23 DIAGNOSIS — E785 Hyperlipidemia, unspecified: Secondary | ICD-10-CM

## 2016-07-23 DIAGNOSIS — J45901 Unspecified asthma with (acute) exacerbation: Secondary | ICD-10-CM | POA: Diagnosis not present

## 2016-07-23 DIAGNOSIS — K319 Disease of stomach and duodenum, unspecified: Secondary | ICD-10-CM

## 2016-07-23 DIAGNOSIS — Z8249 Family history of ischemic heart disease and other diseases of the circulatory system: Secondary | ICD-10-CM

## 2016-07-23 LAB — COMPREHENSIVE METABOLIC PANEL
ALT: 28 U/L (ref 14–54)
AST: 25 U/L (ref 15–41)
Albumin: 3.9 g/dL (ref 3.5–5.0)
Alkaline Phosphatase: 59 U/L (ref 38–126)
Anion gap: 12 (ref 5–15)
BUN: 14 mg/dL (ref 6–20)
CHLORIDE: 101 mmol/L (ref 101–111)
CO2: 20 mmol/L — ABNORMAL LOW (ref 22–32)
CREATININE: 0.84 mg/dL (ref 0.44–1.00)
Calcium: 10 mg/dL (ref 8.9–10.3)
Glucose, Bld: 327 mg/dL — ABNORMAL HIGH (ref 65–99)
POTASSIUM: 4.5 mmol/L (ref 3.5–5.1)
Sodium: 133 mmol/L — ABNORMAL LOW (ref 135–145)
Total Bilirubin: 1.6 mg/dL — ABNORMAL HIGH (ref 0.3–1.2)
Total Protein: 7.3 g/dL (ref 6.5–8.1)

## 2016-07-23 LAB — GLUCOSE, CAPILLARY
GLUCOSE-CAPILLARY: 235 mg/dL — AB (ref 65–99)
GLUCOSE-CAPILLARY: 314 mg/dL — AB (ref 65–99)
GLUCOSE-CAPILLARY: 353 mg/dL — AB (ref 65–99)
GLUCOSE-CAPILLARY: 424 mg/dL — AB (ref 65–99)
GLUCOSE-CAPILLARY: 478 mg/dL — AB (ref 65–99)
Glucose-Capillary: 206 mg/dL — ABNORMAL HIGH (ref 65–99)
Glucose-Capillary: 312 mg/dL — ABNORMAL HIGH (ref 65–99)
Glucose-Capillary: 343 mg/dL — ABNORMAL HIGH (ref 65–99)

## 2016-07-23 LAB — HEMOGLOBIN A1C
HEMOGLOBIN A1C: 7.8 % — AB (ref 4.8–5.6)
MEAN PLASMA GLUCOSE: 177 mg/dL

## 2016-07-23 LAB — CBC
HCT: 40.9 % (ref 36.0–46.0)
Hemoglobin: 14.1 g/dL (ref 12.0–15.0)
MCH: 31 pg (ref 26.0–34.0)
MCHC: 34.5 g/dL (ref 30.0–36.0)
MCV: 89.9 fL (ref 78.0–100.0)
PLATELETS: 243 10*3/uL (ref 150–400)
RBC: 4.55 MIL/uL (ref 3.87–5.11)
RDW: 13.1 % (ref 11.5–15.5)
WBC: 12.3 10*3/uL — ABNORMAL HIGH (ref 4.0–10.5)

## 2016-07-23 LAB — TSH: TSH: 0.762 u[IU]/mL (ref 0.350–4.500)

## 2016-07-23 LAB — INFLUENZA PANEL BY PCR (TYPE A & B)
INFLAPCR: NEGATIVE
Influenza B By PCR: NEGATIVE

## 2016-07-23 MED ORDER — ALBUTEROL SULFATE (2.5 MG/3ML) 0.083% IN NEBU
2.5000 mg | INHALATION_SOLUTION | RESPIRATORY_TRACT | Status: DC | PRN
  Administered 2016-07-24: 2.5 mg via RESPIRATORY_TRACT
  Filled 2016-07-23 (×2): qty 3

## 2016-07-23 MED ORDER — INSULIN DETEMIR 100 UNIT/ML ~~LOC~~ SOLN
20.0000 [IU] | Freq: Two times a day (BID) | SUBCUTANEOUS | Status: DC
  Filled 2016-07-23: qty 0.2

## 2016-07-23 MED ORDER — INSULIN ASPART 100 UNIT/ML ~~LOC~~ SOLN
0.0000 [IU] | Freq: Every day | SUBCUTANEOUS | Status: DC
  Administered 2016-07-23: 2 [IU] via SUBCUTANEOUS
  Administered 2016-07-24: 4 [IU] via SUBCUTANEOUS

## 2016-07-23 MED ORDER — ALBUTEROL SULFATE (2.5 MG/3ML) 0.083% IN NEBU
2.5000 mg | INHALATION_SOLUTION | Freq: Four times a day (QID) | RESPIRATORY_TRACT | Status: DC
  Administered 2016-07-23 (×3): 2.5 mg via RESPIRATORY_TRACT
  Filled 2016-07-23 (×3): qty 3

## 2016-07-23 MED ORDER — INSULIN ASPART 100 UNIT/ML ~~LOC~~ SOLN
4.0000 [IU] | Freq: Three times a day (TID) | SUBCUTANEOUS | Status: DC
  Administered 2016-07-23 – 2016-07-25 (×5): 4 [IU] via SUBCUTANEOUS

## 2016-07-23 MED ORDER — ALBUTEROL SULFATE (2.5 MG/3ML) 0.083% IN NEBU
2.5000 mg | INHALATION_SOLUTION | Freq: Three times a day (TID) | RESPIRATORY_TRACT | Status: DC
  Administered 2016-07-24 – 2016-07-25 (×4): 2.5 mg via RESPIRATORY_TRACT
  Filled 2016-07-23 (×3): qty 3

## 2016-07-23 MED ORDER — INSULIN ASPART 100 UNIT/ML ~~LOC~~ SOLN
0.0000 [IU] | Freq: Three times a day (TID) | SUBCUTANEOUS | Status: DC
  Administered 2016-07-23: 15 [IU] via SUBCUTANEOUS
  Administered 2016-07-24 (×2): 11 [IU] via SUBCUTANEOUS
  Administered 2016-07-25: 4 [IU] via SUBCUTANEOUS
  Administered 2016-07-25: 7 [IU] via SUBCUTANEOUS

## 2016-07-23 MED ORDER — INSULIN ASPART 100 UNIT/ML ~~LOC~~ SOLN
15.0000 [IU] | Freq: Once | SUBCUTANEOUS | Status: AC
  Administered 2016-07-23: 15 [IU] via SUBCUTANEOUS

## 2016-07-23 MED ORDER — ALBUTEROL SULFATE (2.5 MG/3ML) 0.083% IN NEBU
INHALATION_SOLUTION | RESPIRATORY_TRACT | Status: AC
  Filled 2016-07-23: qty 3

## 2016-07-23 MED ORDER — INSULIN DETEMIR 100 UNIT/ML ~~LOC~~ SOLN
25.0000 [IU] | Freq: Two times a day (BID) | SUBCUTANEOUS | Status: DC
  Administered 2016-07-23 – 2016-07-25 (×5): 25 [IU] via SUBCUTANEOUS
  Filled 2016-07-23 (×5): qty 0.25

## 2016-07-23 NOTE — Progress Notes (Signed)
Results for Lusignan, Rosezella FloridaLISA M (MRN [id number]) as of 07/23/2016 09:41  Ref. Range 07/22/2016 21:43 07/23/2016 00:20 07/23/2016 02:34 07/23/2016 04:09 07/23/2016 08:19  Glucose-Capillary Latest Ref Range: 65 - 99 mg/dL 409499 (H) 811424 (H) 914353 (H) 314 (H) 312 (H)  Noted that blood sugars continue to be greater than 300 mg/dl. Patient takes Levemir 22 units BID at home.  Recommend changing to Levemir 20 units BID, continue Novolog correction scale TID & HS since patient is eating.  and add Novolog 4 units TID with meals. Smith MinceKendra Jaelen Soth RN BSN CDE

## 2016-07-23 NOTE — Progress Notes (Signed)
Per physician on call. Gave 20 units at 2210. 0000 blood sugar is now 424. Per physician, wait another two hours until fast acting peaks and then check it again.

## 2016-07-23 NOTE — Progress Notes (Addendum)
Subjective: Patient feels that her breathing as improved but is not back to baseline. Continues to have coughing spells and feels lightheaded when she gets up to walk around.   Objective:  Vital signs in last 24 hours: Vitals:   07/22/16 1502 07/22/16 2033 07/23/16 0505 07/23/16 0823  BP: 123/74 120/76 113/78 118/85  Pulse: 120 (!) 122 (!) 110 99  Resp: 23 20 20 18   Temp:  98.9 F (37.2 C) 98.2 F (36.8 C) 98.8 F (37.1 C)  TempSrc:  Oral Oral Oral  SpO2: 97% 98% 100% 99%  Weight:  (!) 314 lb 14.4 oz (142.8 kg)    Height:  5\' 8"  (1.727 m)     Physical Exam Constitutional: Obese, NAD, appears comfortable HEENT: Atraumatic, normocephalic. PERRL, anicteric sclera.  Neck: Supple, trachea midline.  Cardiovascular: Tachycardic but regular rhythm, no murmurs, rubs, or gallops.  Pulmonary/Chest: Scattered expiratory wheezing - improved  Abdominal: Soft, non tender, non distended. +BS. Ostomy bag in place. Extremities: Warm and well perfused. Distal pulses intact. No edema.  Neurological: A&Ox3, CN II - XII grossly intact.  Skin: No rashes or erythema  Psychiatric: Normal mood and affect  Assessment/Plan: Patient is a 40 yo F pmhx of HTN, type I DM, and asthma with progressive wheezing and SOB x 9 days who presented after a syncopal episode from coughing, admitted for further treatment of her asthma exacerbation.  Asthma Exacerbation: Symptoms improving with albuterol nebs but not back to baseline yet. She is breathing comfortably on room air. Continues to endorse severe coughing spells and feeling lightheaded with exertion.  -- Prednisone 40 mg x 5 days -- Continue Albuterol nebs q6 hours scheduled; and q4 hours prn -- Continue home pulmicort BID  GERD: -- Continue pepcid 20 mg BID  Type I DM: Takes 22 units of levemir in the morning and at night in addition to a sliding scale mealtime insulin. Appreciate DM coordinator recs. CBG have been elevated in the 300 - 400 range. Paged  for CBG 478 and gave an additional 15 units of novolog with her 4 units meal time dose at lunch.  -- Changed 30 units lantus ->  Levemir 20 units BID -- SSI resistant TID & HS -- Added Novolog 4 units TID with meals  -- Continue CBG monitoring  Autonomic Neuropathy of the GI Tract:  S/p partial colectomy in April of 2016. Ostomy bag in place. Follows with an outside gastroenterologist. Likely secondary to her type I diabetes however her records are not available in our system.  -- Monitor for now  Tachycardia: HR elevated to 140s in the ED. Patient reports she is chronically tachycardic and lives in the 110-120s range due to her autonomic dysfunction (?). Again, we do not have access to her outside records thus this diagnosis is unclear. TSH was checked today and found to be normal.  -- Monitor for now -- F/u with PCP   HTN: Normotensive on admission, reports compliance with home meds.  -- Continue lisinopril 10 mg daily  HLD: -- Continue Lipitor 10 mg every other day   Chronic Back Pain: S/p spinal fusion L5-S1 now with chronic lower extremity weakness. Takes tramadol every night for sleep and neuropathy per her neurologist.  -- Continue home tramadol and tizanidine 2 mg QHS  FEN: Encourage PO intake, replete lytes prn, heart healthy diet  VTE ppx: Heparin  Code Status: FULL   Dispo: Anticipated discharge tomorrow pending improvement in symptoms.   Gavin Pollarolyn Johnmatthew Solorio, MD 07/23/2016, 10:27 AM  Pager: [phone]

## 2016-07-23 NOTE — Evaluation (Signed)
Occupational Therapy Evaluation and Discharge Patient Details Name: Gavin Smith MRN: [id number] DOB: 04-23-1976 Today's Date: 07/23/2016    History of Present Illness 40 yo F pmhx significant for asthma, HTN, Type I DM, and autonomic neuropathy of her GI tract s/p partial colectomy who presents with SOB and wheezing. Patient reports a history of asthma since the age of 4.   Clinical Impression   Pt reports she was modified independent with ADL PTA. Currently pt supervision for ADL and functional mobility. Educated pt on energy conservation and fall prevention strategies; pt verbalized understanding. Pt planning to d/c home with intermittent supervision from family/friends. No further acute OT needs identified; signing off at this time. Please re-consult if needs change. Thank you for this referral.    Follow Up Recommendations  No OT follow up    Equipment Recommendations  None recommended by OT    Recommendations for Other Services PT consult     Precautions / Restrictions Precautions Precautions: None Precaution Comments: has bil AFOs; does not wear them often. pt reports hx of falls PTA. Restrictions Weight Bearing Restrictions: No      Mobility Bed Mobility Overal bed mobility: Modified Independent             General bed mobility comments: HOB elevated; no physical assist required.  Transfers Overall transfer level: Needs assistance Equipment used: None Transfers: Sit to/from Stand Sit to Stand: Supervision         General transfer comment: Supervision for safety; no physical assist required. Mild dizziness initially; resolved within 30 seconds.    Balance Overall balance assessment: Needs assistance Sitting-balance support: Feet supported;No upper extremity supported Sitting balance-Leahy Scale: Normal     Standing balance support: No upper extremity supported;During functional activity Standing balance-Leahy Scale: Good                               ADL Overall ADL's : Needs assistance/impaired Eating/Feeding: Independent;Sitting   Grooming: Supervision/safety;Standing   Upper Body Bathing: Set up;Sitting   Lower Body Bathing: Supervison/ safety;Sit to/from stand   Upper Body Dressing : Set up;Sitting   Lower Body Dressing: Supervision/safety;Sit to/from stand   Toilet Transfer: Supervision/safety;Ambulation;Regular Teacher, adult educationToilet Toilet Transfer Details (indicate cue type and reason): Simulated by sit to stand from EOB with functional mobility.         Functional mobility during ADLs: Supervision/safety General ADL Comments: Pt reports mild dizziness with initial sit to stand; resolved within 30 seconds. Educated pt on energy conservation strategies and fall prevention; pt verbalized understanding.     Vision Vision Assessment?: No apparent visual deficits   Perception     Praxis      Pertinent Vitals/Pain Pain Assessment: 0-10 Pain Score: 4  Pain Location: ribs from coughing Pain Descriptors / Indicators: Sore Pain Intervention(s): Monitored during session     Hand Dominance     Extremity/Trunk Assessment Upper Extremity Assessment Upper Extremity Assessment: Overall WFL for tasks assessed   Lower Extremity Assessment Lower Extremity Assessment: Defer to PT evaluation   Cervical / Trunk Assessment Cervical / Trunk Assessment: Other exceptions Cervical / Trunk Exceptions: hx of spinal sx, obesity   Communication Communication Communication: No difficulties   Cognition Arousal/Alertness: Awake/alert Behavior During Therapy: WFL for tasks assessed/performed Overall Cognitive Status: Within Functional Limits for tasks assessed                     General Comments  Exercises       Shoulder Instructions      Home Living Family/patient expects to be discharged to:: Private residence Living Arrangements: Alone Available Help at Discharge: Family;Friend(s);Available  PRN/intermittently Type of Home: Apartment Home Access: Level entry     Home Layout: One level     Bathroom Shower/Tub: Producer, television/film/videoWalk-in shower   Bathroom Toilet: Standard     Home Equipment: Crutches;Shower seat;Grab bars - toilet;Grab bars - tub/shower;Wheelchair - manual   Additional Comments: Handicapped accessible apartment. Has diabetic service dog.      Prior Functioning/Environment Level of Independence: Independent with assistive device(s)        Comments: crutches vs w/c for community mobility. drives.         OT Problem List:     OT Treatment/Interventions:      OT Goals(Current goals can be found in the care plan section) Acute Rehab OT Goals Patient Stated Goal: return home OT Goal Formulation: All assessment and education complete, DC therapy  OT Frequency:     Barriers to D/C:            Co-evaluation              End of Session    Activity Tolerance: Patient tolerated treatment well Patient left: in bed;with call bell/phone within reach (sitting EOB)   Time: 1001-1014 OT Time Calculation (min): 13 min Charges:  OT General Charges $OT Visit: 1 Procedure OT Evaluation $OT Eval Moderate Complexity: 1 Procedure G-Codes: OT G-codes **NOT FOR INPATIENT CLASS** Functional Assessment Tool Used: Clinical judgement Functional Limitation: Self care Self Care Current Status (W0981(G8987): At least 1 percent but less than 20 percent impaired, limited or restricted Self Care Goal Status ([id number](G8988): At least 1 percent but less than 20 percent impaired, limited or restricted Self Care Discharge Status [phone]([phone]): At least 1 percent but less than 20 percent impaired, limited or restricted   Gaye AlkenBailey A Derryck Shahan M.S., OTR/L Pager: [phone]  07/23/2016, 10:26 AM

## 2016-07-23 NOTE — Progress Notes (Signed)
Assess pt and changed Neb regimen per [facility] protocol assessment. Pt states that she feel fine no SOB noted at this time. She refuse 0300 scheduled neb. No acute distress noted at this time. Patient has scheduled nebs starting at 0800 and PRN nebs if needed for SOB/wheezing.

## 2016-07-23 NOTE — Evaluation (Signed)
Physical Therapy Evaluation Patient Details Name: Gavin Smith MRN: [id number] DOB: 05-Mar-1976 Today's Date: 07/23/2016   History of Present Illness  40 yo F pmhx significant for asthma, HTN, Type I DM, and autonomic neuropathy of her GI tract s/p partial colectomy who presents with SOB and wheezing. Patient reports a history of asthma since the age of 4.  Clinical Impression  Pt at baseline for mobility. Has history of falls for the past 11 years since back injury and surgery. Uses crutch or crutches at times. Also has w/c to use when needed. Pt does not like wearing her AFO's.    Follow Up Recommendations No PT follow up    Equipment Recommendations  None recommended by PT    Recommendations for Other Services       Precautions / Restrictions Precautions Precautions: None Precaution Comments: has bil AFOs; does not wear them often. pt reports hx of falls PTA. Restrictions Weight Bearing Restrictions: No      Mobility  Bed Mobility Overal bed mobility: Modified Independent             General bed mobility comments: HOB elevated; no physical assist required.  Transfers Overall transfer level: Modified independent Equipment used: None Transfers: Sit to/from Stand Sit to Stand: Modified independent (Device/Increase time)            Ambulation/Gait Ambulation/Gait assistance: Modified independent (Device/Increase time) Ambulation Distance (Feet): 250 Feet Assistive device: None (wall rail at times) Gait Pattern/deviations: Step-through pattern;Decreased stride length;Wide base of support Gait velocity: decr Gait velocity interpretation: Below normal speed for age/gender General Gait Details: Pt using wall rail on occasion.   Stairs            Wheelchair Mobility    Modified Rankin (Stroke Patients Only)       Balance Overall balance assessment: Needs assistance Sitting-balance support: Feet supported;No upper extremity supported Sitting  balance-Leahy Scale: Normal     Standing balance support: No upper extremity supported;During functional activity Standing balance-Leahy Scale: Good                               Pertinent Vitals/Pain Pain Assessment: 0-10 Pain Score: 4  Pain Location: ribs from coughing Pain Descriptors / Indicators: Sore Pain Intervention(s): Monitored during session    Home Living Family/patient expects to be discharged to:: Private residence Living Arrangements: Alone Available Help at Discharge: Family;Friend(s);Available PRN/intermittently Type of Home: Apartment Home Access: Level entry     Home Layout: One level Home Equipment: Crutches;Shower seat;Grab bars - toilet;Grab bars - tub/shower;Wheelchair - manual Additional Comments: Handicapped accessible apartment. Has diabetic service dog.    Prior Function Level of Independence: Independent with assistive device(s)         Comments: crutches vs w/c for community mobility. drives.      Hand Dominance        Extremity/Trunk Assessment   Upper Extremity Assessment Upper Extremity Assessment: Defer to OT evaluation    Lower Extremity Assessment Lower Extremity Assessment: Generalized weakness;RLE deficits/detail;LLE deficits/detail RLE Sensation: decreased proprioception LLE Sensation: decreased proprioception    Cervical / Trunk Assessment Cervical / Trunk Assessment: Other exceptions Cervical / Trunk Exceptions: hx of spinal sx, obesity  Communication   Communication: No difficulties  Cognition Arousal/Alertness: Awake/alert Behavior During Therapy: WFL for tasks assessed/performed Overall Cognitive Status: Within Functional Limits for tasks assessed  General Comments      Exercises     Assessment/Plan    PT Assessment Patent does not need any further PT services  PT Problem List            PT Treatment Interventions      PT Goals (Current goals can be found in  the Care Plan section)  Acute Rehab PT Goals Patient Stated Goal: return home PT Goal Formulation: All assessment and education complete, DC therapy    Frequency     Barriers to discharge        Co-evaluation               End of Session   Activity Tolerance: Patient tolerated treatment well Patient left: in bed;with call bell/phone within reach      Functional Assessment Tool Used: clinical judgement Functional Limitation: Mobility: Walking and moving around Mobility: Walking and Moving Around Current Status ([id number](G8978): At least 1 percent but less than 20 percent impaired, limited or restricted Mobility: Walking and Moving Around Goal Status [phone]([phone]): At least 1 percent but less than 20 percent impaired, limited or restricted Mobility: Walking and Moving Around Discharge Status [phone]): At least 1 percent but less than 20 percent impaired, limited or restricted    Time: [id number] PT Time Calculation (min) (ACUTE ONLY): 14 min   Charges:   PT Evaluation $PT Eval Low Complexity: 1 Procedure     PT G Codes:   PT G-Codes **NOT FOR INPATIENT CLASS** Functional Assessment Tool Used: clinical judgement Functional Limitation: Mobility: Walking and moving around Mobility: Walking and Moving Around Current Status ([id number](G8978): At least 1 percent but less than 20 percent impaired, limited or restricted Mobility: Walking and Moving Around Goal Status [phone]): At least 1 percent but less than 20 percent impaired, limited or restricted Mobility: Walking and Moving Around Discharge Status [phone]): At least 1 percent but less than 20 percent impaired, limited or restricted    Gavin Smith [facility] 07/23/2016, 4:31 PM [employer] CorporationCary Milynn Smith PT [phone]

## 2016-07-23 NOTE — Progress Notes (Signed)
Pt cbg was 478 and MD notified per order; MD ordered to give one time dose of 15units novolog along with 4units meal coverage and hold off sliding scale. MD notified of pt negative flu. Droplet precaution discontinued. Dionne BucyP. Amo Danasia Baker RN

## 2016-07-24 ENCOUNTER — Observation Stay (HOSPITAL_COMMUNITY): Payer: Medicare Other

## 2016-07-24 DIAGNOSIS — Z888 Allergy status to other drugs, medicaments and biological substances status: Secondary | ICD-10-CM | POA: Diagnosis not present

## 2016-07-24 DIAGNOSIS — R0602 Shortness of breath: Secondary | ICD-10-CM | POA: Diagnosis present

## 2016-07-24 DIAGNOSIS — R000 Tachycardia, unspecified: Secondary | ICD-10-CM | POA: Diagnosis present

## 2016-07-24 DIAGNOSIS — Z981 Arthrodesis status: Secondary | ICD-10-CM | POA: Diagnosis not present

## 2016-07-24 DIAGNOSIS — E7800 Pure hypercholesterolemia, unspecified: Secondary | ICD-10-CM | POA: Diagnosis present

## 2016-07-24 DIAGNOSIS — R1032 Left lower quadrant pain: Secondary | ICD-10-CM | POA: Diagnosis present

## 2016-07-24 DIAGNOSIS — Z9049 Acquired absence of other specified parts of digestive tract: Secondary | ICD-10-CM | POA: Diagnosis not present

## 2016-07-24 DIAGNOSIS — Z880 Allergy status to penicillin: Secondary | ICD-10-CM | POA: Diagnosis not present

## 2016-07-24 DIAGNOSIS — T486X5A Adverse effect of antiasthmatics, initial encounter: Secondary | ICD-10-CM | POA: Diagnosis present

## 2016-07-24 DIAGNOSIS — I10 Essential (primary) hypertension: Secondary | ICD-10-CM | POA: Diagnosis present

## 2016-07-24 DIAGNOSIS — E860 Dehydration: Secondary | ICD-10-CM | POA: Diagnosis present

## 2016-07-24 DIAGNOSIS — R1012 Left upper quadrant pain: Secondary | ICD-10-CM | POA: Diagnosis present

## 2016-07-24 DIAGNOSIS — Z933 Colostomy status: Secondary | ICD-10-CM | POA: Diagnosis not present

## 2016-07-24 DIAGNOSIS — Z794 Long term (current) use of insulin: Secondary | ICD-10-CM | POA: Diagnosis not present

## 2016-07-24 DIAGNOSIS — E1043 Type 1 diabetes mellitus with diabetic autonomic (poly)neuropathy: Secondary | ICD-10-CM | POA: Diagnosis present

## 2016-07-24 DIAGNOSIS — M159 Polyosteoarthritis, unspecified: Secondary | ICD-10-CM | POA: Diagnosis present

## 2016-07-24 DIAGNOSIS — E669 Obesity, unspecified: Secondary | ICD-10-CM | POA: Diagnosis present

## 2016-07-24 DIAGNOSIS — T380X5A Adverse effect of glucocorticoids and synthetic analogues, initial encounter: Secondary | ICD-10-CM | POA: Diagnosis present

## 2016-07-24 DIAGNOSIS — G4730 Sleep apnea, unspecified: Secondary | ICD-10-CM | POA: Diagnosis present

## 2016-07-24 DIAGNOSIS — K219 Gastro-esophageal reflux disease without esophagitis: Secondary | ICD-10-CM | POA: Diagnosis present

## 2016-07-24 DIAGNOSIS — Z8249 Family history of ischemic heart disease and other diseases of the circulatory system: Secondary | ICD-10-CM | POA: Diagnosis not present

## 2016-07-24 DIAGNOSIS — G43909 Migraine, unspecified, not intractable, without status migrainosus: Secondary | ICD-10-CM | POA: Diagnosis present

## 2016-07-24 DIAGNOSIS — J45901 Unspecified asthma with (acute) exacerbation: Secondary | ICD-10-CM | POA: Diagnosis present

## 2016-07-24 DIAGNOSIS — Z6841 Body Mass Index (BMI) 40.0 and over, adult: Secondary | ICD-10-CM | POA: Diagnosis not present

## 2016-07-24 DIAGNOSIS — Z885 Allergy status to narcotic agent status: Secondary | ICD-10-CM | POA: Diagnosis not present

## 2016-07-24 DIAGNOSIS — R935 Abnormal findings on diagnostic imaging of other abdominal regions, including retroperitoneum: Secondary | ICD-10-CM | POA: Diagnosis not present

## 2016-07-24 DIAGNOSIS — R112 Nausea with vomiting, unspecified: Secondary | ICD-10-CM | POA: Diagnosis present

## 2016-07-24 LAB — GLUCOSE, CAPILLARY
GLUCOSE-CAPILLARY: 215 mg/dL — AB (ref 65–99)
Glucose-Capillary: 168 mg/dL — ABNORMAL HIGH (ref 65–99)
Glucose-Capillary: 166 mg/dL — ABNORMAL HIGH (ref 65–99)
Glucose-Capillary: 296 mg/dL — ABNORMAL HIGH (ref 65–99)
Glucose-Capillary: 294 mg/dL — ABNORMAL HIGH (ref 65–99)
Glucose-Capillary: 325 mg/dL — ABNORMAL HIGH (ref 65–99)

## 2016-07-24 MED ORDER — BENZONATATE 100 MG PO CAPS
100.0000 mg | ORAL_CAPSULE | Freq: Two times a day (BID) | ORAL | Status: DC | PRN
  Administered 2016-07-24: 100 mg via ORAL
  Filled 2016-07-24: qty 1

## 2016-07-24 MED ORDER — SENNOSIDES-DOCUSATE SODIUM 8.6-50 MG PO TABS
1.0000 | ORAL_TABLET | Freq: Two times a day (BID) | ORAL | Status: DC
  Administered 2016-07-24 – 2016-07-25 (×3): 1 via ORAL
  Filled 2016-07-24 (×3): qty 1

## 2016-07-24 MED ORDER — SODIUM CHLORIDE 0.9 % IV BOLUS (SEPSIS)
1000.0000 mL | Freq: Once | INTRAVENOUS | Status: AC
  Administered 2016-07-24: 1000 mL via INTRAVENOUS

## 2016-07-24 NOTE — Progress Notes (Signed)
Medicine attending: Clinical status and labs reviewed with resident physician Dr. Rogelia Mirearoline Guilloud and I concur with her evaluation and management plan. Persistent, hacking, cough. Persistent tachycardia. Decreased colostomy output. Persistent intermittent vomiting. Unstable for discharge and this brittle type I diabetic.

## 2016-07-24 NOTE — Progress Notes (Signed)
Subjective: Patient has multiple complaints today. Reports she woke up at 3 am with a coughing attack. She also reports very minimal output in her ostomy bag since yesterday which is unusual for her. She is having left upper and lower quadrant abdominal pain. She reports a poor appetite but ate all three meals yesterday. She also says she feels dehydrated and gets dizzy when she stands up. Lastly, she reported today that she has been having episodes of emesis for the past two months. She last threw up yesterday evening before dinner.   Objective:  Vital signs in last 24 hours: Vitals:   07/23/16 1439 07/23/16 2004 07/23/16 2013 07/24/16 0329  BP:  112/71  (!) 144/96  Pulse:  88  (!) 103  Resp:  20  (!) 22  Temp:  98.2 F (36.8 C)  97.4 F (36.3 C)  TempSrc:  Oral  Oral  SpO2: 98% 98% 96% 99%  Weight:      Height:       Physical Exam Constitutional: Obese, NAD, appears comfortable HEENT: Atraumatic, normocephalic. PERRL, anicteric sclera.  Neck: Supple, trachea midline.  Cardiovascular: Tachycardic but regular rhythm, no murmurs, rubs, or gallops.  Pulmonary/Chest: CTAB, no wheezing, rales, or rhonchi.  Abdominal: Soft and mildly tender to palpation in the left upper and lower quadrants. Hypoactive bowel sounds. Ostomy bag in place. Extremities: Warm and well perfused. Distal pulses intact. No edema.  Neurological: A&Ox3, CN II - XII grossly intact.  Skin: No rashes or erythema  Psychiatric: Normal mood and affect  Assessment/Plan: Patient is a 40 yo F pmhx of HTN, type I DM, and asthma admitted for treatment of her asthma exacerbation now with continued coughing, emesis, abdominal pain, and minimal ostomy bag output since yesterday.   Asthma Exacerbation: She denies any wheezing and her lungs are clear on exam, but she continues to complain of severe coughing spells that woke her up throughout the night. Also complaining of dizziness when she stands and feels like she is  dehydrated.  -- Tessalon pearls BID prn  -- Prednisone 40 mg (day 2 / 5) -- 1L NS bolus  -- Orthostatic vitals  -- Continue Albuterol nebs q6 hours scheduled; and q4 hours prn -- Continue home pulmicort BID  GERD: -- Continue pepcid 20 mg BID  Type I DM: Takes 22 units of levemir in the morning and at night in addition to a sliding scale mealtime insulin at home. Appreciate DM coordinator recs. CBGs were elevated in the 300 - 400 range yesterday. Changed 30 units lantus ->  Levemir 20 units BID and added Novolog 4 units TID with meals in addition to SSI resistant TID & HS. Glucose improved today to 200 range. Likely exacerbated by steroid treatment for her asthma.  -- Continue Levemir 20 units BID -- Continue Novolog 4 units TID -- SSI resistant TID & HS -- CBG monitoring  Autonomic Neuropathy of the GI Tract: S/p partial colectomy in April of 2016. Ostomy bag in place. Follows with an outside gastroenterologist. Likely secondary to her type I diabetes however her records are not available in our system. No complaining of left upper and lower quadrant abdominal pain with minimal ostomy bag output since yesterday which is very abnormal for her. Hypoactive bowel sounds. Reports recurrent emesis x 2 months, last episode yesterday evening before dinner. -- KUB -- Continue to monitor ostomy bag output   Tachycardia:HR elevated to 140s in the ED. Patient reports she is chronically tachycardic and lives in the  110-120s range due to her autonomic dysfunction (?). Again, we do not have access to her outside records thus this diagnosis is unclear. TSH was checked and found to be normal.  -- Monitor for now -- F/u with PCP   HTN: Normotensive on admission, reports compliance with home meds.  -- Continue lisinopril 10 mg daily  HLD: -- Continue Lipitor 10 mg every other day   Chronic Back Pain: S/p spinal fusion L5-S1 now with chronic lower extremity weakness. Takes tramadol every night  for sleep and neuropathy per her neurologist.  -- Continue home tramadol and tizanidine 2 mg QHS  FEN: Encourage PO intake, 1L NS bolus, replete lytes prn, heart healthy diet  VTE ppx: Heparin  Code Status: FULL   Dispo: Anticipated discharge in approximately 1-2 day(s).   Reymundo Pollarolyn Eddye Broxterman, MD 07/24/2016, 8:03 AM Pager: [phone]

## 2016-07-25 DIAGNOSIS — Z880 Allergy status to penicillin: Secondary | ICD-10-CM

## 2016-07-25 DIAGNOSIS — Z933 Colostomy status: Secondary | ICD-10-CM

## 2016-07-25 DIAGNOSIS — Z888 Allergy status to other drugs, medicaments and biological substances status: Secondary | ICD-10-CM

## 2016-07-25 DIAGNOSIS — Z885 Allergy status to narcotic agent status: Secondary | ICD-10-CM

## 2016-07-25 DIAGNOSIS — J45901 Unspecified asthma with (acute) exacerbation: Principal | ICD-10-CM

## 2016-07-25 DIAGNOSIS — K219 Gastro-esophageal reflux disease without esophagitis: Secondary | ICD-10-CM

## 2016-07-25 LAB — GLUCOSE, CAPILLARY
GLUCOSE-CAPILLARY: 176 mg/dL — AB (ref 65–99)
Glucose-Capillary: 250 mg/dL — ABNORMAL HIGH (ref 65–99)

## 2016-07-25 MED ORDER — DIPHENHYDRAMINE HCL 50 MG/ML IJ SOLN
25.0000 mg | Freq: Once | INTRAMUSCULAR | Status: AC
  Administered 2016-07-25: 25 mg via INTRAVENOUS
  Filled 2016-07-25: qty 1

## 2016-07-25 MED ORDER — SENNOSIDES-DOCUSATE SODIUM 8.6-50 MG PO TABS: 1.0000 | ORAL_TABLET | Freq: Two times a day (BID) | ORAL | 1 refills | Status: DC

## 2016-07-25 MED ORDER — FAMOTIDINE 20 MG PO TABS: 20.0000 mg | ORAL_TABLET | Freq: Two times a day (BID) | ORAL | 2 refills | Status: DC

## 2016-07-25 MED ORDER — PROCHLORPERAZINE EDISYLATE 5 MG/ML IJ SOLN
10.0000 mg | Freq: Once | INTRAMUSCULAR | Status: AC
  Administered 2016-07-25: 10 mg via INTRAVENOUS
  Filled 2016-07-25: qty 2

## 2016-07-25 MED ORDER — BENZONATATE 100 MG PO CAPS: 100.0000 mg | ORAL_CAPSULE | Freq: Two times a day (BID) | ORAL | 0 refills | Status: DC | PRN

## 2016-07-25 MED ORDER — PREDNISONE 20 MG PO TABS: 40.0000 mg | ORAL_TABLET | Freq: Every day | ORAL | 0 refills | Status: AC

## 2016-07-25 MED ORDER — KETOROLAC TROMETHAMINE 30 MG/ML IJ SOLN
30.0000 mg | Freq: Once | INTRAMUSCULAR | Status: AC
  Administered 2016-07-25: 30 mg via INTRAVENOUS
  Filled 2016-07-25: qty 1

## 2016-07-25 NOTE — Progress Notes (Signed)
Nursing staff went into room to administer AM med Meds were handed to pt, pt became unresponsive. Pt was alert, but unable to communicate verbally or make any movements. Rapid response nurse was called to assess situation. On call MD was notified. VS 138/88, HR 98 O2 100 on RA. Will continue to monitor.

## 2016-07-25 NOTE — Progress Notes (Signed)
Spoke with Cathlean CowerAlesia CM - [facility]. Pt given contact information and encouraged to call on Tuesday if they did not call her. Cathlean CowerAlesia asked RN to teach pt to irrigate colostomy - spoke with charge nurse on 6N regarding recommendations. [place] in [place], Wellsite geologist[facility] staff unfamiliar. Pt states she is comfortable waiting for colostomy to be irrigated until home health sees her. Verbalized understanding of needing to call.   Leonidas Rombergaitlin S Bumbledare, RN

## 2016-07-25 NOTE — Progress Notes (Signed)
Internal Medicine Attending:   I saw and examined the patient. I reviewed the resident's note and I agree with the resident's findings and plan as documented in the resident's note.  Patient complained of a severe headache this morning about responses called as patient refused to speak to the RN secondary to her headache. Patient states headache resolved with a migraine cocktail of Benadryl, Toradol and Compazine. She feels well now and would like to go home.  Patient was initially admitted with an asthma exacerbation which is now resolved. We will continue with prednisone 40 mg to complete a 5 day course. Her lungs are clear on exam and she reports improvement in her breathing. Continue with home Pulmicort as well as albuterol as needed.  Patient also noted to have elevated blood sugars. This is likely secondary to her steroid use. Patient is stable for discharge home today on home doses of her medications.

## 2016-07-25 NOTE — Progress Notes (Signed)
Order received to discharge patient.  Patient expresses readiness to discharge.  Discharge instructions, follow up, medications and instructions for their use were discussed and patient expresses understanding.  Telemetry monitor removed and CCMD notified.  PIV access removed without difficulty.

## 2016-07-25 NOTE — Progress Notes (Signed)
IMTS Cross Cover Progress Note  Ms. Gavin Smith is a 40yo male admitted for asthma exacerbation. Per nursing, when they were attempting to give AM meds, patient was unable to answer and would not move from fetal position though was awake and aware. She was clutching her head. Rapid response was called at which time patient was more interactive and was able to indicate that she was having a headache. Patient evaluated at bedside. She indicates wanting to communicate but being unable to. She shrugs unknowingly as to when headache began or whether she slept. She indicated headache is right sided, pulsating, and has photophobia. She wrote down that her headache is worse with coughing; she had apparently had 2 concussions within 24hrs in college where she was unable to speak for some time as well. She wrote down that she can't speak because her words are getting confused. Otherwise she denies prior episodes of similar pain and symptoms. She endorses mild nausea, but no numbness, tingling, weakness.   Physical Exam: Constitutional: mild distress; patient clutching right side of head most of exam; VS reviewed and unremarkable CV: RRR, no murmurs, rubs or gallops appreciated Resp: no increased work of breathing, CTAB, no wheezing or crackles appreciated Neuro: CN 2-12 intact, sensation and strength intact throughout; indicates photophobia on pupillary exam. When indicating wanting to speak patient does not physically move her mouth; able to open mouth and move tongue when prompted and was able to make "ahh" sound. Uvula midline with appropriate rise of soft palate ENT: mucous membranes pink and moist. Oropharynx without erythema or exudates.   Assessment & Plan: Signs and symptoms consistent with migraine headache. Her inability to speak is not consistent with exam. --migraine cocktail --consider imaging if no improvement  Gavin MarketGorica Garvin Ellena, MD IMTS - PGY1 Pager [phone]

## 2016-07-25 NOTE — Significant Event (Signed)
Rapid Response Event Note RN called for new onset pt unable to speak Overview: Time Called: 0625 Arrival Time: 0626 Event Type: Neurologic  Initial Focused Assessment: On arrival pt alert, not answering questions appropriately, pt holding her head was able to write on paper "bad headache." RN reports symptoms started after coughing spell. When asked on a scale of 1 to 10 what would she rate her pain, pt nodded her head "yes", I said on a scale of 1 to 10, pt quickly pointed at me and shook her head "yes" to the number 10. I asked her to confirm her headache a 10/10, pt again nodded her head "yes."   BP 138/88, HR 93, RR 17, 100% RA, 98.3 oral temp   Interventions: Resident team to bedside to further assess pt. No new orders at this time  Plan of Care (if not transferred): [facility] to continue to monitor pt, call MD with any new or ongoing concerns, call RRT if needed Event Summary: Name of Physician Notified: Teaching Services at [phone]    at    Outcome: Stayed in room and stabalized     Gavin Smith, Gavin Smith

## 2016-07-25 NOTE — Progress Notes (Addendum)
[facility] Unit RN will demostrate to pt prior to dc how to irrigate colostomy.  Isidoro DonningAlesia Nikaya Nasby RN CCM Case Mgmt phone [phone]

## 2016-07-25 NOTE — Progress Notes (Signed)
Patient ambulated 800 feet in hallway with no apparent distress.

## 2016-07-25 NOTE — Discharge Summary (Signed)
Name: Gavin Smith MRN: [id number] DOB: October 29, 1975 40 y.o. PCP: Estill Bamberg. Megan Salon, MD  Date of Admission: 07/22/2016  8:57 AM Date of Discharge: 07/25/2016 Attending Physician: Annia Belt, MD  Discharge Diagnosis: 1. Asthma Exacerbation  2. Hx of colostomy  3. GERD  Discharge Medications: Allergies as of 07/25/2016      Reactions   Fenofibrate Other (See Comments)   Severe leg pain   Humalog [insulin Lispro] Other (See Comments)   Drug tolerance >> ineffectiveness! Pt needs NovoLog instead!   Morphine And Related    Penicillins       Medication List    STOP taking these medications   sulfamethoxazole-trimethoprim 800-160 MG per tablet Commonly known as:  BACTRIM DS     TAKE these medications   ACCU-CHEK AVIVA CONNECT w/Device Kit 1 each by Does not apply route daily. Dx: E11.42   ACCU-CHEK AVIVA PLUS test strip Generic drug:  glucose blood USE TO CHECK BLOOD SUGAR FIVE TIMES A DAY AS DIRECTED   ACCU-CHEK FASTCLIX LANCETS Misc USE TO CHECK BLOOD SUGAR THREE TIMES A DAY   albuterol (2.5 MG/3ML) 0.083% nebulizer solution Commonly known as:  PROVENTIL Take 2.5 mg by nebulization every 6 (six) hours as needed for wheezing.   PROAIR HFA 108 (90 Base) MCG/ACT inhaler Generic drug:  albuterol Inhale 1 puff into the lungs every 4 (four) hours as needed for wheezing.   amitriptyline 25 MG tablet Commonly known as:  ELAVIL Take 25 mg by mouth.   atorvastatin 10 MG tablet Commonly known as:  LIPITOR Take 10 mg by mouth daily.   B-D ULTRAFINE III SHORT PEN 31G X 8 MM Misc Generic drug:  Insulin Pen Needle   B-D ULTRAFINE III SHORT PEN 31G X 8 MM Misc Generic drug:  Insulin Pen Needle use for 5 INJECTIONS daily   benzonatate 100 MG capsule Commonly known as:  TESSALON Take 1 capsule (100 mg total) by mouth 2 (two) times daily as needed for cough.   CALCIUM PO Take 1 tablet by mouth daily.   CYMBALTA 30 MG capsule Generic drug:  DULoxetine Take  30 mg by mouth.   famotidine 20 MG tablet Commonly known as:  PEPCID Take 1 tablet (20 mg total) by mouth 2 (two) times daily.   insulin aspart 100 UNIT/ML FlexPen Commonly known as:  NOVOLOG FLEXPEN Inject 50 units into the skin QID What changed:  how much to take  how to take this  when to take this  additional instructions   Insulin Syringe-Needle U-100 31G X 5/16" 0.3 ML Misc Use to inject insulin 3 times daily as directed.   LEVEMIR FLEXTOUCH 100 UNIT/ML Pen Generic drug:  Insulin Detemir inject 42 units subcutaneously every morning What changed:  See the new instructions.   lisinopril 10 MG tablet Commonly known as:  PRINIVIL,ZESTRIL Take 10 mg by mouth daily.   loratadine 10 MG tablet Commonly known as:  CLARITIN Take 10 mg by mouth daily.   MULTI-DAY PO Take 1 tablet by mouth daily.   predniSONE 20 MG tablet Commonly known as:  DELTASONE Take 2 tablets (40 mg total) by mouth daily with breakfast. Last day Jan 2nd. Start taking on:  07/26/2016 Notes to patient:  Take as directed   PULMICORT 0.5 MG/2ML nebulizer solution Generic drug:  budesonide Take 2 mLs by nebulization daily as needed for wheezing.   senna-docusate 8.6-50 MG tablet Commonly known as:  Senokot-S Take 1 tablet by mouth 2 (two) times daily.  tiZANidine 2 MG tablet Commonly known as:  ZANAFLEX Take 2 mg by mouth every 8 (eight) hours as needed for muscle spasms.   traMADol 50 MG tablet Commonly known as:  ULTRAM Take 100 mg by mouth at bedtime.   vitamin C 500 MG tablet Commonly known as:  ASCORBIC ACID Take 500 mg by mouth daily.   Vitamin D (Ergocalciferol) 50000 units Caps capsule Commonly known as:  DRISDOL Take 1 capsule (50,000 Units total) by mouth daily.       Disposition and follow-up:   Gavin Smith was discharged from [facility] in Stable condition.  At the hospital follow up visit please address:  1.  Asthma Exacerbation: Discharged with a  prescription to complete a 5 day course of prednisone. Also with Ladona Ridgel for her cough. Continue home Pulmicort and albuterol.   2. GERD: Started on Pepcid 20 mg BID this admission. Encouraged compliance in the setting of her asthma. Please follow up symptoms.   3. Hx of Colostomy:  Patient reported intermittent emesis and abdominal pain around ostomy site. KUB this admission negative for obstruction or problem with colostomy bag - only moderate stool burden. Discharged on Senna-docusate BID. Instructed to follow up with her GI doctor. Orders placed for home health aid to assist with management of her colostomy bag.   4. Type I DM: Blood glucose difficult to controlled this admission due to steroid treatment of her asthma. Discharged on home regimen. Please follow up.   5.  Labs / imaging needed at time of follow-up: CBG   6.  Pending labs/ test needing follow-up: None  Follow-up Appointments: Follow-up Information    CAMPBELL, STEPHEN D., MD. Call in 2 week(s).   Specialty:  Internal Medicine Why:  Please call your primary care doctor and schedule a hospital follow up appointment in 1-2 weeks.  Contact information: [address] [place] [id number] [place] by problem list:  1. Asthma Exacerbation: Patient presented with SOB and wheezing x 9 days. Patient reported a history of asthma since the age of 4. 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  She reported getting asthma exacerbations requiring hospitalization about once a year, but since moving here hasn't had one in almost 3 years. Cold weather is a trigger for her asthma and over the past few days her wheezing SOB had progressively worsened with the weather change. She reported using her home Pulmicort daily and rescue inhaler every 4 hours without relief. She went to an urgent care and was prescribed a course of Levaquin which she  finished without relief. The morning of admission, patient says she woke up on the floor in her home. She said she felt a bit dizzy and confused. She did not remember falling, but denied any trauma. She says she did remember having a coughing fit prior to the episode. In the ED, patient was tachycardic with HR 145, RR 24, BP 134/98, Oxygen 100% on RA and T 98 F. Labs were significant for a leukocytosis of 13.4. CBC and CMP were otherwise unremarkable. I-stat troponin was 0.00.  UPT was negative. CXR was normal. She received received nebulized albuterol, ipratropium, and IV solumedrol 125 mg x 1 in the ED. She continued to have cough and wheezing and was admitted for further treatment of her asthma. Her cough was treated with tessalon perles  BID. She received nebulized albuterol q6 hours scheduled and q4 hours prn. She was also started on a 5 day course of prednisone. Home Pulmicort was continued. Symptoms improved the following day. Wheezing and SOB resolved, but she continued to have a mild persistent cough.  She was discharged with a prescription for Tessalon Perles and prednisone to complete her course. Home albuterol and Pulmicort were also continued on discharge. Patient tested negative for influenza PCR.   2. Autonomic Neuropathy of the GI Tract: S/p partial colectomy in April of 2016 now with permanent ostomy bag in place. On day 2 of hospitalization patient reported abdominal pain and one episode of emesis. On further questioning, patient reported intermittent vomiting for the past 2 months. In addition, she felt that her ostomy bag output had decreased since admission. Physical exam was reassuring and KUB was obtained. Plain film showed no complication involving the colostomy and no evidence of obstruction - only moderate stool burden. Patient had been receiving colace which was switched to senna-docusate. Discharged with instruction to follow up with her established GI doctor for further evaluation of  her colostomy bag. Orders were placed for a home health aid to assist with colostomy bag care.   3. Tachycardia: HR elevated in the 140s on admission. Patient reported a long history of chronic tachycardia due to her autonomic dysfunction, with baseline rates in the 110s - 120s. I suspect acute on chronic tachycardia likely due to continuous albuterol nebs and possibly dehydration. Orthostatics were negative by blood pressure parameters, but HR did elevate > 20 bmp from lying to standing with associated dizziness. She was given 1L NS bolus and symptoms improved. Rates normalized over the next couple of days, back to normal range (70s) on day of discharge.   4. GERD: Patient reported new symptoms of acid reflux over the past few weeks for which she had been taking Tums at home. Pepcid 20 mg BID was started on admission with improvement in symptoms. Counseled patient on the importance reflux symptom managmenet in order to help prevent future asthma exacerbations. Encouraged compliance with Pepcid.   5. Type I DM: Diagnosed at age 43, complicated by autonomic neuropathy of the GI tract now s/p partial colectomy with permanent ostomy. CBGs were difficult to control this admission likely due to steroid treatment for her asthma. She was managed on levemir 20 BID, novolog 4 units TID with meals, and SSI resistant TID with meals & HS. Glucose range 150 - 300 on discharge. Patient was discharged on her home regimen of 22 units Levemir BID and customized SSI for mealtime. A1C this admission 7.8. Follow up with PCP.  Discharge Vitals:   BP 138/88 (BP Location: Right Arm)   Pulse 93   Temp 98.3 F (36.8 C) (Oral)   Resp 17   Ht _0  (1.727 m)   Wt (!) 314 lb 14.4 oz (142.8 kg)   SpO2 100%   BMI 47.88 kg/m   Pertinent Labs, Studies, and Procedures:   07/22/2016 Chest Port 1 View: IMPRESSION: 1. Low lung volumes without radiographic evidence of acute cardiopulmonary disease.  07/24/2016 Abdominal  Portable 1V: IMPRESSION: No complication involving LEFT colostomy appreciated by plain film radiography. Moderate volume stool.  Labs:   Ref. Range 07/22/2016 09:55  Hemoglobin A1C Latest Ref Range: 4.8 - 5.6 % 7.8 (H)    Ref. Range 07/23/2016 11:04  Influenza B By PCR Latest Ref Range: NEGATIVE  NEGATIVE    Discharge Instructions: Discharge Instructions    Call MD  for:  difficulty breathing, headache or visual disturbances    Complete by:  As directed    Call MD for:  persistant dizziness or light-headedness    Complete by:  As directed    Call MD for:  persistant nausea and vomiting    Complete by:  As directed    Call MD for:  temperature >100.4    Complete by:  As directed    Diet - low sodium heart healthy    Complete by:  As directed    Discharge instructions    Complete by:  As directed    For you asthma, please continue to take 40 mg of prednisone (2 tablets) daily for the next 3 days (last day Jan 2nd). For your cough, you may continue to take Tessalon Perles twice a day as needed. You have also been started on Pepcid twice a day for your acid reflux - this may help reduce asthma exacerbations as well. Please call your primary care doctor and schedule a hospital follow up appointment in 1-2 weeks. You will also need to call your GI doctor to have your ostomy bag looked at. I have changed your colace to Senna-docusate to help with your constipation and low ostomy bag output. Again, please follow up with GI for this issue. New prescriptions for all of these medications have been sent to your pharmacy. You may continue taking all of your other medications as previously prescribed. It was a pleasure taking care of you, I am glad you are feeling better. Thank you!   Increase activity slowly    Complete by:  As directed       Signed: Velna Ochs, MD 07/25/2016, 12:50 PM   Pager: [phone]

## 2016-07-25 NOTE — Progress Notes (Signed)
Call made to Care Management regarding patient's Home Health request for discharge.  Message left.

## 2016-07-25 NOTE — Care Management Note (Signed)
Case Management Note  Patient Details  Name: Gaspar SkeetersLisa M Gunderman MRN: [id number] Date of Birth: 08/19/1975  Subjective/Objective:    Asthma exacerbation, Colostomy                Action/Plan: Discharge Planning: AVS reviewed:  [facility] to pt and [facility]. [facility] with new referral. Faxed facesheet, orders and dc summary fax [phone].    PCP CAMPBELL, Mila HomerSTEPHEN D. MD   Expected Discharge Date:                  Expected Discharge Plan:  Home w Home Health Services  In-House Referral:  NA  Discharge planning Services  CM Consult  Post Acute Care Choice:  Home Health Choice offered to:  Patient  DME Arranged:  N/A DME Agency:  NA  HH Arranged:  RN HH Agency[facility]  [facility]  Status of Service:  Completed, signed off  If discussed at Long Length of Stay Meetings, dates discussed:    Additional Comments:  Elliot CousinShavis, Sadiya Durand Ellen, RN 07/25/2016, 1:59 PM

## 2016-07-25 NOTE — Progress Notes (Signed)
MD on call at bedside to evaluate pt further. MD will order migraine cocktail. Will continue to monitor.

## 2016-07-25 NOTE — Progress Notes (Signed)
Subjective: Rapid response was called this morning when RN went to give patient her morning medications. Found patient lying in the fetal position clutching her head, refusing to speak. Rapid response team was able to engage with the patient at which point she indicated that she was having migraine. She was given a migraine cocktail (benadryl, Toradol, compazine) with significant improvement in symptoms. Patient was sleeping comfortable on rounds this morning after the cocktail. She was speaking in full sentences and moving all extremities spontaneously. Reported only a mild residual headache. Says she feels ready to go home. Continues to have a cough but improved from yesterday. No longer dizzy when standing.   Objective:  Vital signs in last 24 hours: Vitals:   07/24/16 2211 07/25/16 0438 07/25/16 0644 07/25/16 0645  BP:  136/74 138/88   Pulse:  78 (!) 130 93  Resp:  18 17   Temp:  98 F (36.7 C) 98.3 F (36.8 C)   TempSrc:  Oral Oral   SpO2: 99% 99% 100%   Weight:      Height:       Physical Exam Constitutional: Obese, NAD, appears comfortable. HEENT: Atraumatic, normocephalic. PERRL, anicteric sclera.  Neck: Supple, trachea midline.  Cardiovascular:RRR, no murmurs, rubs, or gallops.  Pulmonary/Chest:CTAB, no wheezing, rales, or rhonchi.  Abdominal:Soft, non tender, non distended. Ostomy bag in place. Extremities: Warm and well perfused. Distal pulses intact. No edema.  Neurological:A&Ox3, CN II - XII grossly intact.  Skin: No rashes or erythema  Psychiatric:Normal mood and affect  Assessment/Plan:  Asthma Exacerbation: Lungs are clear on exam and cough continues to improve. Breathing comfortably on room air. Dizziness improved with 1L NS bolus yesterday. Patient feels ready for discharge.  -- Tessalon pearls BID prn  -- Prednisone 40 mg (day 3 / 5) -- Continue Albuterol nebs q6 hours scheduled; and q4 hours prn -- Continue home pulmicort BID  GERD: Patient reported  new symptoms of acid reflux for which she has been taking Tums at home. Started Pepcid on admission with improvement in symptoms.  -- Continue pepcid 20 mg BID  Type I DM: Takes 22 units of levemir in the morning and at night in addition to a sliding scale mealtime insulin at home. Appreciate DM coordinator recs. CBGs have been elevated this admission, likely exacerbated by steroid treatment for her asthma.  -- Continue Levemir 20 units BID -- Continue Novolog 4 units TID -- SSI resistant TID & HS -- CBG monitoring  Autonomic Neuropathy of the GI Tract: S/p partial colectomy in April of 2016. Ostomy bag in place. Follows with an outside gastroenterologist. Likely secondary to her type I diabetes however her records are not available in our system. Reports recurrent emesis x 2 months, but no emesis for that 2 days. KUB yesterday was reassuring, only moderate stool burden. Colace was switched to senna-docusate.  -- F/u with outpatient GI   Tachycardia:HR elevated to 140s in the ED. Patient reports she is chronically tachycardic and lives in the 110-120s range due to her autonomic dysfunction (?). Again, we do not have access to her outside records thus this diagnosis is unclear. TSH was checked and found to be normal. Rates are currently well controlled (70s while sleeping on rounds this morning).   -- Monitor for now -- F/u with PCP   HTN: Normotensive on admission, reports compliance with home meds.  -- Continue lisinopril 10 mg daily  HLD: -- Continue Lipitor 10 mg every other day   Chronic Back Pain:  S/p spinal fusion L5-S1 now with chronic lower extremity weakness. Takes tramadol every night for sleep and neuropathy per her neurologist.  -- Continue home tramadol and tizanidine 2 mg QHS  FEN: Encourage PO intake, replete lytes prn, heart healthy diet  VTE ppx: Heparin  Code Status: FULL   Dispo: Anticipated discharge today.   Gavin Pollarolyn [name], MD 07/25/2016, 8:56  AM Pager: [phone]

## 2016-07-28 DIAGNOSIS — Z433 Encounter for attention to colostomy: Secondary | ICD-10-CM | POA: Diagnosis not present

## 2016-07-28 DIAGNOSIS — Z7952 Long term (current) use of systemic steroids: Secondary | ICD-10-CM | POA: Diagnosis not present

## 2016-07-28 DIAGNOSIS — Z602 Problems related to living alone: Secondary | ICD-10-CM | POA: Diagnosis not present

## 2016-07-28 DIAGNOSIS — M6281 Muscle weakness (generalized): Secondary | ICD-10-CM | POA: Diagnosis not present

## 2016-07-28 DIAGNOSIS — K219 Gastro-esophageal reflux disease without esophagitis: Secondary | ICD-10-CM | POA: Diagnosis not present

## 2016-07-28 DIAGNOSIS — E119 Type 2 diabetes mellitus without complications: Secondary | ICD-10-CM | POA: Diagnosis not present

## 2016-07-28 DIAGNOSIS — Z794 Long term (current) use of insulin: Secondary | ICD-10-CM | POA: Diagnosis not present

## 2016-07-28 DIAGNOSIS — J45901 Unspecified asthma with (acute) exacerbation: Secondary | ICD-10-CM | POA: Diagnosis not present

## 2016-08-02 DIAGNOSIS — J45901 Unspecified asthma with (acute) exacerbation: Secondary | ICD-10-CM | POA: Diagnosis not present

## 2016-08-02 DIAGNOSIS — K219 Gastro-esophageal reflux disease without esophagitis: Secondary | ICD-10-CM | POA: Diagnosis not present

## 2016-08-02 DIAGNOSIS — E119 Type 2 diabetes mellitus without complications: Secondary | ICD-10-CM | POA: Diagnosis not present

## 2016-08-02 DIAGNOSIS — Z602 Problems related to living alone: Secondary | ICD-10-CM | POA: Diagnosis not present

## 2016-08-02 DIAGNOSIS — M6281 Muscle weakness (generalized): Secondary | ICD-10-CM | POA: Diagnosis not present

## 2016-08-02 DIAGNOSIS — Z7952 Long term (current) use of systemic steroids: Secondary | ICD-10-CM | POA: Diagnosis not present

## 2016-08-02 DIAGNOSIS — Z794 Long term (current) use of insulin: Secondary | ICD-10-CM | POA: Diagnosis not present

## 2016-08-02 DIAGNOSIS — Z433 Encounter for attention to colostomy: Secondary | ICD-10-CM | POA: Diagnosis not present

## 2016-08-03 DIAGNOSIS — K219 Gastro-esophageal reflux disease without esophagitis: Secondary | ICD-10-CM | POA: Diagnosis not present

## 2016-08-03 DIAGNOSIS — Z794 Long term (current) use of insulin: Secondary | ICD-10-CM | POA: Diagnosis not present

## 2016-08-03 DIAGNOSIS — M6281 Muscle weakness (generalized): Secondary | ICD-10-CM | POA: Diagnosis not present

## 2016-08-03 DIAGNOSIS — Z7952 Long term (current) use of systemic steroids: Secondary | ICD-10-CM | POA: Diagnosis not present

## 2016-08-03 DIAGNOSIS — Z433 Encounter for attention to colostomy: Secondary | ICD-10-CM | POA: Diagnosis not present

## 2016-08-03 DIAGNOSIS — J45901 Unspecified asthma with (acute) exacerbation: Secondary | ICD-10-CM | POA: Diagnosis not present

## 2016-08-03 DIAGNOSIS — Z602 Problems related to living alone: Secondary | ICD-10-CM | POA: Diagnosis not present

## 2016-08-03 DIAGNOSIS — E119 Type 2 diabetes mellitus without complications: Secondary | ICD-10-CM | POA: Diagnosis not present

## 2016-08-05 DIAGNOSIS — E119 Type 2 diabetes mellitus without complications: Secondary | ICD-10-CM | POA: Diagnosis not present

## 2016-08-05 DIAGNOSIS — Z602 Problems related to living alone: Secondary | ICD-10-CM | POA: Diagnosis not present

## 2016-08-05 DIAGNOSIS — J45901 Unspecified asthma with (acute) exacerbation: Secondary | ICD-10-CM | POA: Diagnosis not present

## 2016-08-05 DIAGNOSIS — Z7952 Long term (current) use of systemic steroids: Secondary | ICD-10-CM | POA: Diagnosis not present

## 2016-08-05 DIAGNOSIS — M6281 Muscle weakness (generalized): Secondary | ICD-10-CM | POA: Diagnosis not present

## 2016-08-05 DIAGNOSIS — Z433 Encounter for attention to colostomy: Secondary | ICD-10-CM | POA: Diagnosis not present

## 2016-08-05 DIAGNOSIS — Z794 Long term (current) use of insulin: Secondary | ICD-10-CM | POA: Diagnosis not present

## 2016-08-05 DIAGNOSIS — K219 Gastro-esophageal reflux disease without esophagitis: Secondary | ICD-10-CM | POA: Diagnosis not present

## 2016-08-06 DIAGNOSIS — Z7952 Long term (current) use of systemic steroids: Secondary | ICD-10-CM | POA: Diagnosis not present

## 2016-08-06 DIAGNOSIS — K219 Gastro-esophageal reflux disease without esophagitis: Secondary | ICD-10-CM | POA: Diagnosis not present

## 2016-08-06 DIAGNOSIS — Z794 Long term (current) use of insulin: Secondary | ICD-10-CM | POA: Diagnosis not present

## 2016-08-06 DIAGNOSIS — J45901 Unspecified asthma with (acute) exacerbation: Secondary | ICD-10-CM | POA: Diagnosis not present

## 2016-08-06 DIAGNOSIS — E119 Type 2 diabetes mellitus without complications: Secondary | ICD-10-CM | POA: Diagnosis not present

## 2016-08-06 DIAGNOSIS — Z433 Encounter for attention to colostomy: Secondary | ICD-10-CM | POA: Diagnosis not present

## 2016-08-06 DIAGNOSIS — M6281 Muscle weakness (generalized): Secondary | ICD-10-CM | POA: Diagnosis not present

## 2016-08-06 DIAGNOSIS — Z602 Problems related to living alone: Secondary | ICD-10-CM | POA: Diagnosis not present

## 2016-08-10 DIAGNOSIS — J45909 Unspecified asthma, uncomplicated: Secondary | ICD-10-CM | POA: Diagnosis not present

## 2016-08-10 DIAGNOSIS — K219 Gastro-esophageal reflux disease without esophagitis: Secondary | ICD-10-CM | POA: Diagnosis not present

## 2016-08-10 DIAGNOSIS — Z602 Problems related to living alone: Secondary | ICD-10-CM | POA: Diagnosis not present

## 2016-08-10 DIAGNOSIS — J45901 Unspecified asthma with (acute) exacerbation: Secondary | ICD-10-CM | POA: Diagnosis not present

## 2016-08-10 DIAGNOSIS — Z433 Encounter for attention to colostomy: Secondary | ICD-10-CM | POA: Diagnosis not present

## 2016-08-10 DIAGNOSIS — Z7952 Long term (current) use of systemic steroids: Secondary | ICD-10-CM | POA: Diagnosis not present

## 2016-08-10 DIAGNOSIS — E119 Type 2 diabetes mellitus without complications: Secondary | ICD-10-CM | POA: Diagnosis not present

## 2016-08-10 DIAGNOSIS — M6281 Muscle weakness (generalized): Secondary | ICD-10-CM | POA: Diagnosis not present

## 2016-08-10 DIAGNOSIS — Z794 Long term (current) use of insulin: Secondary | ICD-10-CM | POA: Diagnosis not present

## 2016-08-16 DIAGNOSIS — Z7952 Long term (current) use of systemic steroids: Secondary | ICD-10-CM | POA: Diagnosis not present

## 2016-08-16 DIAGNOSIS — Z794 Long term (current) use of insulin: Secondary | ICD-10-CM | POA: Diagnosis not present

## 2016-08-16 DIAGNOSIS — Z433 Encounter for attention to colostomy: Secondary | ICD-10-CM | POA: Diagnosis not present

## 2016-08-16 DIAGNOSIS — K219 Gastro-esophageal reflux disease without esophagitis: Secondary | ICD-10-CM | POA: Diagnosis not present

## 2016-08-16 DIAGNOSIS — J45901 Unspecified asthma with (acute) exacerbation: Secondary | ICD-10-CM | POA: Diagnosis not present

## 2016-08-16 DIAGNOSIS — E119 Type 2 diabetes mellitus without complications: Secondary | ICD-10-CM | POA: Diagnosis not present

## 2016-08-16 DIAGNOSIS — Z602 Problems related to living alone: Secondary | ICD-10-CM | POA: Diagnosis not present

## 2016-08-16 DIAGNOSIS — M6281 Muscle weakness (generalized): Secondary | ICD-10-CM | POA: Diagnosis not present

## 2016-08-18 DIAGNOSIS — E1142 Type 2 diabetes mellitus with diabetic polyneuropathy: Secondary | ICD-10-CM | POA: Diagnosis not present

## 2016-08-18 DIAGNOSIS — R51 Headache: Secondary | ICD-10-CM | POA: Diagnosis not present

## 2016-08-26 DIAGNOSIS — R51 Headache: Secondary | ICD-10-CM | POA: Diagnosis not present

## 2016-08-26 DIAGNOSIS — E1142 Type 2 diabetes mellitus with diabetic polyneuropathy: Secondary | ICD-10-CM | POA: Diagnosis not present

## 2016-09-03 DIAGNOSIS — R51 Headache: Secondary | ICD-10-CM | POA: Diagnosis not present

## 2016-09-03 DIAGNOSIS — Z433 Encounter for attention to colostomy: Secondary | ICD-10-CM | POA: Diagnosis not present

## 2016-09-03 DIAGNOSIS — K219 Gastro-esophageal reflux disease without esophagitis: Secondary | ICD-10-CM | POA: Diagnosis not present

## 2016-09-03 DIAGNOSIS — Z794 Long term (current) use of insulin: Secondary | ICD-10-CM | POA: Diagnosis not present

## 2016-09-03 DIAGNOSIS — E1142 Type 2 diabetes mellitus with diabetic polyneuropathy: Secondary | ICD-10-CM | POA: Diagnosis not present

## 2016-09-03 DIAGNOSIS — M6281 Muscle weakness (generalized): Secondary | ICD-10-CM | POA: Diagnosis not present

## 2016-09-03 DIAGNOSIS — E119 Type 2 diabetes mellitus without complications: Secondary | ICD-10-CM | POA: Diagnosis not present

## 2016-09-03 DIAGNOSIS — Z7952 Long term (current) use of systemic steroids: Secondary | ICD-10-CM | POA: Diagnosis not present

## 2016-09-03 DIAGNOSIS — Z602 Problems related to living alone: Secondary | ICD-10-CM | POA: Diagnosis not present

## 2016-09-03 DIAGNOSIS — M7660 Achilles tendinitis, unspecified leg: Secondary | ICD-10-CM | POA: Diagnosis not present

## 2016-09-03 DIAGNOSIS — J45901 Unspecified asthma with (acute) exacerbation: Secondary | ICD-10-CM | POA: Diagnosis not present

## 2016-09-10 DIAGNOSIS — J45909 Unspecified asthma, uncomplicated: Secondary | ICD-10-CM | POA: Diagnosis not present

## 2016-09-14 ENCOUNTER — Other Ambulatory Visit: Payer: Self-pay | Admitting: Internal Medicine

## 2016-09-15 ENCOUNTER — Other Ambulatory Visit: Payer: Self-pay | Admitting: Internal Medicine

## 2016-09-15 ENCOUNTER — Other Ambulatory Visit: Payer: Self-pay

## 2016-09-15 ENCOUNTER — Telehealth: Payer: Self-pay | Admitting: Internal Medicine

## 2016-09-15 MED ORDER — VITAMIN D (ERGOCALCIFEROL) 1.25 MG (50000 UNIT) PO CAPS: 50000.0000 [IU] | ORAL_CAPSULE | Freq: Every day | ORAL | 1 refills | Status: DC

## 2016-09-15 NOTE — Telephone Encounter (Signed)
Pt needs her Vitamin D [facility] [place].

## 2016-09-15 NOTE — Telephone Encounter (Signed)
rx submitted.  

## 2016-09-28 ENCOUNTER — Ambulatory Visit: Payer: Medicare Other | Admitting: Internal Medicine

## 2016-09-30 DIAGNOSIS — I10 Essential (primary) hypertension: Secondary | ICD-10-CM | POA: Diagnosis not present

## 2016-09-30 DIAGNOSIS — E1140 Type 2 diabetes mellitus with diabetic neuropathy, unspecified: Secondary | ICD-10-CM | POA: Diagnosis not present

## 2016-09-30 DIAGNOSIS — E7800 Pure hypercholesterolemia, unspecified: Secondary | ICD-10-CM | POA: Diagnosis not present

## 2016-09-30 DIAGNOSIS — G4700 Insomnia, unspecified: Secondary | ICD-10-CM | POA: Diagnosis not present

## 2016-09-30 DIAGNOSIS — L84 Corns and callosities: Secondary | ICD-10-CM | POA: Diagnosis not present

## 2016-10-05 DIAGNOSIS — Z933 Colostomy status: Secondary | ICD-10-CM | POA: Diagnosis not present

## 2016-10-08 DIAGNOSIS — J45909 Unspecified asthma, uncomplicated: Secondary | ICD-10-CM | POA: Diagnosis not present

## 2016-10-08 DIAGNOSIS — M654 Radial styloid tenosynovitis [de Quervain]: Secondary | ICD-10-CM | POA: Diagnosis not present

## 2016-10-19 DIAGNOSIS — E1142 Type 2 diabetes mellitus with diabetic polyneuropathy: Secondary | ICD-10-CM | POA: Insufficient documentation

## 2016-10-19 DIAGNOSIS — R234 Changes in skin texture: Secondary | ICD-10-CM | POA: Diagnosis not present

## 2016-10-29 ENCOUNTER — Other Ambulatory Visit: Payer: Self-pay | Admitting: Internal Medicine

## 2016-11-08 DIAGNOSIS — J45909 Unspecified asthma, uncomplicated: Secondary | ICD-10-CM | POA: Diagnosis not present

## 2016-11-10 ENCOUNTER — Ambulatory Visit (INDEPENDENT_AMBULATORY_CARE_PROVIDER_SITE_OTHER): Payer: Medicare Other | Admitting: Internal Medicine

## 2016-11-10 VITALS — BP 114/90 | HR 97 | Ht 68.0 in | Wt 312.2 lb

## 2016-11-10 DIAGNOSIS — IMO0002 Reserved for concepts with insufficient information to code with codable children: Secondary | ICD-10-CM

## 2016-11-10 DIAGNOSIS — E1142 Type 2 diabetes mellitus with diabetic polyneuropathy: Secondary | ICD-10-CM

## 2016-11-10 DIAGNOSIS — E1165 Type 2 diabetes mellitus with hyperglycemia: Secondary | ICD-10-CM

## 2016-11-10 DIAGNOSIS — E559 Vitamin D deficiency, unspecified: Secondary | ICD-10-CM | POA: Diagnosis not present

## 2016-11-10 LAB — POCT GLYCOSYLATED HEMOGLOBIN (HGB A1C): HEMOGLOBIN A1C: 8.9

## 2016-11-10 LAB — VITAMIN D 25 HYDROXY (VIT D DEFICIENCY, FRACTURES): VITD: 45.42 ng/mL (ref 30.00–100.00)

## 2016-11-10 NOTE — Progress Notes (Signed)
Patient ID: Gavin Smith, male   DOB: Jul 14, 1976, 41 y.o.   MRN: [id number]  HPI: Gavin Smith is a 41 y.o.-year-old male, initially referred by her PCP, Dr. Shirleen Schirmer, for management of DM2, dx in 39 (41 y/o), insulin-dependent, uncontrolled, wit complications (PN). Last visit 5 mo ago. Now seeing Dr. Jenean Lindau. She is here with her service dog, Merrell.  She joined the [facility] in 09/2016 >> lost weight - 9 lbs in last . She also changed her diet: cut out dairy, sodas, starches. She does have some carbs.  DM2: Reviewed hx: She has a h/o autonomic neuropathy of the GI tract (dx in 1986, when she was 41 y/o - not related to DM - severe diarrhea: "food goes through me"), [place]. [place] She now has a permanent colostomy - sx in 09/2014.   Last hemoglobin A1c was: Lab Results  Component Value Date   HGBA1C 7.8 (H) 07/22/2016   HGBA1C 7.4 05/31/2016   HGBA1C 8.0 08/12/2015  02/28/2015: HbA1c 6.9% 10/21/2014: HbA1c 6.9% - after changing from Humalog to NovoLog  07/23/2014: HbA1c  10.9%ICA Abs negative (01/31/2012) C-pp 5.0, Glu 218 (01/25/2012)  She high A1c levels in the past as her insurance did not approve NovoLog and she was forced to change to Humalog/Humulin. Since then, I sent the PA to her insurance and NovoLog was approved, but it is apparent that this form has to be submitted every year!!  She is on: - Levemir 42 units at bedtime >> 22 units 2x - NovoLog:  - ICR 1:4 - but 1:2 for potatoes and rice  - target: 150  - ISF: 150-200: 4 units, 200-230: 8 units, 230-and higher: 10 units She cannot digest pills. She tried oral meds in the past >> "they went right through me". She also tried Lantus >> this did not work for her >> sugars unchanged despite increasing the dose.  She uses an Financial controller.  Pt checks her sugars 1-3x a day: - Fasting:162-224 >> 67 (overcorrected a high), 173-228  >> 317, 401 >> 161 >> 160-190 - 2h after b'fast: 160-262, 290 >>  183-263 >> n/c  - before lunch:  N/c >> same >> 127-175 >> 383 >> 132 >> n/c - 2h after lunch: n/c >> 224-287 >> 173-272 >> 204-405 >> 76 >> n/c - before dinner: 103-224 >> 104, 152-226 >> 311-493 >> 210, 319 >> 200s (after this, she exercises) - 2h after dinner: 222-384 >> 65, 72, 117-300 >> 292-391 >> 130, 174-310 >> n/c - bedtime: see above >> 86, 91-200, 332 >> see above >> 66 x1, 106-196 >> 147-200 - nighttime: 154, 193, 228 >> 73-110 (takes insulin if high after dinner) >> 69-217, 285 >> n/c + recent lows - 43 x1 - dog woke her up >> recently: 66 low 100s. She has hypoglycemia awareness at 60. Highest sugars recently 390s >> 386 >> 400s.  - no CKD: 02/03/2016: 13/0.93, Glu 183 07/02/2015: CMP: Normal, except glucose 216, BUN/creatinine 10/0.81, GFR 93, ALT 39 (0-32), bilirubin 1.3 (0-1.2) 07/23/2014: BUN/creatinine 12/0.9 Component     Latest Ref Rng 08/12/2015  Microalb, Ur     0.0 - 1.9 mg/dL 1.5  Creatinine,U      305.4  MICROALB/CREAT RATIO     0.0 - 30.0 mg/g 0.5   On Lisinopril. - last set of lipids: 02/03/2016: 205/284/55/93 07/02/2015: 206/229/63/97  07/23/2014: 324/868/42/171 AST/ALT: 121/76 Taken off statin b/c hemochromatosis. - last eye exam was in 04/2016 [facility] No DR.  - + numbness and tingling in her feet. On Cymbalta for neuropathy. Sees a podiatrist now.  Vitamin D def.: Lab Results  Component Value Date   VD25OH 35.30 05/31/2016   VD25OH 23.86 (L) 08/12/2015   VD25OH 19.32 (L) 11/28/2014  Vit D 23.5 in 02/03/2016 - on Ergocalciferol 50,000 IU daily. Vit D 13.9 in 06/2014 - despite being on Ergocalciferol 50,000 IU 2x a week. Vit D 19.3 in 11/2014 - repeat, on Ergocalciferol 50,000 IU 2x a week >> dose increased to 4 times a week Vit D 23.86 in 07/2015 - on ergocalciferol 50,000 IU 4x a week >> dose increased to daily   She is now on high-dose vitamin D daily.   Other labs received from PCP, drawn on 07/02/2015: Lab Results  Component  Value Date   TSH 0.762 07/23/2016   She was also dx with hemochromatosis, allergy-triggered asthma. Also, HTN, HL, OSA -on CPAP, transaminitis, GERD, depression. She is on Depo-Provera.  ROS: Constitutional: + weight gain, then loss, no fatigue, no hot flushes, no nocturia Eyes: + blurry vision, no xerophthalmia ENT: no sore throat, no nodules palpated in throat, + dysphagia/no odynophagia, no hoarseness Cardiovascular: no CP/SOB/no palpitations/leg swelling Respiratory: no cough/SOB Gastrointestinal: + heartburn/no N/V/D/C Musculoskeletal: no muscle/no joint aches Skin: no rashes Neurological: no tremors/numbness/tingling/dizziness, + HA  I reviewed pt's medications, allergies, PMH, social hx, family hx, and changes were documented in the history of present illness. Otherwise, unchanged from my initial visit note. She started Tizanidine.  Past Medical History:  Diagnosis Date  . Allergic rhinitis, cause unspecified   . Essential hypertension, benign   . Extrinsic asthma, unspecified   . Generalized osteoarthrosis, involving multiple sites   . Insomnia, unspecified   . Migraine, unspecified, without mention of intractable migraine without mention of status migrainosus   . Pityriasis rosea   . Pure hypercholesterolemia   . Type I (juvenile type) diabetes mellitus without mention of complication, not stated as uncontrolled   . Unspecified sleep apnea    Past Surgical History:  Procedure Laterality Date  . BREAST REDUCTION SURGERY  2005  . CHOLECYSTECTOMY  2000  . RHINOPLASTY  2005  . SPINAL FUSION  2006   L5-S1 spinal fusion   History   Social History  . Marital Status: Single    Spouse Name: N/A  . Number of Children: 0  . Years of Education: 14   Occupational History  . Prev. EMS, now disabled   Social History Main Topics  . Smoking status: Never Smoker   . Smokeless tobacco: Not on file  . Alcohol Use: No  . Drug Use: No   Current Outpatient Prescriptions on  File Prior to Visit  Medication Sig Dispense Refill  . ACCU-CHEK AVIVA PLUS test strip USE TO CHECK BLOOD SUGAR FIVE TIMES A DAY AS DIRECTED 150 each 11  . ACCU-CHEK FASTCLIX LANCETS MISC USE TO CHECK BLOOD SUGAR THREE TIMES A DAY 102 each 4  . albuterol (PROAIR HFA) 108 (90 BASE) MCG/ACT inhaler Inhale 1 puff into the lungs every 4 (four) hours as needed for wheezing.     Marland Kitchen albuterol (PROVENTIL) (2.5 MG/3ML) 0.083% nebulizer solution Take 2.5 mg by nebulization every 6 (six) hours as needed for wheezing.     Marland Kitchen atorvastatin (LIPITOR) 10 MG tablet Take 10 mg by mouth daily.   0  . B-D ULTRAFINE III SHORT PEN 31G X 8 MM MISC   1  . B-D ULTRAFINE III SHORT PEN 31G  X 8 MM MISC USE FOR 5 INJECTIONS DAILY 100 each 3  . benzonatate (TESSALON) 100 MG capsule Take 1 capsule (100 mg total) by mouth 2 (two) times daily as needed for cough. 20 capsule 0  . Blood Glucose Monitoring Suppl (ACCU-CHEK AVIVA CONNECT) W/DEVICE KIT 1 each by Does not apply route daily. Dx: E11.42 1 kit 0  . CALCIUM PO Take 1 tablet by mouth daily.    . famotidine (PEPCID) 20 MG tablet Take 1 tablet (20 mg total) by mouth 2 (two) times daily. 60 tablet 2  . insulin aspart (NOVOLOG FLEXPEN) 100 UNIT/ML FlexPen Inject 50 units into the skin QID (Patient taking differently: Inject 0-50 Units into the skin 4 (four) times daily. Per sliding scale) 60 pen 2  . Insulin Syringe-Needle U-100 31G X 5/16" 0.3 ML MISC Use to inject insulin 3 times daily as directed. 100 each 5  . LEVEMIR FLEXTOUCH 100 UNIT/ML Pen inject 42 units subcutaneously every morning 15 mL 2  . lisinopril (PRINIVIL,ZESTRIL) 10 MG tablet Take 10 mg by mouth daily.      Marland Kitchen loratadine (CLARITIN) 10 MG tablet Take 10 mg by mouth daily.      . Multiple Vitamin (MULTI-DAY PO) Take 1 tablet by mouth daily.      Marland Kitchen PULMICORT 0.5 MG/2ML nebulizer solution Take 2 mLs by nebulization daily as needed for wheezing.    . senna-docusate (SENOKOT-S) 8.6-50 MG tablet Take 1 tablet by mouth  2 (two) times daily. 60 tablet 1  . tiZANidine (ZANAFLEX) 2 MG tablet Take 2 mg by mouth every 8 (eight) hours as needed for muscle spasms.     . traMADol (ULTRAM) 50 MG tablet Take 100 mg by mouth at bedtime.     . vitamin C (ASCORBIC ACID) 500 MG tablet Take 500 mg by mouth daily.      . Vitamin D, Ergocalciferol, (DRISDOL) 50000 units CAPS capsule Take 1 capsule (50,000 Units total) by mouth daily. 30 capsule 1  . amitriptyline (ELAVIL) 25 MG tablet Take 25 mg by mouth.    . DULoxetine (CYMBALTA) 30 MG capsule Take 30 mg by mouth.     No current facility-administered medications on file prior to visit.    Allergies  Allergen Reactions  . Fenofibrate Other (See Comments)    Severe leg pain  . Humalog [Insulin Lispro] Other (See Comments)    Drug tolerance >> ineffectiveness! Pt needs NovoLog instead!  Marland Kitchen Morphine And Related   . Penicillins    Family History  Problem Relation Age of Onset  . Diabetes Mother   . Hypertension Mother   . Hyperlipidemia Mother   . Hypertension Father   . Hyperlipidemia Father   . Diabetes Maternal Grandmother   . Cancer Maternal Grandmother     Breast Cancer  . Hypertension Maternal Grandfather   . Heart disease Maternal Grandfather   . Heart disease Paternal Grandfather   . Cancer Other     Ovarian Cancer-Grandparent   PE: BP 114/90 (BP Location: Left Arm, Patient Position: Sitting, Cuff Size: Large)   Pulse 97   Ht _0  (1.727 m)   Wt (!) 312 lb 3.2 oz (141.6 kg)   BMI 47.47 kg/m  Body mass index is 47.47 kg/m. Wt Readings from Last 3 Encounters:  11/10/16 (!) 312 lb 3.2 oz (141.6 kg)  07/22/16 (!) 314 lb 14.4 oz (142.8 kg)  05/31/16 (!) 321 lb (145.6 kg)   Constitutional: obese, in NAD; she is here with her  dog Marily Memos) Eyes: PERRLA, EOMI, no exophthalmos ENT: moist mucous membranes, no thyromegaly, no cervical lymphadenopathy Cardiovascular: tachycardia, RR, No MRG Respiratory: CTA B Gastrointestinal: abdomen soft, NT, ND,  BS+ Musculoskeletal: no deformities, strength intact in all 4 Skin: moist, warm, no rashes Neurological: no tremor with outstretched hands, DTR normal in all 4  ASSESSMENT: 1. DM2, insulin-dependent, uncontrolled, with complications - PN  Humalog did not work for her >> sugars >200 constantly. Since she cannot use Humalog, we sent and then re-sent a PA for NovoLog (last form sent in 07/2015). NovoLog was finally approved for her, but this form has to be sent every year!  2. Vit D def  PLAN:  1. Patient with long-standing, uncontrolled diabetes, on Basal-bolus insulin regimen, with worse control compared to last visit (HbA1c 8.9% today), as she had again to switch from Humalog to NovoLog. More recently, she was using NovoLog from a neighbor that has died. However, she is close to running out of it and will need to send another preauthorization for NovoLog for this year. - She recently started to exercise and improve her diet. She brings a remarkable log in which she documents her exercise and her food. She already lost 9 pounds and her sugars are improving. Therefore, I would not make changes in her insulin regimen for now. I will check a fructosamine level to give [place] a better blames for her control in the last 3 weeks. - Pt has had a lot of problems controlling her DM over the years 2/2 impredictability of retaining the food she eats due to her autonomic GI neuropathy. She now has a permanent colostomy >> smaller meals but more frequently. - I suggested to:  Patient Instructions  Please continue: - Levemir 22 units 2x a day. - NovoLog:  - ICR 1:4 - but 1:2 for potatoes and rice  - target: 150  - ISF: 150-200: 4 units, 200-230: 8 units, 230-and higher: 10 units  Please stop at the lab.  Please return in 6 months with your sugar log.     - continue checking sugars at different times of the day - check 2-3 times a day, rotating checks - HbA1c today >> 8.9% (Worse!) - she is up-to-date  with yearly eye exams - Return to clinic in 6 mo with sugar log- per her preference  2. Vit D def - reviewed previous level >> normalized on Ergocalciferol to 50,000 IU daily.  - Will recheck her level today   Component     Latest Ref Rng & Units 11/10/2016  Hemoglobin A1C      8.9  VITD     30.00 - 100.00 ng/mL 45.42  Fructosamine     190 - 270 umol/L 336 (H)  Normal vitamin D.  The HbA1c calculated from the fructosamine was 7.3, better than the measured HbA1c!  Philemon Kingdom, MD PhD [facility] Endocrinology

## 2016-11-10 NOTE — Patient Instructions (Addendum)
Please continue: - Levemir 22 units 2x a day. - NovoLog:  - ICR 1:4 - but 1:2 for potatoes and rice  - target: 150  - ISF: 150-200: 4 units, 200-230: 8 units, 230-and higher: 10 units  Please stop at the lab.  Please return in 6 months with your sugar log.

## 2016-11-12 ENCOUNTER — Encounter: Payer: Self-pay | Admitting: Internal Medicine

## 2016-11-12 LAB — FRUCTOSAMINE: Fructosamine: 336 umol/L — ABNORMAL HIGH (ref 190–270)

## 2016-11-18 ENCOUNTER — Other Ambulatory Visit: Payer: Self-pay

## 2016-11-18 MED ORDER — INSULIN ASPART 100 UNIT/ML FLEXPEN: 0.0000 [IU] | PEN_INJECTOR | Freq: Four times a day (QID) | SUBCUTANEOUS | 0 refills | Status: DC

## 2016-11-18 MED ORDER — INSULIN ASPART 100 UNIT/ML FLEXPEN: PEN_INJECTOR | SUBCUTANEOUS | 0 refills | Status: DC

## 2016-12-08 DIAGNOSIS — J45909 Unspecified asthma, uncomplicated: Secondary | ICD-10-CM | POA: Diagnosis not present

## 2017-01-08 DIAGNOSIS — J45909 Unspecified asthma, uncomplicated: Secondary | ICD-10-CM | POA: Diagnosis not present

## 2017-01-19 DIAGNOSIS — Z933 Colostomy status: Secondary | ICD-10-CM | POA: Diagnosis not present

## 2017-02-07 DIAGNOSIS — J45909 Unspecified asthma, uncomplicated: Secondary | ICD-10-CM | POA: Diagnosis not present

## 2017-02-18 IMAGING — CR DG CHEST 1V PORT
1 series · 1 of 1 positions shown · non-contrast
Comparison: Chest x-ray 06/09/2014.

CLINICAL DATA: 40-year-old female with history of asthma
exacerbation yesterday evening. Possible syncope.

EXAM:
PORTABLE CHEST 1 VIEW

[portable]
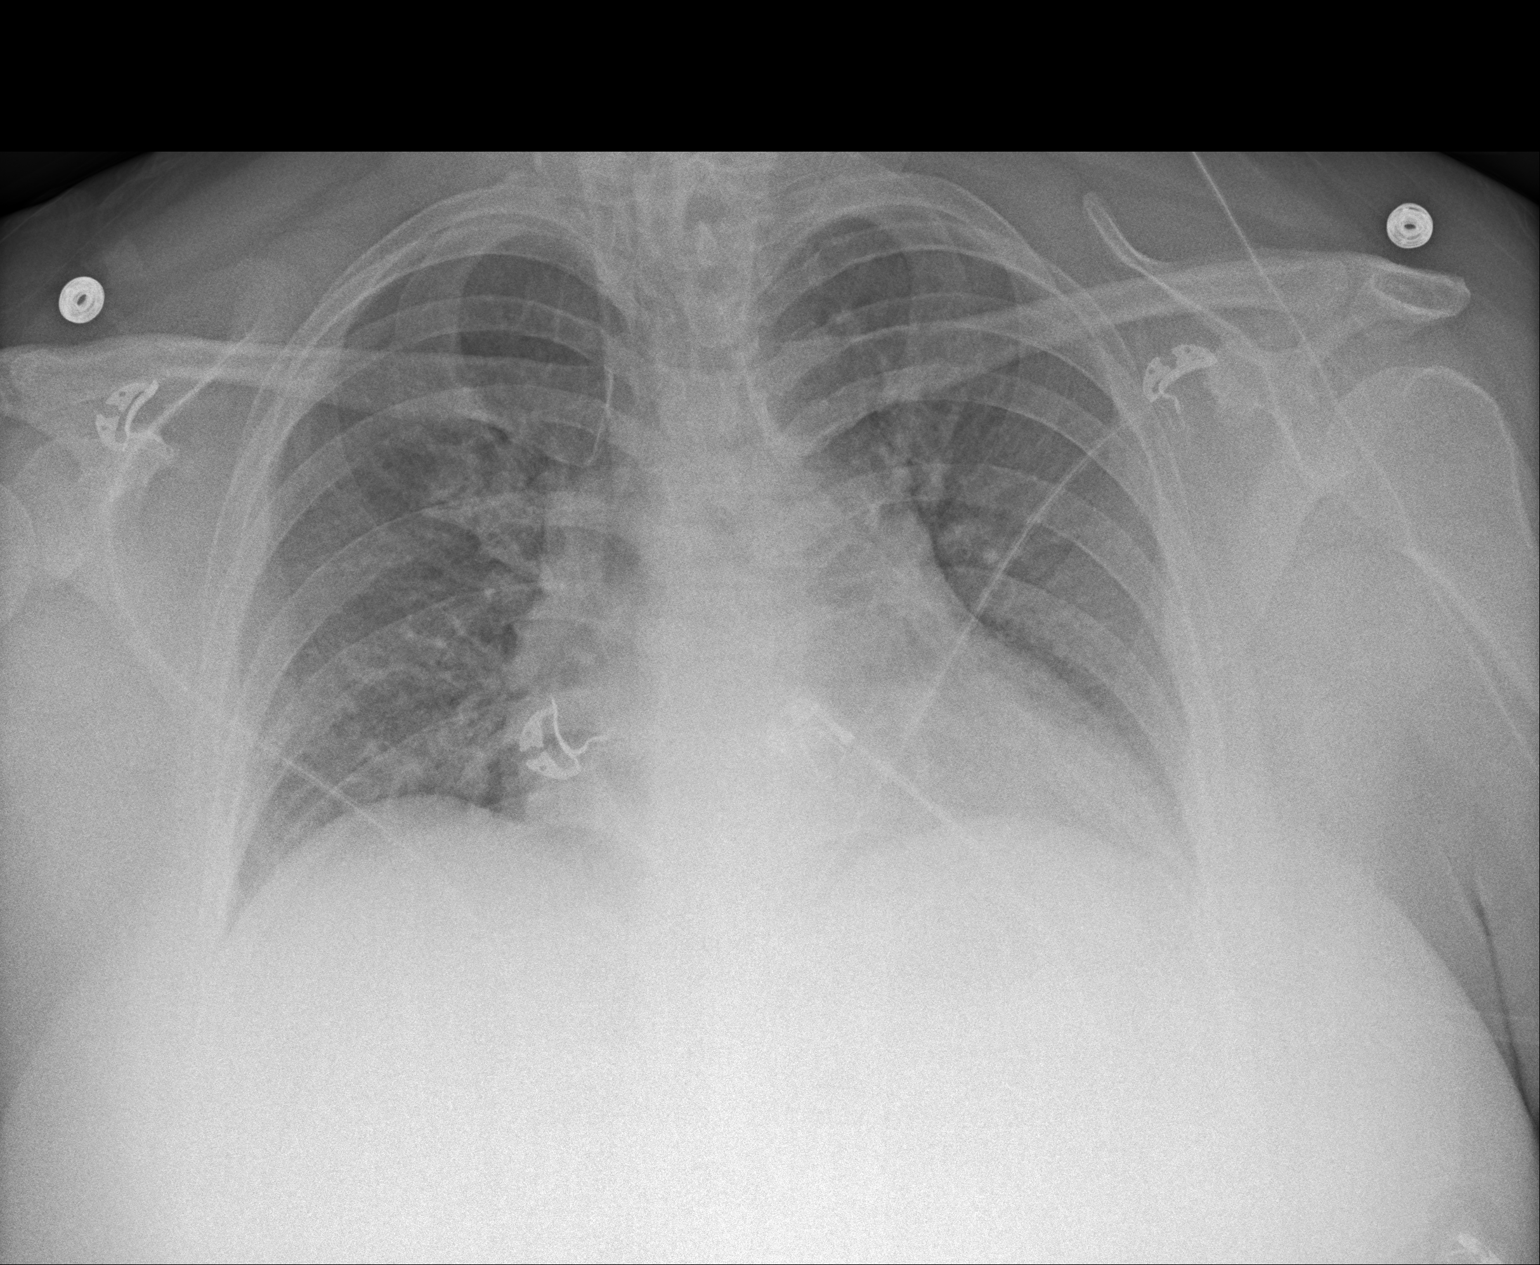

[1 of 1 positions shown; findings below may reference images not displayed]

FINDINGS: Film is underpenetrated limiting the diagnostic sensitivity and
specificity of the examination. With these limitations in mind lung
volumes are low, and there does not appear to be consolidative
airspace disease. No pleural effusions. No pneumothorax. No
pulmonary nodule or mass noted. Pulmonary vasculature and the
cardiomediastinal silhouette are within normal limits allowing for
low lung volumes and portable AP technique.
IMPRESSION: 1. Low lung volumes without radiographic evidence of acute
cardiopulmonary disease.

## 2017-02-20 IMAGING — CR DG ABD PORTABLE 1V
2 series · 2 of 2 positions shown · non-contrast
Comparison: CT 10/31/2013

CLINICAL DATA: LEFT colostomy evaluation

EXAM:
PORTABLE ABDOMEN - 1 VIEW

[AP (1 of 2)]
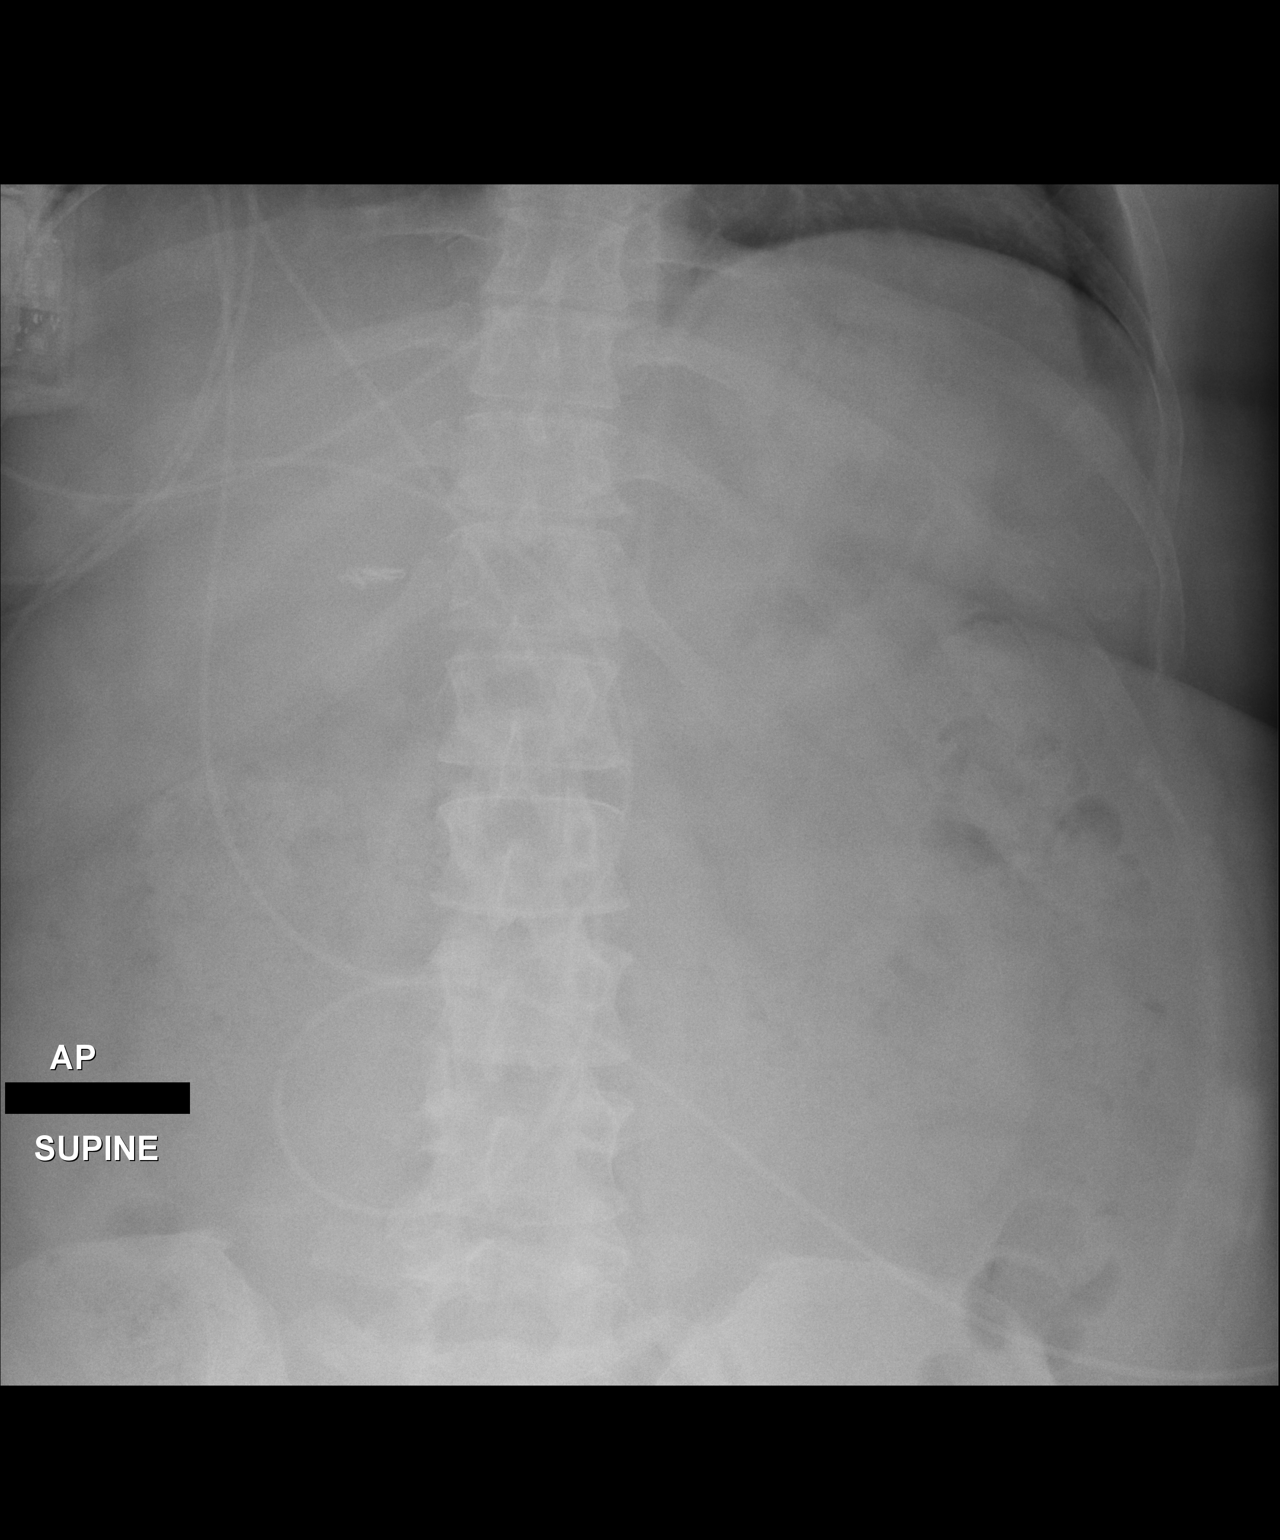

[AP (2 of 2)]
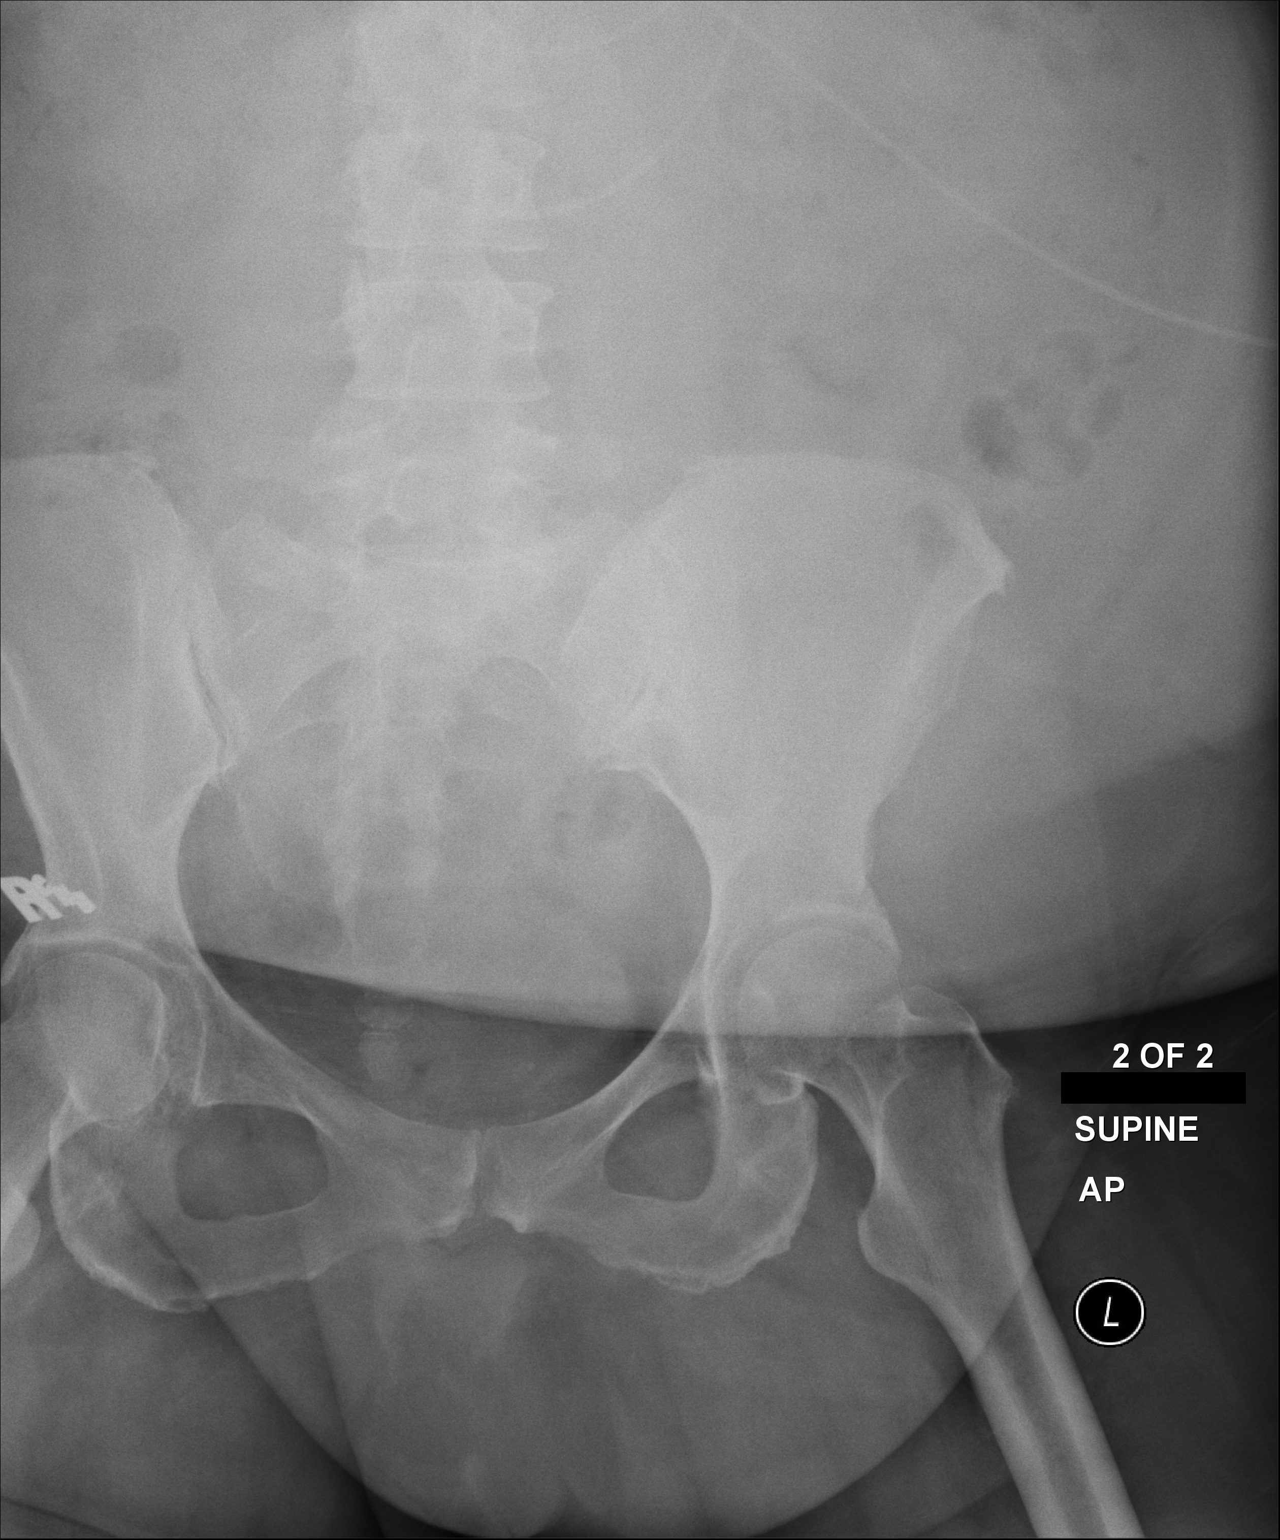

[2 of 2 positions shown; findings below may reference images not displayed]

FINDINGS: Moderate volume of stool through the colon. No dilated large or
small bowel. Colostomy not well appreciated.
IMPRESSION: No complication involving LEFT colostomy appreciated by plain film
radiography. Moderate volume stool.

## 2017-03-02 DIAGNOSIS — L84 Corns and callosities: Secondary | ICD-10-CM | POA: Diagnosis not present

## 2017-03-02 DIAGNOSIS — G4700 Insomnia, unspecified: Secondary | ICD-10-CM | POA: Diagnosis not present

## 2017-03-02 DIAGNOSIS — E7800 Pure hypercholesterolemia, unspecified: Secondary | ICD-10-CM | POA: Diagnosis not present

## 2017-03-02 DIAGNOSIS — E1140 Type 2 diabetes mellitus with diabetic neuropathy, unspecified: Secondary | ICD-10-CM | POA: Diagnosis not present

## 2017-03-02 DIAGNOSIS — I10 Essential (primary) hypertension: Secondary | ICD-10-CM | POA: Diagnosis not present

## 2017-03-10 DIAGNOSIS — J45909 Unspecified asthma, uncomplicated: Secondary | ICD-10-CM | POA: Diagnosis not present

## 2017-03-21 ENCOUNTER — Other Ambulatory Visit: Payer: Self-pay | Admitting: Internal Medicine

## 2017-03-21 DIAGNOSIS — D72829 Elevated white blood cell count, unspecified: Secondary | ICD-10-CM | POA: Diagnosis not present

## 2017-03-22 DIAGNOSIS — G5602 Carpal tunnel syndrome, left upper limb: Secondary | ICD-10-CM | POA: Diagnosis not present

## 2017-03-30 DIAGNOSIS — G5602 Carpal tunnel syndrome, left upper limb: Secondary | ICD-10-CM | POA: Diagnosis not present

## 2017-04-05 DIAGNOSIS — M25542 Pain in joints of left hand: Secondary | ICD-10-CM | POA: Diagnosis not present

## 2017-04-19 DIAGNOSIS — Z933 Colostomy status: Secondary | ICD-10-CM | POA: Diagnosis not present

## 2017-05-12 ENCOUNTER — Ambulatory Visit: Payer: Medicare Other | Admitting: Internal Medicine

## 2017-06-06 DIAGNOSIS — L719 Rosacea, unspecified: Secondary | ICD-10-CM | POA: Diagnosis not present

## 2017-06-06 DIAGNOSIS — L309 Dermatitis, unspecified: Secondary | ICD-10-CM | POA: Diagnosis not present

## 2017-06-14 DIAGNOSIS — E1042 Type 1 diabetes mellitus with diabetic polyneuropathy: Secondary | ICD-10-CM | POA: Diagnosis not present

## 2017-06-14 DIAGNOSIS — G43909 Migraine, unspecified, not intractable, without status migrainosus: Secondary | ICD-10-CM | POA: Diagnosis not present

## 2017-07-12 DIAGNOSIS — K435 Parastomal hernia without obstruction or  gangrene: Secondary | ICD-10-CM | POA: Diagnosis not present

## 2017-07-12 DIAGNOSIS — E119 Type 2 diabetes mellitus without complications: Secondary | ICD-10-CM | POA: Diagnosis not present

## 2017-07-12 DIAGNOSIS — I10 Essential (primary) hypertension: Secondary | ICD-10-CM | POA: Diagnosis not present

## 2017-07-15 ENCOUNTER — Ambulatory Visit (INDEPENDENT_AMBULATORY_CARE_PROVIDER_SITE_OTHER): Payer: Medicare Other | Admitting: Internal Medicine

## 2017-07-15 ENCOUNTER — Encounter: Payer: Self-pay | Admitting: Internal Medicine

## 2017-07-15 VITALS — BP 110/70 | HR 101 | Ht 68.5 in | Wt 287.0 lb

## 2017-07-15 DIAGNOSIS — E1142 Type 2 diabetes mellitus with diabetic polyneuropathy: Secondary | ICD-10-CM

## 2017-07-15 DIAGNOSIS — E1165 Type 2 diabetes mellitus with hyperglycemia: Secondary | ICD-10-CM

## 2017-07-15 DIAGNOSIS — IMO0002 Reserved for concepts with insufficient information to code with codable children: Secondary | ICD-10-CM

## 2017-07-15 DIAGNOSIS — E559 Vitamin D deficiency, unspecified: Secondary | ICD-10-CM | POA: Diagnosis not present

## 2017-07-15 LAB — COMPLETE METABOLIC PANEL WITH GFR
AG Ratio: 1.8 (calc) (ref 1.0–2.5)
ALBUMIN MSPROF: 4.2 g/dL (ref 3.6–5.1)
ALKALINE PHOSPHATASE (APISO): 62 U/L (ref 33–115)
ALT: 26 U/L (ref 6–29)
AST: 23 U/L (ref 10–30)
BILIRUBIN TOTAL: 1.3 mg/dL — AB (ref 0.2–1.2)
BUN: 16 mg/dL (ref 7–25)
CHLORIDE: 97 mmol/L — AB (ref 98–110)
CO2: 25 mmol/L (ref 20–32)
CREATININE: 0.81 mg/dL (ref 0.50–1.10)
Calcium: 9.7 mg/dL (ref 8.6–10.2)
GFR, EST AFRICAN AMERICAN: 106 mL/min/{1.73_m2} (ref >=60)
GFR, Est Non African American: 91 mL/min/{1.73_m2} (ref >=60)
GLUCOSE: 402 mg/dL — AB (ref 65–99)
Globulin: 2.4 g/dL (calc) (ref 1.9–3.7)
Potassium: 4.9 mmol/L (ref 3.5–5.3)
SODIUM: 132 mmol/L — AB (ref 135–146)
TOTAL PROTEIN: 6.6 g/dL (ref 6.1–8.1)

## 2017-07-15 LAB — VITAMIN D 25 HYDROXY (VIT D DEFICIENCY, FRACTURES): VITD: 31.25 ng/mL (ref 30.00–100.00)

## 2017-07-15 LAB — MICROALBUMIN / CREATININE URINE RATIO
Creatinine,U: 76.7 mg/dL
Microalb Creat Ratio: 0.9 mg/g (ref 0.0–30.0)
Microalb, Ur: <0.7 mg/dL (ref 0.0–1.9)

## 2017-07-15 LAB — LIPID PANEL
CHOL/HDL RATIO: 5
CHOLESTEROL: 210 mg/dL — AB (ref 0–200)
HDL: 46.1 mg/dL (ref >39.00)
NONHDL: 163.48
TRIGLYCERIDES: 372 mg/dL — AB (ref 0.0–149.0)
VLDL: 74.4 mg/dL — AB (ref 0.0–40.0)

## 2017-07-15 LAB — POCT GLYCOSYLATED HEMOGLOBIN (HGB A1C): Hemoglobin A1C: 12

## 2017-07-15 LAB — LDL CHOLESTEROL, DIRECT: Direct LDL: 119 mg/dL

## 2017-07-15 MED ORDER — VITAMIN D (ERGOCALCIFEROL) 1.25 MG (50000 UNIT) PO CAPS: 50000.0000 [IU] | ORAL_CAPSULE | Freq: Every day | ORAL | 3 refills | Status: DC

## 2017-07-15 NOTE — Patient Instructions (Addendum)
Please continue: - Levemir 22 units twice a day - NovoLog:  - ICR 1:4 - but 1:2 for potatoes and rice  - target: 150  - ISF: 150-200: 4 units, 200-230: 8 units, 230-and higher: 10 units  Please restart Ergocalciferol 50,000 IU daily.  Please stop at the lab.  Please return in 6 months with your sugar log.

## 2017-07-15 NOTE — Progress Notes (Signed)
Patient ID: Gavin Smith, male   DOB: 10/27/1975, 41 y.o.   MRN: [id number]  HPI: Gavin Smith is a 41 y.o.-year-old male, initially referred by her PCP, Dr. Theador Hawthorne, for management of DM2, dx in 8 (41 y/o), insulin-dependent, uncontrolled, wit complications (PN). Last visit 7 months ago. Now seeing Dr. Junious Dresser. She is here with her service dog, Merrell.  Before last visit, she joined the [facility] in 09/2016 and also change her diet: Cut out dairy, so does, starches.  She started to lose weight >> she is now down 25 pounds since last visit! However, she is in pain due to a parastomal hernia >> could not eat >> did not take insulin >> sugars much higher. Sugars better in last 1 week as pain better (started pain meds). She will have surgery in 1 week.   DM2: Reviewed sx: She has a h/o autonomic neuropathy of the GI tract (dx in 1986, when she was 41 y/o - not related to DM - severe diarrhea: "food goes through me"), [place]. [place] She now has a permanent colostomy - had sx in 09/2014. She started to feel much better after the sx.  Last hemoglobin A1c was: 11/10/2016:The HbA1c calculated from the fructosamine was 7.3%, better than the measured HbA1c! Lab Results  Component Value Date   HGBA1C 8.9 11/10/2016   HGBA1C 7.8 (H) 07/22/2016   HGBA1C 7.4 05/31/2016  02/28/2015: HbA1c 6.9% 10/21/2014: HbA1c 6.9% - after changing from Humalog to NovoLog  07/23/2014: HbA1c 10.9% ICA Abs negative (01/31/2012) C-pp 5.0, Glu 218 (01/25/2012)  She had a high HbA1c levels in the past as her insurance did not approve NovoLog and was forced to change to Humalog/Humulin.  Since then, we need to send preauthorization's to her insurance for NovoLog every year!  She is on - but missed many doses: - Levemir 42 units at bedtime >> 22 units x2 - NovoLog:  - ICR 1:4 - but 1:2 for potatoes and rice  - target: 150  - ISF: 150-200: 4 units, 200-230: 8 units, 230-and higher: 10 units She cannot  digest pills. She tried oral meds in the past >> "they went right through me". She also tried Lantus >> this did not work for her >> sugars unchanged despite increasing the dose.  She uses an Camera operator.  Pt checks her sugars 1-3 times a day: - Fasting: 317, 401 >> 161 >> 160-190 >> 130s in last week - 2h after b'fast: 160-262, 290 >> 183-263 >> n/c  - before lunch: 127-175 >> 383 >> 132 >> n/c (skips lunch) - 2h after lunch: 173-272 >> 204-405 >> 76 >> n/c - before dinner: 311-493 >> 210, 319 >> 200s >> 160-170 - 2h after dinner:  292-391 >> 130, 174-310 >> n/c - bedtime:  66 x1, 106-196 >> 147-200 >> 120-130s, prev. 200-300 - nighttime: 73-110 (takes insulin if high after dinner) >> 69-217, 285 >> n/c + recent lows - 43 x1 - dog woke her up >> 66 >> 49 when not eating. She has hypoglycemia awareness in the 60s.  Highest sugars recently 400s >> 300s w/o pain meds.  - No CKD: 02/03/2016: 13/0.93, Glu 183 07/02/2015: CMP: Normal, except glucose 216, BUN/creatinine 10/0.81, GFR 93, ALT 39 (0-32), bilirubin 1.3 (0-1.2) 07/23/2014: BUN/creatinine 12/0.9 Lab Results  Component Value Date   MICRALBCREAT 0.5 08/12/2015  On lisinopril.  - + HL; last set of lipids: 02/03/2016: 205/284/55/93 07/02/2015: 206/229/63/97  07/23/2014: 324/868/42/171 AST/ALT: 121/76 She  was taken off statins 2/2 hemochromatosis.  - last eye exam was in 04/2016: No [facility]  - She does have numbness and tingling in her feet.  She sees a podiatrist.  She is off Cymbalta. On Neurontin 100 mg 3x a day and a PN cream.   Vitamin D def.:  She requires very high doses of vitamin D, currently on ergocalciferol 50,000 units daily - ran out 2 mo ago and started to drink more milk to compensate.  Latest level was normal at last visit: Lab Results  Component Value Date   VD25OH 45.42 11/10/2016   VD25OH 35.30 05/31/2016   VD25OH 23.86 (L) 08/12/2015   VD25OH 19.32 (L) 11/28/2014  Vit D 23.5 in  02/03/2016 - on Ergocalciferol 50,000 IU daily. Vit D 13.9 in 06/2014 - despite being on Ergocalciferol 50,000 IU 2x a week. Vit D 19.3 in 11/2014 - repeat, on Ergocalciferol 50,000 IU 2x a week >> dose increased to 4 times a week Vit D 23.86 in 07/2015 - on ergocalciferol 50,000 IU 4x a week >> dose increased to daily   Latest TSH: Lab Results  Component Value Date   TSH 0.762 07/23/2016   She also has a history of hemochromatosis, allergy-triggered asthma. Also, HTN, OSA -on CPAP, transaminitis, GERD, depression. She is on Depo-Provera.  ROS: Constitutional: + weight loss, no fatigue, no subjective hyperthermia, no subjective hypothermia Eyes: no blurry vision, no xerophthalmia ENT: no sore throat, no nodules palpated in throat, + dysphagia, no odynophagia, no hoarseness Cardiovascular: no CP/no SOB/no palpitations/no leg swelling Respiratory: no cough/no SOB/no wheezing Gastrointestinal: no N/no V/no D/no C/no acid reflux Musculoskeletal: + muscle aches/+ joint aches Skin: no rashes, no hair loss Neurological: no tremors/+ numbness/+ tingling/no dizziness  I reviewed pt's medications, allergies, PMH, social hx, family hx, and changes were documented in the history of present illness. Otherwise, unchanged from my initial visit note. Off Cymbalta, Amitriptyline, Pepcid, senokot.  Past Medical History:  Diagnosis Date  . Allergic rhinitis, cause unspecified   . Essential hypertension, benign   . Extrinsic asthma, unspecified   . Generalized osteoarthrosis, involving multiple sites   . Insomnia, unspecified   . Migraine, unspecified, without mention of intractable migraine without mention of status migrainosus   . Pityriasis rosea   . Pure hypercholesterolemia   . Type I (juvenile type) diabetes mellitus without mention of complication, not stated as uncontrolled   . Unspecified sleep apnea    Past Surgical History:  Procedure Laterality Date  . BREAST REDUCTION SURGERY  2005   . CHOLECYSTECTOMY  2000  . RHINOPLASTY  2005  . SPINAL FUSION  2006   L5-S1 spinal fusion   History   Social History  . Marital Status: Single    Spouse Name: N/A  . Number of Children: 0  . Years of Education: 14   Occupational History  . Prev. EMS, now disabled   Social History Main Topics  . Smoking status: Never Smoker   . Smokeless tobacco: Not on file  . Alcohol Use: No  . Drug Use: No   Current Outpatient Medications on File Prior to Visit  Medication Sig Dispense Refill  . ACCU-CHEK AVIVA PLUS test strip USE TO CHECK BLOOD SUGAR FIVE TIMES A DAY AS DIRECTED 150 each 11  . ACCU-CHEK FASTCLIX LANCETS MISC USE TO CHECK BLOOD SUGAR THREE TIMES A DAY 102 each 4  . albuterol (PROAIR HFA) 108 (90 BASE) MCG/ACT inhaler Inhale 1 puff into the lungs every 4 (  four) hours as needed for wheezing.     Marland Kitchen. albuterol (PROVENTIL) (2.5 MG/3ML) 0.083% nebulizer solution Take 2.5 mg by nebulization every 6 (six) hours as needed for wheezing.     Marland Kitchen. atorvastatin (LIPITOR) 10 MG tablet Take 10 mg by mouth daily.   0  . B-D ULTRAFINE III SHORT PEN 31G X 8 MM MISC USE FOR 5 INJECTIONS DAILY 100 each 3  . CALCIUM PO Take 1 tablet by mouth daily.    . insulin aspart (NOVOLOG FLEXPEN) 100 UNIT/ML FlexPen 0-50 units 4 times daily by sliding scale. Max daily dose 200 units. 60 pen 0  . Insulin Syringe-Needle U-100 31G X 5/16" 0.3 ML MISC Use to inject insulin 3 times daily as directed. 100 each 5  . LEVEMIR FLEXTOUCH 100 UNIT/ML Pen inject 42 units subcutaneously every morning 15 mL 2  . lisinopril (PRINIVIL,ZESTRIL) 10 MG tablet Take 10 mg by mouth daily.      Marland Kitchen. loratadine (CLARITIN) 10 MG tablet Take 10 mg by mouth daily.      . Multiple Vitamin (MULTI-DAY PO) Take 1 tablet by mouth daily.      Marland Kitchen. PULMICORT 0.5 MG/2ML nebulizer solution Take 2 mLs by nebulization daily as needed for wheezing.    . rizatriptan (MAXALT-MLT) 10 MG disintegrating tablet Take at onset of headache, May repeat in 2 hours  if needed but no more than 2 in 24 hours    . tiZANidine (ZANAFLEX) 2 MG tablet Take 2 mg by mouth every 8 (eight) hours as needed for muscle spasms.     . traMADol (ULTRAM) 50 MG tablet Take 100 mg by mouth at bedtime.     . vitamin C (ASCORBIC ACID) 500 MG tablet Take 500 mg by mouth daily.      . Vitamin D, Ergocalciferol, (DRISDOL) 50000 units CAPS capsule Take 1 capsule (50,000 Units total) by mouth daily. 30 capsule 1  . DULoxetine (CYMBALTA) 30 MG capsule Take 30 mg by mouth.     No current facility-administered medications on file prior to visit.    Allergies  Allergen Reactions  . Fenofibrate Other (See Comments)    Severe leg pain  . Humalog [Insulin Lispro] Other (See Comments)    Drug tolerance >> ineffectiveness! Pt needs NovoLog instead!  Marland Kitchen. Morphine And Related   . Penicillins    Family History  Problem Relation Age of Onset  . Diabetes Mother   . Hypertension Mother   . Hyperlipidemia Mother   . Hypertension Father   . Hyperlipidemia Father   . Diabetes Maternal Grandmother   . Cancer Maternal Grandmother        Breast Cancer  . Hypertension Maternal Grandfather   . Heart disease Maternal Grandfather   . Heart disease Paternal Grandfather   . Cancer Other        Ovarian Cancer-Grandparent   PE: BP 110/70   Pulse (!) 101   Ht 5' 8.5" (1.74 m)   Wt 287 lb (130.2 kg)   SpO2 96%   BMI 43.00 kg/m  Body mass index is 43 kg/m. Wt Readings from Last 3 Encounters:  07/15/17 287 lb (130.2 kg)  11/10/16 (!) 312 lb 3.2 oz (141.6 kg)  07/22/16 (!) 314 lb 14.4 oz (142.8 kg)   Constitutional: overweight, in NAD Eyes: PERRLA, EOMI, no exophthalmos ENT: moist mucous membranes, no thyromegaly, no cervical lymphadenopathy Cardiovascular: Tachycardia, RR, No MRG Respiratory: CTA B Gastrointestinal: abdomen soft, NT, ND, BS+ Musculoskeletal: no deformities, strength intact in all  4 Skin: moist, warm, no rashes Neurological: no tremor with outstretched hands, DTR  normal in all 4  ASSESSMENT: 1. DM2, insulin-dependent, uncontrolled, with complications - PN  Humalog did not work for her >> sugars >200 constantly. Since she cannot use Humalog, we sent and then re-sent a PA for NovoLog (last form sent in 07/2015). NovoLog was finally approved for her, but this form has to be sent every year!  2. Vit D def  PLAN:  1. Patient with long standing, uncontrolled DM, now with much worse control after she developed a parastomal hernia and was in a lot of pain until she got pain meds last week. Sugars were high as she was not eating well and not taking the insulin and also b/c of the pain.  We discussed that even if she cannot eat, she still needs to check her sugars and bolus insulin but follow the sugars closely to make sure she does not develop hypoglycemia.  However, sugars did improve in the last week after she was started on pain medicines.  They are almost back to baseline.  Therefore, I cannot change her regimen for now, but continue the previous doses, taking them consistently - At this visit I suggested that she tried a freestyle libre CGM and explained how this works.  I gave her a list of providers and she will check with her insurance whether the CGM is covered. - Patient had a lot of problems controlling her diabetes over the years due to in predictability of retaining the food she ate in the setting of her autonomic GI neuropathy.  Now with a permanent colostomy she eats smaller meals but more frequently.  She is on an unusual regimen of more mealtime insulin the Levemir, which was working fairly well for her in the past, but not in the last few months for the reason described above. - She increase her milk intake in the last 2 months and I feel that this contributes to her uncontrolled diabetes.  Advised to change to almond milk. - I suggested to:  Patient Instructions  Please continue: - Levemir 22 units twice a day - NovoLog:  - ICR 1:4 - but 1:2 for  potatoes and rice  - target: 150  - ISF: 150-200: 4 units, 200-230: 8 units, 230-and higher: 10 units  Please restart Ergocalciferol 50,000 IU daily.  Please stop at the lab.  Please return in 6 months with your sugar log.     - today, HbA1c is 12% (much higher) - continue checking sugars at different times of the day - check 3-4x a day, rotating checks - advised for yearly eye exams >> she is due - will check CMP and Lipids >> she is fasting - Return to clinic in 6 mo (per her preference) with sugar log   2. Vit D def - Reviewed her previous levels of vitamin D  - levels normalized on ergocalciferol 50,000 units daily - We will recheck her vitamin D today, but she ran out 2 mo ago and switched to drinking more milk >> advised not to do so b/c this will worsen her DM  Component     Latest Ref Rng & Units 07/15/2017  Glucose     65 - 99 mg/dL 161 (H)  BUN     7 - 25 mg/dL 16  Creatinine     0.96 - 1.10 mg/dL 0.45  GFR, Est Non African American     > OR = 60 mL/min/1.71m2 91  GFR, Est African American     > OR = 60 mL/min/1.1m2 106  BUN/Creatinine Ratio     6 - 22 (calc) NOT APPLICABLE  Sodium     135 - 146 mmol/L 132 (L)  Potassium     3.5 - 5.3 mmol/L 4.9  Chloride     98 - 110 mmol/L 97 (L)  CO2     20 - 32 mmol/L 25  Calcium     8.6 - 10.2 mg/dL 9.7  Total Protein     6.1 - 8.1 g/dL 6.6  Albumin MSPROF     3.6 - 5.1 g/dL 4.2  Globulin     1.9 - 3.7 g/dL (calc) 2.4  AG Ratio     1.0 - 2.5 (calc) 1.8  Total Bilirubin     0.2 - 1.2 mg/dL 1.3 (H)  Alkaline phosphatase (APISO)     33 - 115 U/L 62  AST     10 - 30 U/L 23  ALT     6 - 29 U/L 26  Cholesterol     0 - 200 mg/dL 161 (H)  Triglycerides     0.0 - 149.0 mg/dL 096.0 (H)  HDL Cholesterol     >39.00 mg/dL 45.40  VLDL     0.0 - 98.1 mg/dL 19.1 (H)  Total CHOL/HDL Ratio      5  NonHDL      163.48  Microalb, Ur     0.0 - 1.9 mg/dL <4.7  Creatinine,U     mg/dL 82.9  MICROALB/CREAT RATIO      0.0 - 30.0 mg/g 0.9  VITD     30.00 - 100.00 ng/mL 31.25  Hemoglobin A1C      12  Direct LDL     mg/dL 562.1   High Glu >> which decreased after the visit:  Donato Schultz, DO    07/17/17 2:48 PM  Note    Lab called with critical lab value glucose 402 Called pt because [facility] did not have endo number Pt had forgotten to take her meds that day Glucose 135 this am     Triglycerides and LDL cholesterol also high. Vitamin D low normal.   No urinary proteins. Total bilirubin slightly high, at 1.3 (upper limit of normal 1.2).  ALT and AST normal.  Carlus Pavlov, MD PhD [facility] Endocrinology

## 2017-07-17 ENCOUNTER — Telehealth: Payer: Self-pay | Admitting: Family Medicine

## 2017-07-17 NOTE — Telephone Encounter (Signed)
Lab called with critical lab value glucose 402 Called pt because [facility] did not have endo number Pt had forgotten to take her meds that day Glucose 135 this am She will f/u endo

## 2017-07-20 ENCOUNTER — Encounter: Payer: Self-pay | Admitting: Internal Medicine

## 2017-07-21 ENCOUNTER — Other Ambulatory Visit: Payer: Self-pay | Admitting: Internal Medicine

## 2017-07-21 MED ORDER — FENOFIBRATE 145 MG PO TABS: 145.0000 mg | ORAL_TABLET | Freq: Every day | ORAL | 3 refills | Status: DC

## 2017-07-22 DIAGNOSIS — Z9119 Patient's noncompliance with other medical treatment and regimen: Secondary | ICD-10-CM | POA: Diagnosis not present

## 2017-07-22 DIAGNOSIS — L719 Rosacea, unspecified: Secondary | ICD-10-CM | POA: Diagnosis not present

## 2017-07-22 DIAGNOSIS — E1143 Type 2 diabetes mellitus with diabetic autonomic (poly)neuropathy: Secondary | ICD-10-CM | POA: Diagnosis not present

## 2017-07-22 DIAGNOSIS — Z981 Arthrodesis status: Secondary | ICD-10-CM | POA: Diagnosis not present

## 2017-07-22 DIAGNOSIS — K430 Incisional hernia with obstruction, without gangrene: Secondary | ICD-10-CM | POA: Insufficient documentation

## 2017-07-22 DIAGNOSIS — K433 Parastomal hernia with obstruction, without gangrene: Secondary | ICD-10-CM | POA: Diagnosis not present

## 2017-07-22 DIAGNOSIS — E1142 Type 2 diabetes mellitus with diabetic polyneuropathy: Secondary | ICD-10-CM | POA: Diagnosis not present

## 2017-07-22 DIAGNOSIS — E1043 Type 1 diabetes mellitus with diabetic autonomic (poly)neuropathy: Secondary | ICD-10-CM | POA: Diagnosis not present

## 2017-07-22 DIAGNOSIS — G4733 Obstructive sleep apnea (adult) (pediatric): Secondary | ICD-10-CM | POA: Diagnosis not present

## 2017-07-22 DIAGNOSIS — K3184 Gastroparesis: Secondary | ICD-10-CM | POA: Diagnosis not present

## 2017-07-22 DIAGNOSIS — E7800 Pure hypercholesterolemia, unspecified: Secondary | ICD-10-CM | POA: Diagnosis not present

## 2017-07-22 DIAGNOSIS — E1065 Type 1 diabetes mellitus with hyperglycemia: Secondary | ICD-10-CM | POA: Diagnosis not present

## 2017-07-22 DIAGNOSIS — I10 Essential (primary) hypertension: Secondary | ICD-10-CM | POA: Diagnosis not present

## 2017-07-22 DIAGNOSIS — Z794 Long term (current) use of insulin: Secondary | ICD-10-CM | POA: Diagnosis not present

## 2017-07-22 DIAGNOSIS — Z932 Ileostomy status: Secondary | ICD-10-CM | POA: Diagnosis not present

## 2017-07-22 HISTORY — DX: Incisional hernia with obstruction, without gangrene: K43.0

## 2017-07-23 DIAGNOSIS — K433 Parastomal hernia with obstruction, without gangrene: Secondary | ICD-10-CM | POA: Diagnosis not present

## 2017-07-23 DIAGNOSIS — K3184 Gastroparesis: Secondary | ICD-10-CM | POA: Diagnosis not present

## 2017-07-23 DIAGNOSIS — E1142 Type 2 diabetes mellitus with diabetic polyneuropathy: Secondary | ICD-10-CM | POA: Diagnosis not present

## 2017-07-23 DIAGNOSIS — Z9119 Patient's noncompliance with other medical treatment and regimen: Secondary | ICD-10-CM | POA: Diagnosis not present

## 2017-07-23 DIAGNOSIS — E1065 Type 1 diabetes mellitus with hyperglycemia: Secondary | ICD-10-CM | POA: Diagnosis not present

## 2017-07-23 DIAGNOSIS — G4733 Obstructive sleep apnea (adult) (pediatric): Secondary | ICD-10-CM | POA: Diagnosis not present

## 2017-07-23 DIAGNOSIS — L719 Rosacea, unspecified: Secondary | ICD-10-CM | POA: Diagnosis not present

## 2017-07-23 DIAGNOSIS — Z981 Arthrodesis status: Secondary | ICD-10-CM | POA: Diagnosis not present

## 2017-07-23 DIAGNOSIS — R9431 Abnormal electrocardiogram [ECG] [EKG]: Secondary | ICD-10-CM | POA: Diagnosis not present

## 2017-07-23 DIAGNOSIS — Z932 Ileostomy status: Secondary | ICD-10-CM | POA: Diagnosis not present

## 2017-07-23 DIAGNOSIS — K430 Incisional hernia with obstruction, without gangrene: Secondary | ICD-10-CM | POA: Diagnosis not present

## 2017-07-23 DIAGNOSIS — E1043 Type 1 diabetes mellitus with diabetic autonomic (poly)neuropathy: Secondary | ICD-10-CM | POA: Diagnosis not present

## 2017-07-23 DIAGNOSIS — I10 Essential (primary) hypertension: Secondary | ICD-10-CM | POA: Diagnosis not present

## 2017-07-23 DIAGNOSIS — Z794 Long term (current) use of insulin: Secondary | ICD-10-CM | POA: Diagnosis not present

## 2017-07-23 DIAGNOSIS — Z23 Encounter for immunization: Secondary | ICD-10-CM | POA: Diagnosis not present

## 2017-07-23 DIAGNOSIS — E7800 Pure hypercholesterolemia, unspecified: Secondary | ICD-10-CM | POA: Diagnosis not present

## 2017-07-23 DIAGNOSIS — R000 Tachycardia, unspecified: Secondary | ICD-10-CM | POA: Diagnosis not present

## 2017-07-23 DIAGNOSIS — E1143 Type 2 diabetes mellitus with diabetic autonomic (poly)neuropathy: Secondary | ICD-10-CM | POA: Diagnosis not present

## 2017-07-24 DIAGNOSIS — E1143 Type 2 diabetes mellitus with diabetic autonomic (poly)neuropathy: Secondary | ICD-10-CM | POA: Diagnosis not present

## 2017-07-24 DIAGNOSIS — R9431 Abnormal electrocardiogram [ECG] [EKG]: Secondary | ICD-10-CM | POA: Diagnosis not present

## 2017-07-24 DIAGNOSIS — K433 Parastomal hernia with obstruction, without gangrene: Secondary | ICD-10-CM | POA: Diagnosis not present

## 2017-07-24 DIAGNOSIS — E1065 Type 1 diabetes mellitus with hyperglycemia: Secondary | ICD-10-CM | POA: Diagnosis not present

## 2017-07-24 DIAGNOSIS — Z932 Ileostomy status: Secondary | ICD-10-CM | POA: Diagnosis not present

## 2017-07-24 DIAGNOSIS — I10 Essential (primary) hypertension: Secondary | ICD-10-CM | POA: Diagnosis not present

## 2017-07-24 DIAGNOSIS — K430 Incisional hernia with obstruction, without gangrene: Secondary | ICD-10-CM | POA: Diagnosis not present

## 2017-07-24 DIAGNOSIS — Z23 Encounter for immunization: Secondary | ICD-10-CM | POA: Diagnosis not present

## 2017-07-24 DIAGNOSIS — Z9119 Patient's noncompliance with other medical treatment and regimen: Secondary | ICD-10-CM | POA: Diagnosis not present

## 2017-07-24 DIAGNOSIS — Z981 Arthrodesis status: Secondary | ICD-10-CM | POA: Diagnosis not present

## 2017-07-24 DIAGNOSIS — E1043 Type 1 diabetes mellitus with diabetic autonomic (poly)neuropathy: Secondary | ICD-10-CM | POA: Diagnosis not present

## 2017-07-24 DIAGNOSIS — E1142 Type 2 diabetes mellitus with diabetic polyneuropathy: Secondary | ICD-10-CM | POA: Diagnosis not present

## 2017-07-24 DIAGNOSIS — L719 Rosacea, unspecified: Secondary | ICD-10-CM | POA: Diagnosis not present

## 2017-07-24 DIAGNOSIS — E7800 Pure hypercholesterolemia, unspecified: Secondary | ICD-10-CM | POA: Diagnosis not present

## 2017-07-24 DIAGNOSIS — Z794 Long term (current) use of insulin: Secondary | ICD-10-CM | POA: Diagnosis not present

## 2017-07-24 DIAGNOSIS — K3184 Gastroparesis: Secondary | ICD-10-CM | POA: Diagnosis not present

## 2017-07-24 DIAGNOSIS — G4733 Obstructive sleep apnea (adult) (pediatric): Secondary | ICD-10-CM | POA: Diagnosis not present

## 2017-07-24 DIAGNOSIS — R000 Tachycardia, unspecified: Secondary | ICD-10-CM | POA: Diagnosis not present

## 2017-07-25 DIAGNOSIS — Z981 Arthrodesis status: Secondary | ICD-10-CM | POA: Diagnosis not present

## 2017-07-25 DIAGNOSIS — Z794 Long term (current) use of insulin: Secondary | ICD-10-CM | POA: Diagnosis not present

## 2017-07-25 DIAGNOSIS — K430 Incisional hernia with obstruction, without gangrene: Secondary | ICD-10-CM | POA: Diagnosis not present

## 2017-07-25 DIAGNOSIS — Z23 Encounter for immunization: Secondary | ICD-10-CM | POA: Diagnosis not present

## 2017-07-25 DIAGNOSIS — E7800 Pure hypercholesterolemia, unspecified: Secondary | ICD-10-CM | POA: Diagnosis not present

## 2017-07-25 DIAGNOSIS — G4733 Obstructive sleep apnea (adult) (pediatric): Secondary | ICD-10-CM | POA: Diagnosis not present

## 2017-07-25 DIAGNOSIS — L719 Rosacea, unspecified: Secondary | ICD-10-CM | POA: Diagnosis not present

## 2017-07-25 DIAGNOSIS — E1143 Type 2 diabetes mellitus with diabetic autonomic (poly)neuropathy: Secondary | ICD-10-CM | POA: Diagnosis not present

## 2017-07-25 DIAGNOSIS — Z9119 Patient's noncompliance with other medical treatment and regimen: Secondary | ICD-10-CM | POA: Diagnosis not present

## 2017-07-25 DIAGNOSIS — E1142 Type 2 diabetes mellitus with diabetic polyneuropathy: Secondary | ICD-10-CM | POA: Diagnosis not present

## 2017-07-25 DIAGNOSIS — K3184 Gastroparesis: Secondary | ICD-10-CM | POA: Diagnosis not present

## 2017-07-25 DIAGNOSIS — K433 Parastomal hernia with obstruction, without gangrene: Secondary | ICD-10-CM | POA: Diagnosis not present

## 2017-07-25 DIAGNOSIS — E1065 Type 1 diabetes mellitus with hyperglycemia: Secondary | ICD-10-CM | POA: Diagnosis not present

## 2017-07-25 DIAGNOSIS — I10 Essential (primary) hypertension: Secondary | ICD-10-CM | POA: Diagnosis not present

## 2017-07-25 DIAGNOSIS — Z932 Ileostomy status: Secondary | ICD-10-CM | POA: Diagnosis not present

## 2017-07-25 DIAGNOSIS — E1043 Type 1 diabetes mellitus with diabetic autonomic (poly)neuropathy: Secondary | ICD-10-CM | POA: Diagnosis not present

## 2017-07-26 DIAGNOSIS — Z794 Long term (current) use of insulin: Secondary | ICD-10-CM | POA: Diagnosis not present

## 2017-07-26 DIAGNOSIS — Z23 Encounter for immunization: Secondary | ICD-10-CM | POA: Diagnosis not present

## 2017-07-26 DIAGNOSIS — Z981 Arthrodesis status: Secondary | ICD-10-CM | POA: Diagnosis not present

## 2017-07-26 DIAGNOSIS — G4733 Obstructive sleep apnea (adult) (pediatric): Secondary | ICD-10-CM | POA: Diagnosis not present

## 2017-07-26 DIAGNOSIS — I10 Essential (primary) hypertension: Secondary | ICD-10-CM | POA: Diagnosis not present

## 2017-07-26 DIAGNOSIS — Z9119 Patient's noncompliance with other medical treatment and regimen: Secondary | ICD-10-CM | POA: Diagnosis not present

## 2017-07-26 DIAGNOSIS — L719 Rosacea, unspecified: Secondary | ICD-10-CM | POA: Diagnosis not present

## 2017-07-26 DIAGNOSIS — K433 Parastomal hernia with obstruction, without gangrene: Secondary | ICD-10-CM | POA: Diagnosis not present

## 2017-07-26 DIAGNOSIS — E1142 Type 2 diabetes mellitus with diabetic polyneuropathy: Secondary | ICD-10-CM | POA: Diagnosis not present

## 2017-07-26 DIAGNOSIS — E1143 Type 2 diabetes mellitus with diabetic autonomic (poly)neuropathy: Secondary | ICD-10-CM | POA: Diagnosis not present

## 2017-07-26 DIAGNOSIS — Z932 Ileostomy status: Secondary | ICD-10-CM | POA: Diagnosis not present

## 2017-07-26 DIAGNOSIS — K3184 Gastroparesis: Secondary | ICD-10-CM | POA: Diagnosis not present

## 2017-07-26 DIAGNOSIS — E7800 Pure hypercholesterolemia, unspecified: Secondary | ICD-10-CM | POA: Diagnosis not present

## 2017-07-30 DIAGNOSIS — Z794 Long term (current) use of insulin: Secondary | ICD-10-CM | POA: Diagnosis not present

## 2017-07-30 DIAGNOSIS — Z602 Problems related to living alone: Secondary | ICD-10-CM | POA: Diagnosis not present

## 2017-07-30 DIAGNOSIS — Z7952 Long term (current) use of systemic steroids: Secondary | ICD-10-CM | POA: Diagnosis not present

## 2017-07-30 DIAGNOSIS — Z48815 Encounter for surgical aftercare following surgery on the digestive system: Secondary | ICD-10-CM | POA: Diagnosis not present

## 2017-07-30 DIAGNOSIS — J45909 Unspecified asthma, uncomplicated: Secondary | ICD-10-CM | POA: Diagnosis not present

## 2017-07-30 DIAGNOSIS — E1143 Type 2 diabetes mellitus with diabetic autonomic (poly)neuropathy: Secondary | ICD-10-CM | POA: Diagnosis not present

## 2017-07-30 DIAGNOSIS — Z433 Encounter for attention to colostomy: Secondary | ICD-10-CM | POA: Diagnosis not present

## 2017-07-30 DIAGNOSIS — I10 Essential (primary) hypertension: Secondary | ICD-10-CM | POA: Diagnosis not present

## 2017-07-30 DIAGNOSIS — K219 Gastro-esophageal reflux disease without esophagitis: Secondary | ICD-10-CM | POA: Diagnosis not present

## 2017-07-30 DIAGNOSIS — M1991 Primary osteoarthritis, unspecified site: Secondary | ICD-10-CM | POA: Diagnosis not present

## 2017-07-31 DIAGNOSIS — Z7952 Long term (current) use of systemic steroids: Secondary | ICD-10-CM | POA: Diagnosis not present

## 2017-07-31 DIAGNOSIS — I10 Essential (primary) hypertension: Secondary | ICD-10-CM | POA: Diagnosis not present

## 2017-07-31 DIAGNOSIS — Z433 Encounter for attention to colostomy: Secondary | ICD-10-CM | POA: Diagnosis not present

## 2017-07-31 DIAGNOSIS — K219 Gastro-esophageal reflux disease without esophagitis: Secondary | ICD-10-CM | POA: Diagnosis not present

## 2017-07-31 DIAGNOSIS — Z48815 Encounter for surgical aftercare following surgery on the digestive system: Secondary | ICD-10-CM | POA: Diagnosis not present

## 2017-07-31 DIAGNOSIS — M1991 Primary osteoarthritis, unspecified site: Secondary | ICD-10-CM | POA: Diagnosis not present

## 2017-07-31 DIAGNOSIS — E1143 Type 2 diabetes mellitus with diabetic autonomic (poly)neuropathy: Secondary | ICD-10-CM | POA: Diagnosis not present

## 2017-07-31 DIAGNOSIS — Z794 Long term (current) use of insulin: Secondary | ICD-10-CM | POA: Diagnosis not present

## 2017-07-31 DIAGNOSIS — Z602 Problems related to living alone: Secondary | ICD-10-CM | POA: Diagnosis not present

## 2017-07-31 DIAGNOSIS — J45909 Unspecified asthma, uncomplicated: Secondary | ICD-10-CM | POA: Diagnosis not present

## 2017-08-02 DIAGNOSIS — Z602 Problems related to living alone: Secondary | ICD-10-CM | POA: Diagnosis not present

## 2017-08-02 DIAGNOSIS — Z48815 Encounter for surgical aftercare following surgery on the digestive system: Secondary | ICD-10-CM | POA: Diagnosis not present

## 2017-08-02 DIAGNOSIS — Z433 Encounter for attention to colostomy: Secondary | ICD-10-CM | POA: Diagnosis not present

## 2017-08-02 DIAGNOSIS — I10 Essential (primary) hypertension: Secondary | ICD-10-CM | POA: Diagnosis not present

## 2017-08-02 DIAGNOSIS — J45909 Unspecified asthma, uncomplicated: Secondary | ICD-10-CM | POA: Diagnosis not present

## 2017-08-02 DIAGNOSIS — Z7952 Long term (current) use of systemic steroids: Secondary | ICD-10-CM | POA: Diagnosis not present

## 2017-08-02 DIAGNOSIS — K219 Gastro-esophageal reflux disease without esophagitis: Secondary | ICD-10-CM | POA: Diagnosis not present

## 2017-08-02 DIAGNOSIS — Z794 Long term (current) use of insulin: Secondary | ICD-10-CM | POA: Diagnosis not present

## 2017-08-02 DIAGNOSIS — E1143 Type 2 diabetes mellitus with diabetic autonomic (poly)neuropathy: Secondary | ICD-10-CM | POA: Diagnosis not present

## 2017-08-02 DIAGNOSIS — M1991 Primary osteoarthritis, unspecified site: Secondary | ICD-10-CM | POA: Diagnosis not present

## 2017-08-04 DIAGNOSIS — Z7952 Long term (current) use of systemic steroids: Secondary | ICD-10-CM | POA: Diagnosis not present

## 2017-08-04 DIAGNOSIS — E1143 Type 2 diabetes mellitus with diabetic autonomic (poly)neuropathy: Secondary | ICD-10-CM | POA: Diagnosis not present

## 2017-08-04 DIAGNOSIS — K219 Gastro-esophageal reflux disease without esophagitis: Secondary | ICD-10-CM | POA: Diagnosis not present

## 2017-08-04 DIAGNOSIS — Z433 Encounter for attention to colostomy: Secondary | ICD-10-CM | POA: Diagnosis not present

## 2017-08-04 DIAGNOSIS — J45909 Unspecified asthma, uncomplicated: Secondary | ICD-10-CM | POA: Diagnosis not present

## 2017-08-04 DIAGNOSIS — Z48815 Encounter for surgical aftercare following surgery on the digestive system: Secondary | ICD-10-CM | POA: Diagnosis not present

## 2017-08-04 DIAGNOSIS — Z794 Long term (current) use of insulin: Secondary | ICD-10-CM | POA: Diagnosis not present

## 2017-08-04 DIAGNOSIS — I10 Essential (primary) hypertension: Secondary | ICD-10-CM | POA: Diagnosis not present

## 2017-08-04 DIAGNOSIS — M1991 Primary osteoarthritis, unspecified site: Secondary | ICD-10-CM | POA: Diagnosis not present

## 2017-08-04 DIAGNOSIS — Z602 Problems related to living alone: Secondary | ICD-10-CM | POA: Diagnosis not present

## 2017-08-05 DIAGNOSIS — I10 Essential (primary) hypertension: Secondary | ICD-10-CM | POA: Diagnosis not present

## 2017-08-05 DIAGNOSIS — Z794 Long term (current) use of insulin: Secondary | ICD-10-CM | POA: Diagnosis not present

## 2017-08-05 DIAGNOSIS — J45909 Unspecified asthma, uncomplicated: Secondary | ICD-10-CM | POA: Diagnosis not present

## 2017-08-05 DIAGNOSIS — K219 Gastro-esophageal reflux disease without esophagitis: Secondary | ICD-10-CM | POA: Diagnosis not present

## 2017-08-05 DIAGNOSIS — Z602 Problems related to living alone: Secondary | ICD-10-CM | POA: Diagnosis not present

## 2017-08-05 DIAGNOSIS — Z48815 Encounter for surgical aftercare following surgery on the digestive system: Secondary | ICD-10-CM | POA: Diagnosis not present

## 2017-08-05 DIAGNOSIS — E1143 Type 2 diabetes mellitus with diabetic autonomic (poly)neuropathy: Secondary | ICD-10-CM | POA: Diagnosis not present

## 2017-08-05 DIAGNOSIS — Z7952 Long term (current) use of systemic steroids: Secondary | ICD-10-CM | POA: Diagnosis not present

## 2017-08-05 DIAGNOSIS — M1991 Primary osteoarthritis, unspecified site: Secondary | ICD-10-CM | POA: Diagnosis not present

## 2017-08-05 DIAGNOSIS — Z433 Encounter for attention to colostomy: Secondary | ICD-10-CM | POA: Diagnosis not present

## 2017-08-08 DIAGNOSIS — K219 Gastro-esophageal reflux disease without esophagitis: Secondary | ICD-10-CM | POA: Diagnosis not present

## 2017-08-08 DIAGNOSIS — M1991 Primary osteoarthritis, unspecified site: Secondary | ICD-10-CM | POA: Diagnosis not present

## 2017-08-08 DIAGNOSIS — J45909 Unspecified asthma, uncomplicated: Secondary | ICD-10-CM | POA: Diagnosis not present

## 2017-08-08 DIAGNOSIS — Z7952 Long term (current) use of systemic steroids: Secondary | ICD-10-CM | POA: Diagnosis not present

## 2017-08-08 DIAGNOSIS — I10 Essential (primary) hypertension: Secondary | ICD-10-CM | POA: Diagnosis not present

## 2017-08-08 DIAGNOSIS — Z48815 Encounter for surgical aftercare following surgery on the digestive system: Secondary | ICD-10-CM | POA: Diagnosis not present

## 2017-08-08 DIAGNOSIS — Z794 Long term (current) use of insulin: Secondary | ICD-10-CM | POA: Diagnosis not present

## 2017-08-08 DIAGNOSIS — Z602 Problems related to living alone: Secondary | ICD-10-CM | POA: Diagnosis not present

## 2017-08-08 DIAGNOSIS — Z433 Encounter for attention to colostomy: Secondary | ICD-10-CM | POA: Diagnosis not present

## 2017-08-08 DIAGNOSIS — E1143 Type 2 diabetes mellitus with diabetic autonomic (poly)neuropathy: Secondary | ICD-10-CM | POA: Diagnosis not present

## 2017-08-09 DIAGNOSIS — M1991 Primary osteoarthritis, unspecified site: Secondary | ICD-10-CM | POA: Diagnosis not present

## 2017-08-09 DIAGNOSIS — Z433 Encounter for attention to colostomy: Secondary | ICD-10-CM | POA: Diagnosis not present

## 2017-08-09 DIAGNOSIS — Z48815 Encounter for surgical aftercare following surgery on the digestive system: Secondary | ICD-10-CM | POA: Diagnosis not present

## 2017-08-09 DIAGNOSIS — I10 Essential (primary) hypertension: Secondary | ICD-10-CM | POA: Diagnosis not present

## 2017-08-09 DIAGNOSIS — E1143 Type 2 diabetes mellitus with diabetic autonomic (poly)neuropathy: Secondary | ICD-10-CM | POA: Diagnosis not present

## 2017-08-09 DIAGNOSIS — J45909 Unspecified asthma, uncomplicated: Secondary | ICD-10-CM | POA: Diagnosis not present

## 2017-08-09 DIAGNOSIS — K219 Gastro-esophageal reflux disease without esophagitis: Secondary | ICD-10-CM | POA: Diagnosis not present

## 2017-08-09 DIAGNOSIS — Z602 Problems related to living alone: Secondary | ICD-10-CM | POA: Diagnosis not present

## 2017-08-09 DIAGNOSIS — Z794 Long term (current) use of insulin: Secondary | ICD-10-CM | POA: Diagnosis not present

## 2017-08-09 DIAGNOSIS — Z7952 Long term (current) use of systemic steroids: Secondary | ICD-10-CM | POA: Diagnosis not present

## 2017-08-09 DIAGNOSIS — Z933 Colostomy status: Secondary | ICD-10-CM | POA: Diagnosis not present

## 2017-08-10 DIAGNOSIS — E1143 Type 2 diabetes mellitus with diabetic autonomic (poly)neuropathy: Secondary | ICD-10-CM | POA: Diagnosis not present

## 2017-08-10 DIAGNOSIS — Z433 Encounter for attention to colostomy: Secondary | ICD-10-CM | POA: Diagnosis not present

## 2017-08-10 DIAGNOSIS — J45909 Unspecified asthma, uncomplicated: Secondary | ICD-10-CM | POA: Diagnosis not present

## 2017-08-10 DIAGNOSIS — K219 Gastro-esophageal reflux disease without esophagitis: Secondary | ICD-10-CM | POA: Diagnosis not present

## 2017-08-10 DIAGNOSIS — M1991 Primary osteoarthritis, unspecified site: Secondary | ICD-10-CM | POA: Diagnosis not present

## 2017-08-10 DIAGNOSIS — Z7952 Long term (current) use of systemic steroids: Secondary | ICD-10-CM | POA: Diagnosis not present

## 2017-08-10 DIAGNOSIS — Z602 Problems related to living alone: Secondary | ICD-10-CM | POA: Diagnosis not present

## 2017-08-10 DIAGNOSIS — Z794 Long term (current) use of insulin: Secondary | ICD-10-CM | POA: Diagnosis not present

## 2017-08-10 DIAGNOSIS — Z48815 Encounter for surgical aftercare following surgery on the digestive system: Secondary | ICD-10-CM | POA: Diagnosis not present

## 2017-08-10 DIAGNOSIS — I10 Essential (primary) hypertension: Secondary | ICD-10-CM | POA: Diagnosis not present

## 2017-08-11 DIAGNOSIS — Z794 Long term (current) use of insulin: Secondary | ICD-10-CM | POA: Diagnosis not present

## 2017-08-11 DIAGNOSIS — K219 Gastro-esophageal reflux disease without esophagitis: Secondary | ICD-10-CM | POA: Diagnosis not present

## 2017-08-11 DIAGNOSIS — M1991 Primary osteoarthritis, unspecified site: Secondary | ICD-10-CM | POA: Diagnosis not present

## 2017-08-11 DIAGNOSIS — Z7952 Long term (current) use of systemic steroids: Secondary | ICD-10-CM | POA: Diagnosis not present

## 2017-08-11 DIAGNOSIS — J45909 Unspecified asthma, uncomplicated: Secondary | ICD-10-CM | POA: Diagnosis not present

## 2017-08-11 DIAGNOSIS — I10 Essential (primary) hypertension: Secondary | ICD-10-CM | POA: Diagnosis not present

## 2017-08-11 DIAGNOSIS — Z602 Problems related to living alone: Secondary | ICD-10-CM | POA: Diagnosis not present

## 2017-08-11 DIAGNOSIS — E1143 Type 2 diabetes mellitus with diabetic autonomic (poly)neuropathy: Secondary | ICD-10-CM | POA: Diagnosis not present

## 2017-08-11 DIAGNOSIS — Z433 Encounter for attention to colostomy: Secondary | ICD-10-CM | POA: Diagnosis not present

## 2017-08-11 DIAGNOSIS — Z48815 Encounter for surgical aftercare following surgery on the digestive system: Secondary | ICD-10-CM | POA: Diagnosis not present

## 2017-08-12 DIAGNOSIS — I10 Essential (primary) hypertension: Secondary | ICD-10-CM | POA: Diagnosis not present

## 2017-08-12 DIAGNOSIS — Z433 Encounter for attention to colostomy: Secondary | ICD-10-CM | POA: Diagnosis not present

## 2017-08-12 DIAGNOSIS — Z48815 Encounter for surgical aftercare following surgery on the digestive system: Secondary | ICD-10-CM | POA: Diagnosis not present

## 2017-08-12 DIAGNOSIS — Z794 Long term (current) use of insulin: Secondary | ICD-10-CM | POA: Diagnosis not present

## 2017-08-12 DIAGNOSIS — M1991 Primary osteoarthritis, unspecified site: Secondary | ICD-10-CM | POA: Diagnosis not present

## 2017-08-12 DIAGNOSIS — K219 Gastro-esophageal reflux disease without esophagitis: Secondary | ICD-10-CM | POA: Diagnosis not present

## 2017-08-12 DIAGNOSIS — J45909 Unspecified asthma, uncomplicated: Secondary | ICD-10-CM | POA: Diagnosis not present

## 2017-08-12 DIAGNOSIS — Z602 Problems related to living alone: Secondary | ICD-10-CM | POA: Diagnosis not present

## 2017-08-12 DIAGNOSIS — E1143 Type 2 diabetes mellitus with diabetic autonomic (poly)neuropathy: Secondary | ICD-10-CM | POA: Diagnosis not present

## 2017-08-12 DIAGNOSIS — Z7952 Long term (current) use of systemic steroids: Secondary | ICD-10-CM | POA: Diagnosis not present

## 2017-08-15 DIAGNOSIS — M1991 Primary osteoarthritis, unspecified site: Secondary | ICD-10-CM | POA: Diagnosis not present

## 2017-08-15 DIAGNOSIS — Z794 Long term (current) use of insulin: Secondary | ICD-10-CM | POA: Diagnosis not present

## 2017-08-15 DIAGNOSIS — I10 Essential (primary) hypertension: Secondary | ICD-10-CM | POA: Diagnosis not present

## 2017-08-15 DIAGNOSIS — K219 Gastro-esophageal reflux disease without esophagitis: Secondary | ICD-10-CM | POA: Diagnosis not present

## 2017-08-15 DIAGNOSIS — Z433 Encounter for attention to colostomy: Secondary | ICD-10-CM | POA: Diagnosis not present

## 2017-08-15 DIAGNOSIS — E1143 Type 2 diabetes mellitus with diabetic autonomic (poly)neuropathy: Secondary | ICD-10-CM | POA: Diagnosis not present

## 2017-08-15 DIAGNOSIS — J45909 Unspecified asthma, uncomplicated: Secondary | ICD-10-CM | POA: Diagnosis not present

## 2017-08-15 DIAGNOSIS — Z602 Problems related to living alone: Secondary | ICD-10-CM | POA: Diagnosis not present

## 2017-08-15 DIAGNOSIS — Z48815 Encounter for surgical aftercare following surgery on the digestive system: Secondary | ICD-10-CM | POA: Diagnosis not present

## 2017-08-15 DIAGNOSIS — Z7952 Long term (current) use of systemic steroids: Secondary | ICD-10-CM | POA: Diagnosis not present

## 2017-08-16 DIAGNOSIS — K219 Gastro-esophageal reflux disease without esophagitis: Secondary | ICD-10-CM | POA: Diagnosis not present

## 2017-08-16 DIAGNOSIS — Z433 Encounter for attention to colostomy: Secondary | ICD-10-CM | POA: Diagnosis not present

## 2017-08-16 DIAGNOSIS — J45909 Unspecified asthma, uncomplicated: Secondary | ICD-10-CM | POA: Diagnosis not present

## 2017-08-16 DIAGNOSIS — Z48815 Encounter for surgical aftercare following surgery on the digestive system: Secondary | ICD-10-CM | POA: Diagnosis not present

## 2017-08-16 DIAGNOSIS — M1991 Primary osteoarthritis, unspecified site: Secondary | ICD-10-CM | POA: Diagnosis not present

## 2017-08-16 DIAGNOSIS — Z7952 Long term (current) use of systemic steroids: Secondary | ICD-10-CM | POA: Diagnosis not present

## 2017-08-16 DIAGNOSIS — Z794 Long term (current) use of insulin: Secondary | ICD-10-CM | POA: Diagnosis not present

## 2017-08-16 DIAGNOSIS — E1143 Type 2 diabetes mellitus with diabetic autonomic (poly)neuropathy: Secondary | ICD-10-CM | POA: Diagnosis not present

## 2017-08-16 DIAGNOSIS — I10 Essential (primary) hypertension: Secondary | ICD-10-CM | POA: Diagnosis not present

## 2017-08-16 DIAGNOSIS — Z602 Problems related to living alone: Secondary | ICD-10-CM | POA: Diagnosis not present

## 2017-08-19 DIAGNOSIS — K219 Gastro-esophageal reflux disease without esophagitis: Secondary | ICD-10-CM | POA: Diagnosis not present

## 2017-08-19 DIAGNOSIS — J45909 Unspecified asthma, uncomplicated: Secondary | ICD-10-CM | POA: Diagnosis not present

## 2017-08-19 DIAGNOSIS — Z7952 Long term (current) use of systemic steroids: Secondary | ICD-10-CM | POA: Diagnosis not present

## 2017-08-19 DIAGNOSIS — M1991 Primary osteoarthritis, unspecified site: Secondary | ICD-10-CM | POA: Diagnosis not present

## 2017-08-19 DIAGNOSIS — Z433 Encounter for attention to colostomy: Secondary | ICD-10-CM | POA: Diagnosis not present

## 2017-08-19 DIAGNOSIS — Z48815 Encounter for surgical aftercare following surgery on the digestive system: Secondary | ICD-10-CM | POA: Diagnosis not present

## 2017-08-19 DIAGNOSIS — E1143 Type 2 diabetes mellitus with diabetic autonomic (poly)neuropathy: Secondary | ICD-10-CM | POA: Diagnosis not present

## 2017-08-19 DIAGNOSIS — I10 Essential (primary) hypertension: Secondary | ICD-10-CM | POA: Diagnosis not present

## 2017-08-19 DIAGNOSIS — Z602 Problems related to living alone: Secondary | ICD-10-CM | POA: Diagnosis not present

## 2017-08-19 DIAGNOSIS — Z794 Long term (current) use of insulin: Secondary | ICD-10-CM | POA: Diagnosis not present

## 2017-08-22 DIAGNOSIS — Z7952 Long term (current) use of systemic steroids: Secondary | ICD-10-CM | POA: Diagnosis not present

## 2017-08-22 DIAGNOSIS — Z48815 Encounter for surgical aftercare following surgery on the digestive system: Secondary | ICD-10-CM | POA: Diagnosis not present

## 2017-08-22 DIAGNOSIS — Z433 Encounter for attention to colostomy: Secondary | ICD-10-CM | POA: Diagnosis not present

## 2017-08-22 DIAGNOSIS — I10 Essential (primary) hypertension: Secondary | ICD-10-CM | POA: Diagnosis not present

## 2017-08-22 DIAGNOSIS — Z602 Problems related to living alone: Secondary | ICD-10-CM | POA: Diagnosis not present

## 2017-08-22 DIAGNOSIS — Z794 Long term (current) use of insulin: Secondary | ICD-10-CM | POA: Diagnosis not present

## 2017-08-22 DIAGNOSIS — J45909 Unspecified asthma, uncomplicated: Secondary | ICD-10-CM | POA: Diagnosis not present

## 2017-08-22 DIAGNOSIS — K219 Gastro-esophageal reflux disease without esophagitis: Secondary | ICD-10-CM | POA: Diagnosis not present

## 2017-08-22 DIAGNOSIS — M1991 Primary osteoarthritis, unspecified site: Secondary | ICD-10-CM | POA: Diagnosis not present

## 2017-08-22 DIAGNOSIS — E1143 Type 2 diabetes mellitus with diabetic autonomic (poly)neuropathy: Secondary | ICD-10-CM | POA: Diagnosis not present

## 2017-08-25 DIAGNOSIS — Z48815 Encounter for surgical aftercare following surgery on the digestive system: Secondary | ICD-10-CM | POA: Diagnosis not present

## 2017-08-25 DIAGNOSIS — Z602 Problems related to living alone: Secondary | ICD-10-CM | POA: Diagnosis not present

## 2017-08-25 DIAGNOSIS — I10 Essential (primary) hypertension: Secondary | ICD-10-CM | POA: Diagnosis not present

## 2017-08-25 DIAGNOSIS — Z7952 Long term (current) use of systemic steroids: Secondary | ICD-10-CM | POA: Diagnosis not present

## 2017-08-25 DIAGNOSIS — Z433 Encounter for attention to colostomy: Secondary | ICD-10-CM | POA: Diagnosis not present

## 2017-08-25 DIAGNOSIS — Z794 Long term (current) use of insulin: Secondary | ICD-10-CM | POA: Diagnosis not present

## 2017-08-25 DIAGNOSIS — J45909 Unspecified asthma, uncomplicated: Secondary | ICD-10-CM | POA: Diagnosis not present

## 2017-08-25 DIAGNOSIS — M1991 Primary osteoarthritis, unspecified site: Secondary | ICD-10-CM | POA: Diagnosis not present

## 2017-08-25 DIAGNOSIS — E1143 Type 2 diabetes mellitus with diabetic autonomic (poly)neuropathy: Secondary | ICD-10-CM | POA: Diagnosis not present

## 2017-08-25 DIAGNOSIS — K219 Gastro-esophageal reflux disease without esophagitis: Secondary | ICD-10-CM | POA: Diagnosis not present

## 2017-08-30 DIAGNOSIS — E7800 Pure hypercholesterolemia, unspecified: Secondary | ICD-10-CM | POA: Diagnosis not present

## 2017-08-30 DIAGNOSIS — L84 Corns and callosities: Secondary | ICD-10-CM | POA: Diagnosis not present

## 2017-08-30 DIAGNOSIS — I10 Essential (primary) hypertension: Secondary | ICD-10-CM | POA: Diagnosis not present

## 2017-08-30 DIAGNOSIS — Z1331 Encounter for screening for depression: Secondary | ICD-10-CM | POA: Diagnosis not present

## 2017-08-30 DIAGNOSIS — E1140 Type 2 diabetes mellitus with diabetic neuropathy, unspecified: Secondary | ICD-10-CM | POA: Diagnosis not present

## 2017-09-01 DIAGNOSIS — J45909 Unspecified asthma, uncomplicated: Secondary | ICD-10-CM | POA: Diagnosis not present

## 2017-09-01 DIAGNOSIS — Z48815 Encounter for surgical aftercare following surgery on the digestive system: Secondary | ICD-10-CM | POA: Diagnosis not present

## 2017-09-01 DIAGNOSIS — E1143 Type 2 diabetes mellitus with diabetic autonomic (poly)neuropathy: Secondary | ICD-10-CM | POA: Diagnosis not present

## 2017-09-01 DIAGNOSIS — Z602 Problems related to living alone: Secondary | ICD-10-CM | POA: Diagnosis not present

## 2017-09-01 DIAGNOSIS — Z7952 Long term (current) use of systemic steroids: Secondary | ICD-10-CM | POA: Diagnosis not present

## 2017-09-01 DIAGNOSIS — K219 Gastro-esophageal reflux disease without esophagitis: Secondary | ICD-10-CM | POA: Diagnosis not present

## 2017-09-01 DIAGNOSIS — I10 Essential (primary) hypertension: Secondary | ICD-10-CM | POA: Diagnosis not present

## 2017-09-01 DIAGNOSIS — M1991 Primary osteoarthritis, unspecified site: Secondary | ICD-10-CM | POA: Diagnosis not present

## 2017-09-01 DIAGNOSIS — Z433 Encounter for attention to colostomy: Secondary | ICD-10-CM | POA: Diagnosis not present

## 2017-09-01 DIAGNOSIS — Z794 Long term (current) use of insulin: Secondary | ICD-10-CM | POA: Diagnosis not present

## 2017-10-11 ENCOUNTER — Other Ambulatory Visit: Payer: Self-pay

## 2017-10-11 DIAGNOSIS — E1042 Type 1 diabetes mellitus with diabetic polyneuropathy: Secondary | ICD-10-CM | POA: Diagnosis not present

## 2017-10-11 DIAGNOSIS — G43909 Migraine, unspecified, not intractable, without status migrainosus: Secondary | ICD-10-CM | POA: Diagnosis not present

## 2017-10-11 MED ORDER — INSULIN PEN NEEDLE 31G X 8 MM MISC: 3 refills | Status: DC

## 2017-10-14 ENCOUNTER — Telehealth: Payer: Self-pay | Admitting: Internal Medicine

## 2017-10-14 MED ORDER — GLUCOSE BLOOD VI STRP: ORAL_STRIP | 11 refills | Status: DC

## 2017-10-14 NOTE — Telephone Encounter (Signed)
Called pt and informed Rx sent.  

## 2017-10-14 NOTE — Telephone Encounter (Signed)
Patient need test strips sent to her pharmacy Pharmacy:  [id number] - [place], [place] - [address] DEA #:  [id number]

## 2017-11-07 ENCOUNTER — Telehealth: Payer: Self-pay | Admitting: Internal Medicine

## 2017-11-07 DIAGNOSIS — Z933 Colostomy status: Secondary | ICD-10-CM | POA: Diagnosis not present

## 2017-11-07 NOTE — Telephone Encounter (Signed)
[facility] needs verification that Dr. Elvera LennoxGherghe has received Request for Certificate of Medical Necessity and Chart Notes. [phone]

## 2017-11-09 NOTE — Telephone Encounter (Signed)
[facility], [facility]

## 2017-11-24 ENCOUNTER — Encounter: Payer: Self-pay | Admitting: Internal Medicine

## 2017-11-24 ENCOUNTER — Ambulatory Visit (INDEPENDENT_AMBULATORY_CARE_PROVIDER_SITE_OTHER): Payer: Medicare Other | Admitting: Internal Medicine

## 2017-11-24 VITALS — BP 118/64 | HR 103 | Ht 68.5 in | Wt 304.6 lb

## 2017-11-24 DIAGNOSIS — E1142 Type 2 diabetes mellitus with diabetic polyneuropathy: Secondary | ICD-10-CM

## 2017-11-24 DIAGNOSIS — R1012 Left upper quadrant pain: Secondary | ICD-10-CM | POA: Diagnosis not present

## 2017-11-24 DIAGNOSIS — E559 Vitamin D deficiency, unspecified: Secondary | ICD-10-CM | POA: Diagnosis not present

## 2017-11-24 DIAGNOSIS — E7849 Other hyperlipidemia: Secondary | ICD-10-CM

## 2017-11-24 DIAGNOSIS — E1165 Type 2 diabetes mellitus with hyperglycemia: Secondary | ICD-10-CM | POA: Diagnosis not present

## 2017-11-24 DIAGNOSIS — IMO0002 Reserved for concepts with insufficient information to code with codable children: Secondary | ICD-10-CM

## 2017-11-24 DIAGNOSIS — K435 Parastomal hernia without obstruction or  gangrene: Secondary | ICD-10-CM | POA: Diagnosis not present

## 2017-11-24 LAB — POCT GLYCOSYLATED HEMOGLOBIN (HGB A1C): Hemoglobin A1C: 8.2

## 2017-11-24 MED ORDER — "INSULIN SYRINGE-NEEDLE U-100 31G X 5/16"" 0.3 ML MISC": 5 refills | Status: DC

## 2017-11-24 MED ORDER — GLUCOSE BLOOD VI STRP: ORAL_STRIP | 3 refills | Status: DC

## 2017-11-24 MED ORDER — ACCU-CHEK FASTCLIX LANCETS MISC: 3 refills | Status: DC

## 2017-11-24 NOTE — Progress Notes (Signed)
Patient ID: Gavin Smith, male   DOB: 06-27-1976, 42 y.o.   MRN: [id number]  HPI: Gavin Smith is a 42 y.o.-year-old male, initially referred by her PCP, Dr. Theador Hawthorne, for management of DM2, dx in 35 (42 y/o), insulin-dependent, uncontrolled, wit complications (PN). Last visit 5.5 months ago. Now seeing Dr. Junious Dresser. She is here with her service dog, Merrell.  DM2: Reviewed history: She has a h/o autonomic neuropathy of the GI tract (dx in 1986, when she was 42 y/o - not related to DM - severe diarrhea: "food goes through me"), [place]. [place] She now has a permanent colostomy - had sx in 09/2014. She started to feel much better after the sx.  Last hemoglobin A1c was: Lab Results  Component Value Date   HGBA1C 12 07/15/2017   HGBA1C 8.9 11/10/2016   HGBA1C 7.8 (H) 07/22/2016  11/10/2016:The HbA1c calculated from the fructosamine was 7.3%, better than the measured HbA1c! 02/28/2015: HbA1c 6.9% 10/21/2014: HbA1c 6.9% - after changing from Humalog to NovoLog  07/23/2014: HbA1c 10.9% ICA Abs negative (01/31/2012) C-pp 5.0, Glu 218 (01/25/2012)  She had high HbA1c levels in the past as her insurance did not approve NovoLog and was forced to change to Humalog/Humulin. Since then,we need to send a preauthorization to her insurance for NovoLog every year.  She is on: - Levemir 22 units twice a day >> went back to daily dosing ~ 8 PM: 38-44 units at night - NovoLog:  - ICR 1:4 - but 1:2 for potatoes and rice  - target: 150  - ISF: 150-200: 4 units, 200-230: 8 units, 230-and higher: 10 units She cannot digest pills. She tried oral meds in the past >> "they went right through me". She also tried Lantus >> this did not work for her >> sugars unchanged despite increasing the dose.  She uses an Camera operator, but she does not like this is the battery does not hold long enough  Pt checks her sugars 4x times a day-sugars are better, still above target: - Fasting: 317,  401 >> 161 >> 160-190 >> 130s in last week >> 137-188, 262 (~11-1 pm)  - 2h after b'fast: 160-262, 290 >> 183-263 >> n/c  - before lunch: 127-175 >> 383 >> 132 >> n/c (skips lunch) >> 165-222 - 2h after lunch: 173-272 >> 204-405 >> 76 >> n/c - before dinner: 311-493 >> 210, 319 >> 200s >> 160-170 >> 173-277 - 2h after dinner:  292-391 >> 130, 174-310 >> n/c - bedtime:  66 x1, 106-196 >> 147-200 >> 120-130s, prev. 200-300 >> 116-213, 312 (~ 1-2 am) - nighttime: 73-110 (takes insulin if high after dinner) >> 69-217, 285 >> 61, 74-153, 193 + recent lows - 43 x1 - dog woke her up >> 66 >> 49 >> 61. She has hypoglycemia awareness in the 60s.  Highest sugars recently 400s >> 300s >> 312.  - No CKD: Lab Results  Component Value Date   BUN 16 07/15/2017   Lab Results  Component Value Date   CREATININE 0.81 07/15/2017   02/03/2016: 13/0.93, Glu 183 07/02/2015: CMP: Normal, except glucose 216, BUN/creatinine 10/0.81, GFR 93, ALT 39 (0-32), bilirubin 1.3 (0-1.2) 07/23/2014: BUN/creatinine 12/0.9 Lab Results  Component Value Date   MICRALBCREAT 0.9 07/15/2017   MICRALBCREAT 0.5 08/12/2015  On lisinopril.  - + HL; last set of lipids: Lab Results  Component Value Date   CHOL 210 (H) 07/15/2017   HDL 46.10 07/15/2017   LDLDIRECT 119.0  07/15/2017   TRIG 372.0 (H) 07/15/2017   CHOLHDL 5 07/15/2017  02/03/2016: 205/284/55/93 07/02/2015: 206/229/63/97  07/23/2014: 324/868/42/171 AST/ALT: 121/76 She was taken off statins due to hemochromatosis.She has muscle aches from fenofibrate. She is on omega-3 fatty acids now.  - last eye exam was in 04/2016: No [facility]).  - She continues to have numbness and tingling in her feet.  She sees a podiatrist.  Of Cymbalta but on Neurontin cream.  Vitamin D def.:  Reviewed vitamin D levels: Lab Results  Component Value Date   VD25OH 31.25 07/15/2017   VD25OH 45.42 11/10/2016   VD25OH 35.30 05/31/2016   VD25OH 23.86 (L) 08/12/2015   VD25OH  19.32 (L) 11/28/2014  Vit D 23.5 in 02/03/2016 - on Ergocalciferol 50,000 IU daily. Vit D 13.9 in 06/2014 - despite being on Ergocalciferol 50,000 IU 2x a week. Vit D 19.3 in 11/2014 - repeat, on Ergocalciferol 50,000 IU 2x a week >> dose increased to 4 times a week Vit D 23.86 in 07/2015 - on ergocalciferol 50,000 IU 4x a week >> dose increased to daily   She continues on ergocalciferol 50,000 units daily.  Latest TSH: Lab Results  Component Value Date   TSH 0.762 07/23/2016   She also has a history of hemochromatosis, allergy-triggered asthma. Also, HTN, OSA -on CPAP, transaminitis, GERD, depression. She is on Depo-Provera.  ROS: Constitutional: no weight gain/no weight loss, + fatigue, no subjective hyperthermia, no subjective hypothermia Eyes: + blurry vision, no xerophthalmia ENT: no sore throat, no nodules palpated in throat, no dysphagia, no odynophagia, no hoarseness Cardiovascular: no CP/no SOB/no palpitations/no leg swelling Respiratory: no cough/no SOB/no wheezing Gastrointestinal: no N/no V/no D/+ C/no acid reflux/+ LUQ AP Musculoskeletal: no muscle aches/no joint aches Skin: no rashes, no hair loss Neurological: no tremors/+ numbness/+ tingling/no dizziness  I reviewed pt's medications, allergies, PMH, social hx, family hx, and changes were documented in the history of present illness. Otherwise, unchanged from my initial visit note.  Past Medical History:  Diagnosis Date  . Allergic rhinitis, cause unspecified   . Essential hypertension, benign   . Extrinsic asthma, unspecified   . Generalized osteoarthrosis, involving multiple sites   . Insomnia, unspecified   . Migraine, unspecified, without mention of intractable migraine without mention of status migrainosus   . Pityriasis rosea   . Pure hypercholesterolemia   . Type I (juvenile type) diabetes mellitus without mention of complication, not stated as uncontrolled   . Unspecified sleep apnea    Past Surgical  History:  Procedure Laterality Date  . BREAST REDUCTION SURGERY  2005  . CHOLECYSTECTOMY  2000  . RHINOPLASTY  2005  . SPINAL FUSION  2006   L5-S1 spinal fusion   History   Social History  . Marital Status: Single    Spouse Name: N/A  . Number of Children: 0  . Years of Education: 14   Occupational History  . Prev. EMS, now disabled   Social History Main Topics  . Smoking status: Never Smoker   . Smokeless tobacco: Not on file  . Alcohol Use: No  . Drug Use: No   Current Outpatient Medications on File Prior to Visit  Medication Sig Dispense Refill  . ACCU-CHEK FASTCLIX LANCETS MISC USE TO CHECK BLOOD SUGAR THREE TIMES A DAY 102 each 4  . albuterol (PROAIR HFA) 108 (90 BASE) MCG/ACT inhaler Inhale 1 puff into the lungs every 4 (four) hours as needed for wheezing.     Marland Kitchen albuterol (PROVENTIL) (2.5  MG/3ML) 0.083% nebulizer solution Take 2.5 mg by nebulization every 6 (six) hours as needed for wheezing.     Marland Kitchen atorvastatin (LIPITOR) 10 MG tablet Take 10 mg by mouth daily.   0  . CALCIUM PO Take 1 tablet by mouth daily.    . DULoxetine (CYMBALTA) 30 MG capsule Take 30 mg by mouth.    . fenofibrate (TRICOR) 145 MG tablet Take 1 tablet (145 mg total) by mouth daily. 90 tablet 3  . glucose blood (ACCU-CHEK AVIVA PLUS) test strip USE TO CHECK BLOOD SUGAR FIVE TIMES A DAY AS DIRECTED 150 each 11  . insulin aspart (NOVOLOG FLEXPEN) 100 UNIT/ML FlexPen 0-50 units 4 times daily by sliding scale. Max daily dose 200 units. 60 pen 0  . Insulin Pen Needle (B-D ULTRAFINE III SHORT PEN) 31G X 8 MM MISC USE FOR 5 INJECTIONS DAILY 100 each 3  . Insulin Syringe-Needle U-100 31G X 5/16" 0.3 ML MISC Use to inject insulin 3 times daily as directed. 100 each 5  . LEVEMIR FLEXTOUCH 100 UNIT/ML Pen inject 42 units subcutaneously every morning 15 mL 2  . lisinopril (PRINIVIL,ZESTRIL) 10 MG tablet Take 10 mg by mouth daily.      Marland Kitchen loratadine (CLARITIN) 10 MG tablet Take 10 mg by mouth daily.      . Multiple  Vitamin (MULTI-DAY PO) Take 1 tablet by mouth daily.      Marland Kitchen PULMICORT 0.5 MG/2ML nebulizer solution Take 2 mLs by nebulization daily as needed for wheezing.    . rizatriptan (MAXALT-MLT) 10 MG disintegrating tablet Take at onset of headache, May repeat in 2 hours if needed but no more than 2 in 24 hours    . tiZANidine (ZANAFLEX) 2 MG tablet Take 2 mg by mouth every 8 (eight) hours as needed for muscle spasms.     . traMADol (ULTRAM) 50 MG tablet Take 100 mg by mouth at bedtime.     . vitamin C (ASCORBIC ACID) 500 MG tablet Take 500 mg by mouth daily.      . Vitamin D, Ergocalciferol, (DRISDOL) 50000 units CAPS capsule Take 1 capsule (50,000 Units total) by mouth daily. 30 capsule 3   No current facility-administered medications on file prior to visit.    Allergies  Allergen Reactions  . Fenofibrate Other (See Comments)    Severe leg pain  . Humalog [Insulin Lispro] Other (See Comments)    Drug tolerance >> ineffectiveness! Pt needs NovoLog instead!  Marland Kitchen Morphine And Related   . Penicillins    Family History  Problem Relation Age of Onset  . Diabetes Mother   . Hypertension Mother   . Hyperlipidemia Mother   . Hypertension Father   . Hyperlipidemia Father   . Diabetes Maternal Grandmother   . Cancer Maternal Grandmother        Breast Cancer  . Hypertension Maternal Grandfather   . Heart disease Maternal Grandfather   . Heart disease Paternal Grandfather   . Cancer Other        Ovarian Cancer-Grandparent   PE: BP 118/64   Pulse (!) 103   Ht 5' 8.5" (1.74 m)   Wt (!) 304 lb 9.6 oz (138.2 kg)   SpO2 97%   BMI 45.64 kg/m  Body mass index is 45.64 kg/m. Wt Readings from Last 3 Encounters:  11/24/17 (!) 304 lb 9.6 oz (138.2 kg)  07/15/17 287 lb (130.2 kg)  11/10/16 (!) 312 lb 3.2 oz (141.6 kg)   Constitutional: overweight, in NAD Eyes:  PERRLA, EOMI, no exophthalmos ENT: moist mucous membranes, no thyromegaly, no cervical lymphadenopathy Cardiovascular: palpitations, RR, No  MRG Respiratory: CTA B Gastrointestinal: abdomen soft, NT, ND, BS+ Musculoskeletal: no deformities, strength intact in all 4 Skin: moist, warm, no rashes Neurological: no tremor with outstretched hands, DTR normal in all 4  ASSESSMENT: 1. DM2, insulin-dependent, uncontrolled, with complications - PN  - Patient had a lot of problems controlling her diabetes over the years due to in predictability of retaining the food she ate in the setting of her autonomic GI neuropathy.  Now with a permanent colostomy she eats smaller meals but more frequently.   Humalog did not work for her >> sugars >200 constantly. Since she cannot use Humalog, we sent and then re-sent a PA for NovoLog (last form sent in 07/2015). NovoLog was finally approved for her, but this form has to be sent every year!  2. Vit D def  PLAN:  1. Patient with long-standing, uncontrolled, type 2 diabetes, with much worse control at last visit after she developed a parastomal hernia and was in a lot of pain. She was also not eating well due to the pain and she was missing his doses. At this visit, sugars improved although they are still above target. I think stopping milk intake helped and I advised her to continue to stay off this. - she is usually eating dinner around 7 PM, after which, around 8 PM, she is taking the Levemir dose. She then goes to bed around 1-2 AM and wakes up around 11 am-1 pm. Sugars are almost all at goal at bedtime but they increase at waking up. I advised her to try to split the Levemir again initially into a 50-50 percent dose and move the evening dose around the time of bedtime, rather than dinnertime. I also advised her to increase the bedtime insulin if the sugars remain high in the morning. - the FreeStyle libre CGM was not covered for her, as she was not checking sugars enough, but now she is doing a good job checking sugars approximately 4 times a day. I advised her to contact [employer] supplier company againto  see if she can obtain it now. - I suggested to:  Patient Instructions  Please try to: - Levemir to 22 units 2x a day (may need to increase the dose at night if sugars in am still high)  Continue: - NovoLog:  - ICR 1:4 - but 1:2 for potatoes and rice  - target: 150  - ISF: 150-200: 4 units, 200-230: 8 units, 230-and higher: 10 units  Please come back for a follow-up appointment in 6 months.    - today, HbA1c is 8.2% (lower) - continue checking sugars at different times of the day - check 3x a day, rotating checks - advised for yearly eye exams >> she is not UTD - Return to clinic in 6 mo with sugar log    2. HL - Reviewed latest fasting lipid panel from 06/2017: High triglycerides.  Also, LDL above goal. - First-line in the treatment of high triglyceride levels are dietary changes - I suggested to adjust her diet after the results came back but she mentions that she is eating a good diet - she had side effects from fenofibrate and is now on omega-3 fatty acids  3. Vit D def - Continues on ergocalciferol 50,000 units daily - latest vitamin D level was normal in 08/2016  Carlus Pavlov, MD PhD [facility] Endocrinology

## 2017-11-24 NOTE — Patient Instructions (Addendum)
Please try to: - Levemir to 22 units 2x a day (may need to increase the dose at night if sugars in am still high)  Continue: - NovoLog:  - ICR 1:4 - but 1:2 for potatoes and rice  - target: 150  - ISF: 150-200: 4 units, 200-230: 8 units, 230-and higher: 10 units  Please come back for a follow-up appointment in 6 months.

## 2017-11-25 ENCOUNTER — Telehealth: Payer: Self-pay | Admitting: Internal Medicine

## 2017-11-25 NOTE — Telephone Encounter (Signed)
Need prescription for the test strip for the accu check guide, send to Pharmacy:  [facility] [phone] - [place],  - [address] AT [facility] [address] DEA #:  [id number]

## 2017-11-29 DIAGNOSIS — R1012 Left upper quadrant pain: Secondary | ICD-10-CM | POA: Diagnosis not present

## 2017-11-29 DIAGNOSIS — E119 Type 2 diabetes mellitus without complications: Secondary | ICD-10-CM | POA: Diagnosis not present

## 2017-11-29 DIAGNOSIS — I10 Essential (primary) hypertension: Secondary | ICD-10-CM | POA: Diagnosis not present

## 2017-11-29 DIAGNOSIS — K435 Parastomal hernia without obstruction or  gangrene: Secondary | ICD-10-CM | POA: Diagnosis not present

## 2017-11-29 NOTE — Telephone Encounter (Signed)
Pt called back I stated to pt that her Rx is at her pharmacy. Pt also wanted to relay that she is having a Hernia repair and possible relocation of her ostomy. Thank you!

## 2017-11-29 NOTE — Telephone Encounter (Signed)
Called pt. No answer. Rx for Accu Chek Guide was sent to pharmacy as requested on 05/02.

## 2017-11-30 DIAGNOSIS — E1142 Type 2 diabetes mellitus with diabetic polyneuropathy: Secondary | ICD-10-CM | POA: Diagnosis not present

## 2017-11-30 DIAGNOSIS — Z01818 Encounter for other preprocedural examination: Secondary | ICD-10-CM | POA: Diagnosis not present

## 2017-11-30 DIAGNOSIS — K432 Incisional hernia without obstruction or gangrene: Secondary | ICD-10-CM | POA: Diagnosis not present

## 2017-11-30 NOTE — Telephone Encounter (Signed)
Noted,  Ty

## 2017-12-02 DIAGNOSIS — Z933 Colostomy status: Secondary | ICD-10-CM | POA: Diagnosis not present

## 2017-12-02 DIAGNOSIS — Z981 Arthrodesis status: Secondary | ICD-10-CM | POA: Diagnosis not present

## 2017-12-02 DIAGNOSIS — I10 Essential (primary) hypertension: Secondary | ICD-10-CM | POA: Diagnosis not present

## 2017-12-02 DIAGNOSIS — Z794 Long term (current) use of insulin: Secondary | ICD-10-CM | POA: Diagnosis not present

## 2017-12-02 DIAGNOSIS — J45909 Unspecified asthma, uncomplicated: Secondary | ICD-10-CM | POA: Diagnosis not present

## 2017-12-02 DIAGNOSIS — G4730 Sleep apnea, unspecified: Secondary | ICD-10-CM | POA: Diagnosis not present

## 2017-12-02 DIAGNOSIS — E1142 Type 2 diabetes mellitus with diabetic polyneuropathy: Secondary | ICD-10-CM | POA: Diagnosis not present

## 2017-12-02 DIAGNOSIS — K433 Parastomal hernia with obstruction, without gangrene: Secondary | ICD-10-CM | POA: Diagnosis not present

## 2017-12-02 DIAGNOSIS — Z8249 Family history of ischemic heart disease and other diseases of the circulatory system: Secondary | ICD-10-CM | POA: Diagnosis not present

## 2017-12-02 DIAGNOSIS — Z833 Family history of diabetes mellitus: Secondary | ICD-10-CM | POA: Diagnosis not present

## 2017-12-02 DIAGNOSIS — K430 Incisional hernia with obstruction, without gangrene: Secondary | ICD-10-CM | POA: Diagnosis not present

## 2017-12-07 DIAGNOSIS — I10 Essential (primary) hypertension: Secondary | ICD-10-CM | POA: Diagnosis not present

## 2017-12-07 DIAGNOSIS — E1140 Type 2 diabetes mellitus with diabetic neuropathy, unspecified: Secondary | ICD-10-CM | POA: Diagnosis not present

## 2017-12-07 DIAGNOSIS — Z602 Problems related to living alone: Secondary | ICD-10-CM | POA: Diagnosis not present

## 2017-12-07 DIAGNOSIS — M6281 Muscle weakness (generalized): Secondary | ICD-10-CM | POA: Diagnosis not present

## 2017-12-07 DIAGNOSIS — Z794 Long term (current) use of insulin: Secondary | ICD-10-CM | POA: Diagnosis not present

## 2017-12-07 DIAGNOSIS — M1991 Primary osteoarthritis, unspecified site: Secondary | ICD-10-CM | POA: Diagnosis not present

## 2017-12-07 DIAGNOSIS — E1143 Type 2 diabetes mellitus with diabetic autonomic (poly)neuropathy: Secondary | ICD-10-CM | POA: Diagnosis not present

## 2017-12-07 DIAGNOSIS — Z48815 Encounter for surgical aftercare following surgery on the digestive system: Secondary | ICD-10-CM | POA: Diagnosis not present

## 2017-12-07 DIAGNOSIS — Z7952 Long term (current) use of systemic steroids: Secondary | ICD-10-CM | POA: Diagnosis not present

## 2017-12-07 DIAGNOSIS — Z433 Encounter for attention to colostomy: Secondary | ICD-10-CM | POA: Diagnosis not present

## 2017-12-07 DIAGNOSIS — K219 Gastro-esophageal reflux disease without esophagitis: Secondary | ICD-10-CM | POA: Diagnosis not present

## 2017-12-07 DIAGNOSIS — J45909 Unspecified asthma, uncomplicated: Secondary | ICD-10-CM | POA: Diagnosis not present

## 2017-12-07 DIAGNOSIS — K3184 Gastroparesis: Secondary | ICD-10-CM | POA: Diagnosis not present

## 2017-12-08 DIAGNOSIS — M6281 Muscle weakness (generalized): Secondary | ICD-10-CM | POA: Diagnosis not present

## 2017-12-08 DIAGNOSIS — E1140 Type 2 diabetes mellitus with diabetic neuropathy, unspecified: Secondary | ICD-10-CM | POA: Diagnosis not present

## 2017-12-08 DIAGNOSIS — J45909 Unspecified asthma, uncomplicated: Secondary | ICD-10-CM | POA: Diagnosis not present

## 2017-12-08 DIAGNOSIS — I10 Essential (primary) hypertension: Secondary | ICD-10-CM | POA: Diagnosis not present

## 2017-12-08 DIAGNOSIS — M1991 Primary osteoarthritis, unspecified site: Secondary | ICD-10-CM | POA: Diagnosis not present

## 2017-12-08 DIAGNOSIS — Z602 Problems related to living alone: Secondary | ICD-10-CM | POA: Diagnosis not present

## 2017-12-08 DIAGNOSIS — K219 Gastro-esophageal reflux disease without esophagitis: Secondary | ICD-10-CM | POA: Diagnosis not present

## 2017-12-08 DIAGNOSIS — Z7952 Long term (current) use of systemic steroids: Secondary | ICD-10-CM | POA: Diagnosis not present

## 2017-12-08 DIAGNOSIS — Z433 Encounter for attention to colostomy: Secondary | ICD-10-CM | POA: Diagnosis not present

## 2017-12-08 DIAGNOSIS — Z794 Long term (current) use of insulin: Secondary | ICD-10-CM | POA: Diagnosis not present

## 2017-12-08 DIAGNOSIS — E1143 Type 2 diabetes mellitus with diabetic autonomic (poly)neuropathy: Secondary | ICD-10-CM | POA: Diagnosis not present

## 2017-12-08 DIAGNOSIS — K3184 Gastroparesis: Secondary | ICD-10-CM | POA: Diagnosis not present

## 2017-12-08 DIAGNOSIS — Z48815 Encounter for surgical aftercare following surgery on the digestive system: Secondary | ICD-10-CM | POA: Diagnosis not present

## 2017-12-09 DIAGNOSIS — M1991 Primary osteoarthritis, unspecified site: Secondary | ICD-10-CM | POA: Diagnosis not present

## 2017-12-09 DIAGNOSIS — E1140 Type 2 diabetes mellitus with diabetic neuropathy, unspecified: Secondary | ICD-10-CM | POA: Diagnosis not present

## 2017-12-09 DIAGNOSIS — Z794 Long term (current) use of insulin: Secondary | ICD-10-CM | POA: Diagnosis not present

## 2017-12-09 DIAGNOSIS — Z602 Problems related to living alone: Secondary | ICD-10-CM | POA: Diagnosis not present

## 2017-12-09 DIAGNOSIS — I10 Essential (primary) hypertension: Secondary | ICD-10-CM | POA: Diagnosis not present

## 2017-12-09 DIAGNOSIS — K3184 Gastroparesis: Secondary | ICD-10-CM | POA: Diagnosis not present

## 2017-12-09 DIAGNOSIS — M6281 Muscle weakness (generalized): Secondary | ICD-10-CM | POA: Diagnosis not present

## 2017-12-09 DIAGNOSIS — Z7952 Long term (current) use of systemic steroids: Secondary | ICD-10-CM | POA: Diagnosis not present

## 2017-12-09 DIAGNOSIS — K219 Gastro-esophageal reflux disease without esophagitis: Secondary | ICD-10-CM | POA: Diagnosis not present

## 2017-12-09 DIAGNOSIS — E1143 Type 2 diabetes mellitus with diabetic autonomic (poly)neuropathy: Secondary | ICD-10-CM | POA: Diagnosis not present

## 2017-12-09 DIAGNOSIS — J45909 Unspecified asthma, uncomplicated: Secondary | ICD-10-CM | POA: Diagnosis not present

## 2017-12-09 DIAGNOSIS — Z48815 Encounter for surgical aftercare following surgery on the digestive system: Secondary | ICD-10-CM | POA: Diagnosis not present

## 2017-12-09 DIAGNOSIS — Z433 Encounter for attention to colostomy: Secondary | ICD-10-CM | POA: Diagnosis not present

## 2017-12-12 DIAGNOSIS — Z794 Long term (current) use of insulin: Secondary | ICD-10-CM | POA: Diagnosis not present

## 2017-12-12 DIAGNOSIS — I10 Essential (primary) hypertension: Secondary | ICD-10-CM | POA: Diagnosis not present

## 2017-12-12 DIAGNOSIS — E1140 Type 2 diabetes mellitus with diabetic neuropathy, unspecified: Secondary | ICD-10-CM | POA: Diagnosis not present

## 2017-12-12 DIAGNOSIS — M1991 Primary osteoarthritis, unspecified site: Secondary | ICD-10-CM | POA: Diagnosis not present

## 2017-12-12 DIAGNOSIS — M6281 Muscle weakness (generalized): Secondary | ICD-10-CM | POA: Diagnosis not present

## 2017-12-12 DIAGNOSIS — K219 Gastro-esophageal reflux disease without esophagitis: Secondary | ICD-10-CM | POA: Diagnosis not present

## 2017-12-12 DIAGNOSIS — Z48815 Encounter for surgical aftercare following surgery on the digestive system: Secondary | ICD-10-CM | POA: Diagnosis not present

## 2017-12-12 DIAGNOSIS — Z433 Encounter for attention to colostomy: Secondary | ICD-10-CM | POA: Diagnosis not present

## 2017-12-12 DIAGNOSIS — J45909 Unspecified asthma, uncomplicated: Secondary | ICD-10-CM | POA: Diagnosis not present

## 2017-12-12 DIAGNOSIS — E1143 Type 2 diabetes mellitus with diabetic autonomic (poly)neuropathy: Secondary | ICD-10-CM | POA: Diagnosis not present

## 2017-12-12 DIAGNOSIS — K3184 Gastroparesis: Secondary | ICD-10-CM | POA: Diagnosis not present

## 2017-12-12 DIAGNOSIS — Z7952 Long term (current) use of systemic steroids: Secondary | ICD-10-CM | POA: Diagnosis not present

## 2017-12-12 DIAGNOSIS — Z602 Problems related to living alone: Secondary | ICD-10-CM | POA: Diagnosis not present

## 2017-12-13 DIAGNOSIS — Z794 Long term (current) use of insulin: Secondary | ICD-10-CM | POA: Diagnosis not present

## 2017-12-13 DIAGNOSIS — E1142 Type 2 diabetes mellitus with diabetic polyneuropathy: Secondary | ICD-10-CM | POA: Diagnosis not present

## 2017-12-14 DIAGNOSIS — K3184 Gastroparesis: Secondary | ICD-10-CM | POA: Diagnosis not present

## 2017-12-14 DIAGNOSIS — I10 Essential (primary) hypertension: Secondary | ICD-10-CM | POA: Diagnosis not present

## 2017-12-14 DIAGNOSIS — E1143 Type 2 diabetes mellitus with diabetic autonomic (poly)neuropathy: Secondary | ICD-10-CM | POA: Diagnosis not present

## 2017-12-14 DIAGNOSIS — J45909 Unspecified asthma, uncomplicated: Secondary | ICD-10-CM | POA: Diagnosis not present

## 2017-12-14 DIAGNOSIS — M6281 Muscle weakness (generalized): Secondary | ICD-10-CM | POA: Diagnosis not present

## 2017-12-14 DIAGNOSIS — Z794 Long term (current) use of insulin: Secondary | ICD-10-CM | POA: Diagnosis not present

## 2017-12-14 DIAGNOSIS — Z48815 Encounter for surgical aftercare following surgery on the digestive system: Secondary | ICD-10-CM | POA: Diagnosis not present

## 2017-12-14 DIAGNOSIS — Z433 Encounter for attention to colostomy: Secondary | ICD-10-CM | POA: Diagnosis not present

## 2017-12-14 DIAGNOSIS — K219 Gastro-esophageal reflux disease without esophagitis: Secondary | ICD-10-CM | POA: Diagnosis not present

## 2017-12-14 DIAGNOSIS — Z602 Problems related to living alone: Secondary | ICD-10-CM | POA: Diagnosis not present

## 2017-12-14 DIAGNOSIS — Z7952 Long term (current) use of systemic steroids: Secondary | ICD-10-CM | POA: Diagnosis not present

## 2017-12-14 DIAGNOSIS — M1991 Primary osteoarthritis, unspecified site: Secondary | ICD-10-CM | POA: Diagnosis not present

## 2017-12-14 DIAGNOSIS — E1140 Type 2 diabetes mellitus with diabetic neuropathy, unspecified: Secondary | ICD-10-CM | POA: Diagnosis not present

## 2017-12-15 DIAGNOSIS — E1140 Type 2 diabetes mellitus with diabetic neuropathy, unspecified: Secondary | ICD-10-CM | POA: Diagnosis not present

## 2017-12-15 DIAGNOSIS — Z602 Problems related to living alone: Secondary | ICD-10-CM | POA: Diagnosis not present

## 2017-12-15 DIAGNOSIS — Z48815 Encounter for surgical aftercare following surgery on the digestive system: Secondary | ICD-10-CM | POA: Diagnosis not present

## 2017-12-15 DIAGNOSIS — J45909 Unspecified asthma, uncomplicated: Secondary | ICD-10-CM | POA: Diagnosis not present

## 2017-12-15 DIAGNOSIS — Z433 Encounter for attention to colostomy: Secondary | ICD-10-CM | POA: Diagnosis not present

## 2017-12-15 DIAGNOSIS — K3184 Gastroparesis: Secondary | ICD-10-CM | POA: Diagnosis not present

## 2017-12-15 DIAGNOSIS — Z7952 Long term (current) use of systemic steroids: Secondary | ICD-10-CM | POA: Diagnosis not present

## 2017-12-15 DIAGNOSIS — Z794 Long term (current) use of insulin: Secondary | ICD-10-CM | POA: Diagnosis not present

## 2017-12-15 DIAGNOSIS — I10 Essential (primary) hypertension: Secondary | ICD-10-CM | POA: Diagnosis not present

## 2017-12-15 DIAGNOSIS — M1991 Primary osteoarthritis, unspecified site: Secondary | ICD-10-CM | POA: Diagnosis not present

## 2017-12-15 DIAGNOSIS — K219 Gastro-esophageal reflux disease without esophagitis: Secondary | ICD-10-CM | POA: Diagnosis not present

## 2017-12-15 DIAGNOSIS — E1143 Type 2 diabetes mellitus with diabetic autonomic (poly)neuropathy: Secondary | ICD-10-CM | POA: Diagnosis not present

## 2017-12-15 DIAGNOSIS — M6281 Muscle weakness (generalized): Secondary | ICD-10-CM | POA: Diagnosis not present

## 2017-12-16 ENCOUNTER — Other Ambulatory Visit: Payer: Self-pay | Admitting: Internal Medicine

## 2017-12-16 DIAGNOSIS — J45909 Unspecified asthma, uncomplicated: Secondary | ICD-10-CM | POA: Diagnosis not present

## 2017-12-16 DIAGNOSIS — K219 Gastro-esophageal reflux disease without esophagitis: Secondary | ICD-10-CM | POA: Diagnosis not present

## 2017-12-16 DIAGNOSIS — Z48815 Encounter for surgical aftercare following surgery on the digestive system: Secondary | ICD-10-CM | POA: Diagnosis not present

## 2017-12-16 DIAGNOSIS — Z602 Problems related to living alone: Secondary | ICD-10-CM | POA: Diagnosis not present

## 2017-12-16 DIAGNOSIS — Z433 Encounter for attention to colostomy: Secondary | ICD-10-CM | POA: Diagnosis not present

## 2017-12-16 DIAGNOSIS — E1140 Type 2 diabetes mellitus with diabetic neuropathy, unspecified: Secondary | ICD-10-CM | POA: Diagnosis not present

## 2017-12-16 DIAGNOSIS — M1991 Primary osteoarthritis, unspecified site: Secondary | ICD-10-CM | POA: Diagnosis not present

## 2017-12-16 DIAGNOSIS — M6281 Muscle weakness (generalized): Secondary | ICD-10-CM | POA: Diagnosis not present

## 2017-12-16 DIAGNOSIS — Z7952 Long term (current) use of systemic steroids: Secondary | ICD-10-CM | POA: Diagnosis not present

## 2017-12-16 DIAGNOSIS — I10 Essential (primary) hypertension: Secondary | ICD-10-CM | POA: Diagnosis not present

## 2017-12-16 DIAGNOSIS — E1143 Type 2 diabetes mellitus with diabetic autonomic (poly)neuropathy: Secondary | ICD-10-CM | POA: Diagnosis not present

## 2017-12-16 DIAGNOSIS — K3184 Gastroparesis: Secondary | ICD-10-CM | POA: Diagnosis not present

## 2017-12-16 DIAGNOSIS — Z794 Long term (current) use of insulin: Secondary | ICD-10-CM | POA: Diagnosis not present

## 2017-12-19 DIAGNOSIS — I10 Essential (primary) hypertension: Secondary | ICD-10-CM | POA: Diagnosis not present

## 2017-12-19 DIAGNOSIS — J45909 Unspecified asthma, uncomplicated: Secondary | ICD-10-CM | POA: Diagnosis not present

## 2017-12-19 DIAGNOSIS — Z794 Long term (current) use of insulin: Secondary | ICD-10-CM | POA: Diagnosis not present

## 2017-12-19 DIAGNOSIS — E1143 Type 2 diabetes mellitus with diabetic autonomic (poly)neuropathy: Secondary | ICD-10-CM | POA: Diagnosis not present

## 2017-12-19 DIAGNOSIS — Z7952 Long term (current) use of systemic steroids: Secondary | ICD-10-CM | POA: Diagnosis not present

## 2017-12-19 DIAGNOSIS — M1991 Primary osteoarthritis, unspecified site: Secondary | ICD-10-CM | POA: Diagnosis not present

## 2017-12-19 DIAGNOSIS — Z433 Encounter for attention to colostomy: Secondary | ICD-10-CM | POA: Diagnosis not present

## 2017-12-19 DIAGNOSIS — K219 Gastro-esophageal reflux disease without esophagitis: Secondary | ICD-10-CM | POA: Diagnosis not present

## 2017-12-19 DIAGNOSIS — E1140 Type 2 diabetes mellitus with diabetic neuropathy, unspecified: Secondary | ICD-10-CM | POA: Diagnosis not present

## 2017-12-19 DIAGNOSIS — Z602 Problems related to living alone: Secondary | ICD-10-CM | POA: Diagnosis not present

## 2017-12-19 DIAGNOSIS — Z48815 Encounter for surgical aftercare following surgery on the digestive system: Secondary | ICD-10-CM | POA: Diagnosis not present

## 2017-12-19 DIAGNOSIS — K3184 Gastroparesis: Secondary | ICD-10-CM | POA: Diagnosis not present

## 2017-12-19 DIAGNOSIS — M6281 Muscle weakness (generalized): Secondary | ICD-10-CM | POA: Diagnosis not present

## 2017-12-21 DIAGNOSIS — K3184 Gastroparesis: Secondary | ICD-10-CM | POA: Diagnosis not present

## 2017-12-21 DIAGNOSIS — E1140 Type 2 diabetes mellitus with diabetic neuropathy, unspecified: Secondary | ICD-10-CM | POA: Diagnosis not present

## 2017-12-21 DIAGNOSIS — I10 Essential (primary) hypertension: Secondary | ICD-10-CM | POA: Diagnosis not present

## 2017-12-21 DIAGNOSIS — M6281 Muscle weakness (generalized): Secondary | ICD-10-CM | POA: Diagnosis not present

## 2017-12-21 DIAGNOSIS — Z7952 Long term (current) use of systemic steroids: Secondary | ICD-10-CM | POA: Diagnosis not present

## 2017-12-21 DIAGNOSIS — Z433 Encounter for attention to colostomy: Secondary | ICD-10-CM | POA: Diagnosis not present

## 2017-12-21 DIAGNOSIS — Z794 Long term (current) use of insulin: Secondary | ICD-10-CM | POA: Diagnosis not present

## 2017-12-21 DIAGNOSIS — K219 Gastro-esophageal reflux disease without esophagitis: Secondary | ICD-10-CM | POA: Diagnosis not present

## 2017-12-21 DIAGNOSIS — Z602 Problems related to living alone: Secondary | ICD-10-CM | POA: Diagnosis not present

## 2017-12-21 DIAGNOSIS — M1991 Primary osteoarthritis, unspecified site: Secondary | ICD-10-CM | POA: Diagnosis not present

## 2017-12-21 DIAGNOSIS — J45909 Unspecified asthma, uncomplicated: Secondary | ICD-10-CM | POA: Diagnosis not present

## 2017-12-21 DIAGNOSIS — Z48815 Encounter for surgical aftercare following surgery on the digestive system: Secondary | ICD-10-CM | POA: Diagnosis not present

## 2017-12-21 DIAGNOSIS — E1143 Type 2 diabetes mellitus with diabetic autonomic (poly)neuropathy: Secondary | ICD-10-CM | POA: Diagnosis not present

## 2017-12-22 DIAGNOSIS — Z602 Problems related to living alone: Secondary | ICD-10-CM | POA: Diagnosis not present

## 2017-12-22 DIAGNOSIS — M1991 Primary osteoarthritis, unspecified site: Secondary | ICD-10-CM | POA: Diagnosis not present

## 2017-12-22 DIAGNOSIS — J45909 Unspecified asthma, uncomplicated: Secondary | ICD-10-CM | POA: Diagnosis not present

## 2017-12-22 DIAGNOSIS — E1143 Type 2 diabetes mellitus with diabetic autonomic (poly)neuropathy: Secondary | ICD-10-CM | POA: Diagnosis not present

## 2017-12-22 DIAGNOSIS — M6281 Muscle weakness (generalized): Secondary | ICD-10-CM | POA: Diagnosis not present

## 2017-12-22 DIAGNOSIS — Z794 Long term (current) use of insulin: Secondary | ICD-10-CM | POA: Diagnosis not present

## 2017-12-22 DIAGNOSIS — I10 Essential (primary) hypertension: Secondary | ICD-10-CM | POA: Diagnosis not present

## 2017-12-22 DIAGNOSIS — K219 Gastro-esophageal reflux disease without esophagitis: Secondary | ICD-10-CM | POA: Diagnosis not present

## 2017-12-22 DIAGNOSIS — Z7952 Long term (current) use of systemic steroids: Secondary | ICD-10-CM | POA: Diagnosis not present

## 2017-12-22 DIAGNOSIS — Z48815 Encounter for surgical aftercare following surgery on the digestive system: Secondary | ICD-10-CM | POA: Diagnosis not present

## 2017-12-22 DIAGNOSIS — Z433 Encounter for attention to colostomy: Secondary | ICD-10-CM | POA: Diagnosis not present

## 2017-12-22 DIAGNOSIS — K3184 Gastroparesis: Secondary | ICD-10-CM | POA: Diagnosis not present

## 2017-12-22 DIAGNOSIS — E1140 Type 2 diabetes mellitus with diabetic neuropathy, unspecified: Secondary | ICD-10-CM | POA: Diagnosis not present

## 2017-12-27 DIAGNOSIS — Z7952 Long term (current) use of systemic steroids: Secondary | ICD-10-CM | POA: Diagnosis not present

## 2017-12-27 DIAGNOSIS — Z602 Problems related to living alone: Secondary | ICD-10-CM | POA: Diagnosis not present

## 2017-12-27 DIAGNOSIS — M1991 Primary osteoarthritis, unspecified site: Secondary | ICD-10-CM | POA: Diagnosis not present

## 2017-12-27 DIAGNOSIS — I10 Essential (primary) hypertension: Secondary | ICD-10-CM | POA: Diagnosis not present

## 2017-12-27 DIAGNOSIS — Z48815 Encounter for surgical aftercare following surgery on the digestive system: Secondary | ICD-10-CM | POA: Diagnosis not present

## 2017-12-27 DIAGNOSIS — Z433 Encounter for attention to colostomy: Secondary | ICD-10-CM | POA: Diagnosis not present

## 2017-12-27 DIAGNOSIS — E1143 Type 2 diabetes mellitus with diabetic autonomic (poly)neuropathy: Secondary | ICD-10-CM | POA: Diagnosis not present

## 2017-12-27 DIAGNOSIS — Z794 Long term (current) use of insulin: Secondary | ICD-10-CM | POA: Diagnosis not present

## 2017-12-27 DIAGNOSIS — K3184 Gastroparesis: Secondary | ICD-10-CM | POA: Diagnosis not present

## 2017-12-27 DIAGNOSIS — J45909 Unspecified asthma, uncomplicated: Secondary | ICD-10-CM | POA: Diagnosis not present

## 2017-12-27 DIAGNOSIS — E1140 Type 2 diabetes mellitus with diabetic neuropathy, unspecified: Secondary | ICD-10-CM | POA: Diagnosis not present

## 2017-12-27 DIAGNOSIS — M6281 Muscle weakness (generalized): Secondary | ICD-10-CM | POA: Diagnosis not present

## 2017-12-27 DIAGNOSIS — K219 Gastro-esophageal reflux disease without esophagitis: Secondary | ICD-10-CM | POA: Diagnosis not present

## 2017-12-27 DIAGNOSIS — Z933 Colostomy status: Secondary | ICD-10-CM | POA: Diagnosis not present

## 2017-12-28 ENCOUNTER — Encounter: Payer: Self-pay | Admitting: Internal Medicine

## 2017-12-29 DIAGNOSIS — Z794 Long term (current) use of insulin: Secondary | ICD-10-CM | POA: Diagnosis not present

## 2017-12-29 DIAGNOSIS — Z602 Problems related to living alone: Secondary | ICD-10-CM | POA: Diagnosis not present

## 2017-12-29 DIAGNOSIS — Z48815 Encounter for surgical aftercare following surgery on the digestive system: Secondary | ICD-10-CM | POA: Diagnosis not present

## 2017-12-29 DIAGNOSIS — K219 Gastro-esophageal reflux disease without esophagitis: Secondary | ICD-10-CM | POA: Diagnosis not present

## 2017-12-29 DIAGNOSIS — Z433 Encounter for attention to colostomy: Secondary | ICD-10-CM | POA: Diagnosis not present

## 2017-12-29 DIAGNOSIS — I10 Essential (primary) hypertension: Secondary | ICD-10-CM | POA: Diagnosis not present

## 2017-12-29 DIAGNOSIS — Z7952 Long term (current) use of systemic steroids: Secondary | ICD-10-CM | POA: Diagnosis not present

## 2017-12-29 DIAGNOSIS — K3184 Gastroparesis: Secondary | ICD-10-CM | POA: Diagnosis not present

## 2017-12-29 DIAGNOSIS — J45909 Unspecified asthma, uncomplicated: Secondary | ICD-10-CM | POA: Diagnosis not present

## 2017-12-29 DIAGNOSIS — E1140 Type 2 diabetes mellitus with diabetic neuropathy, unspecified: Secondary | ICD-10-CM | POA: Diagnosis not present

## 2017-12-29 DIAGNOSIS — M1991 Primary osteoarthritis, unspecified site: Secondary | ICD-10-CM | POA: Diagnosis not present

## 2017-12-29 DIAGNOSIS — E1143 Type 2 diabetes mellitus with diabetic autonomic (poly)neuropathy: Secondary | ICD-10-CM | POA: Diagnosis not present

## 2017-12-29 DIAGNOSIS — M6281 Muscle weakness (generalized): Secondary | ICD-10-CM | POA: Diagnosis not present

## 2017-12-30 DIAGNOSIS — Z933 Colostomy status: Secondary | ICD-10-CM | POA: Diagnosis not present

## 2018-01-02 DIAGNOSIS — Z933 Colostomy status: Secondary | ICD-10-CM | POA: Diagnosis not present

## 2018-01-09 DIAGNOSIS — E7800 Pure hypercholesterolemia, unspecified: Secondary | ICD-10-CM | POA: Diagnosis not present

## 2018-01-09 DIAGNOSIS — I10 Essential (primary) hypertension: Secondary | ICD-10-CM | POA: Diagnosis not present

## 2018-01-09 DIAGNOSIS — E1140 Type 2 diabetes mellitus with diabetic neuropathy, unspecified: Secondary | ICD-10-CM | POA: Diagnosis not present

## 2018-01-09 DIAGNOSIS — J45901 Unspecified asthma with (acute) exacerbation: Secondary | ICD-10-CM | POA: Diagnosis not present

## 2018-01-13 DIAGNOSIS — E1142 Type 2 diabetes mellitus with diabetic polyneuropathy: Secondary | ICD-10-CM | POA: Diagnosis not present

## 2018-01-13 DIAGNOSIS — Z794 Long term (current) use of insulin: Secondary | ICD-10-CM | POA: Diagnosis not present

## 2018-01-17 ENCOUNTER — Other Ambulatory Visit: Payer: Self-pay | Admitting: Internal Medicine

## 2018-01-21 ENCOUNTER — Other Ambulatory Visit: Payer: Self-pay | Admitting: Internal Medicine

## 2018-01-24 ENCOUNTER — Ambulatory Visit: Payer: Medicare Other | Admitting: Internal Medicine

## 2018-01-31 ENCOUNTER — Other Ambulatory Visit: Payer: Self-pay | Admitting: Internal Medicine

## 2018-01-31 DIAGNOSIS — E559 Vitamin D deficiency, unspecified: Secondary | ICD-10-CM

## 2018-02-01 DIAGNOSIS — J019 Acute sinusitis, unspecified: Secondary | ICD-10-CM | POA: Diagnosis not present

## 2018-02-01 DIAGNOSIS — J039 Acute tonsillitis, unspecified: Secondary | ICD-10-CM | POA: Diagnosis not present

## 2018-02-07 ENCOUNTER — Telehealth: Payer: Self-pay | Admitting: Internal Medicine

## 2018-02-07 MED ORDER — GLUCAGON (RDNA) 1 MG IJ KIT: 1.0000 mg | PACK | Freq: Once | INTRAMUSCULAR | 0 refills | Status: DC | PRN

## 2018-02-07 NOTE — Telephone Encounter (Signed)
Sent one, she can only have one filled at a time

## 2018-02-07 NOTE — Telephone Encounter (Signed)
Patient needs new Rx for Glucagone sent to [facility] [address] [place]. If possible could it be written for 2 kits (she keeps one with her and one at home)

## 2018-02-13 DIAGNOSIS — Z794 Long term (current) use of insulin: Secondary | ICD-10-CM | POA: Diagnosis not present

## 2018-02-13 DIAGNOSIS — E1142 Type 2 diabetes mellitus with diabetic polyneuropathy: Secondary | ICD-10-CM | POA: Diagnosis not present

## 2018-02-23 ENCOUNTER — Other Ambulatory Visit: Payer: Self-pay | Admitting: Internal Medicine

## 2018-02-27 ENCOUNTER — Other Ambulatory Visit: Payer: Self-pay | Admitting: Internal Medicine

## 2018-02-27 DIAGNOSIS — E559 Vitamin D deficiency, unspecified: Secondary | ICD-10-CM

## 2018-03-06 DIAGNOSIS — E109 Type 1 diabetes mellitus without complications: Secondary | ICD-10-CM | POA: Diagnosis not present

## 2018-03-10 DIAGNOSIS — H5213 Myopia, bilateral: Secondary | ICD-10-CM | POA: Diagnosis not present

## 2018-03-16 DIAGNOSIS — Z794 Long term (current) use of insulin: Secondary | ICD-10-CM | POA: Diagnosis not present

## 2018-03-16 DIAGNOSIS — E1142 Type 2 diabetes mellitus with diabetic polyneuropathy: Secondary | ICD-10-CM | POA: Diagnosis not present

## 2018-03-30 DIAGNOSIS — G43909 Migraine, unspecified, not intractable, without status migrainosus: Secondary | ICD-10-CM | POA: Diagnosis not present

## 2018-03-30 DIAGNOSIS — E1042 Type 1 diabetes mellitus with diabetic polyneuropathy: Secondary | ICD-10-CM | POA: Diagnosis not present

## 2018-03-31 DIAGNOSIS — Z933 Colostomy status: Secondary | ICD-10-CM | POA: Diagnosis not present

## 2018-04-01 ENCOUNTER — Other Ambulatory Visit: Payer: Self-pay | Admitting: Internal Medicine

## 2018-04-05 ENCOUNTER — Other Ambulatory Visit: Payer: Self-pay | Admitting: Internal Medicine

## 2018-04-05 DIAGNOSIS — E559 Vitamin D deficiency, unspecified: Secondary | ICD-10-CM

## 2018-04-07 DIAGNOSIS — L723 Sebaceous cyst: Secondary | ICD-10-CM | POA: Diagnosis not present

## 2018-04-07 DIAGNOSIS — Z933 Colostomy status: Secondary | ICD-10-CM | POA: Diagnosis not present

## 2018-04-11 DIAGNOSIS — L723 Sebaceous cyst: Secondary | ICD-10-CM | POA: Diagnosis not present

## 2018-04-14 ENCOUNTER — Other Ambulatory Visit: Payer: Self-pay | Admitting: Internal Medicine

## 2018-04-14 ENCOUNTER — Telehealth: Payer: Self-pay | Admitting: Internal Medicine

## 2018-04-14 NOTE — Telephone Encounter (Signed)
Refilled. Pt notified.

## 2018-04-14 NOTE — Telephone Encounter (Signed)
Patient is needing her pen needles sent into the pharmacy she is completely out. And [place].  She said she is needing these as soon as possible     [facility] [phone] - [place], [place] - [address] AT [address]

## 2018-04-19 ENCOUNTER — Other Ambulatory Visit: Payer: Self-pay

## 2018-04-19 DIAGNOSIS — E1142 Type 2 diabetes mellitus with diabetic polyneuropathy: Secondary | ICD-10-CM | POA: Diagnosis not present

## 2018-04-19 DIAGNOSIS — Z794 Long term (current) use of insulin: Secondary | ICD-10-CM | POA: Diagnosis not present

## 2018-04-19 MED ORDER — INSULIN ASPART 100 UNIT/ML FLEXPEN: PEN_INJECTOR | SUBCUTANEOUS | 0 refills | Status: DC

## 2018-05-05 ENCOUNTER — Other Ambulatory Visit: Payer: Self-pay | Admitting: Internal Medicine

## 2018-05-05 DIAGNOSIS — E559 Vitamin D deficiency, unspecified: Secondary | ICD-10-CM

## 2018-05-10 DIAGNOSIS — E119 Type 2 diabetes mellitus without complications: Secondary | ICD-10-CM | POA: Diagnosis not present

## 2018-05-10 DIAGNOSIS — R1012 Left upper quadrant pain: Secondary | ICD-10-CM | POA: Diagnosis not present

## 2018-05-10 DIAGNOSIS — I10 Essential (primary) hypertension: Secondary | ICD-10-CM | POA: Diagnosis not present

## 2018-05-11 DIAGNOSIS — Z79899 Other long term (current) drug therapy: Secondary | ICD-10-CM | POA: Diagnosis not present

## 2018-05-11 DIAGNOSIS — E7800 Pure hypercholesterolemia, unspecified: Secondary | ICD-10-CM | POA: Diagnosis not present

## 2018-05-11 DIAGNOSIS — I10 Essential (primary) hypertension: Secondary | ICD-10-CM | POA: Diagnosis not present

## 2018-05-11 DIAGNOSIS — E1140 Type 2 diabetes mellitus with diabetic neuropathy, unspecified: Secondary | ICD-10-CM | POA: Diagnosis not present

## 2018-05-15 DIAGNOSIS — K435 Parastomal hernia without obstruction or  gangrene: Secondary | ICD-10-CM | POA: Diagnosis not present

## 2018-05-15 DIAGNOSIS — R1084 Generalized abdominal pain: Secondary | ICD-10-CM | POA: Diagnosis not present

## 2018-05-24 DIAGNOSIS — Z794 Long term (current) use of insulin: Secondary | ICD-10-CM | POA: Diagnosis not present

## 2018-05-24 DIAGNOSIS — E1142 Type 2 diabetes mellitus with diabetic polyneuropathy: Secondary | ICD-10-CM | POA: Diagnosis not present

## 2018-05-30 DIAGNOSIS — R1012 Left upper quadrant pain: Secondary | ICD-10-CM | POA: Diagnosis not present

## 2018-05-30 DIAGNOSIS — E119 Type 2 diabetes mellitus without complications: Secondary | ICD-10-CM | POA: Diagnosis not present

## 2018-05-30 DIAGNOSIS — I10 Essential (primary) hypertension: Secondary | ICD-10-CM | POA: Diagnosis not present

## 2018-05-30 DIAGNOSIS — K430 Incisional hernia with obstruction, without gangrene: Secondary | ICD-10-CM | POA: Diagnosis not present

## 2018-06-01 ENCOUNTER — Ambulatory Visit: Payer: Medicare Other | Admitting: Internal Medicine

## 2018-06-10 ENCOUNTER — Other Ambulatory Visit: Payer: Self-pay | Admitting: Internal Medicine

## 2018-06-10 DIAGNOSIS — E559 Vitamin D deficiency, unspecified: Secondary | ICD-10-CM

## 2018-06-16 DIAGNOSIS — E119 Type 2 diabetes mellitus without complications: Secondary | ICD-10-CM | POA: Diagnosis not present

## 2018-06-16 DIAGNOSIS — K433 Parastomal hernia with obstruction, without gangrene: Secondary | ICD-10-CM | POA: Diagnosis not present

## 2018-06-16 DIAGNOSIS — E1142 Type 2 diabetes mellitus with diabetic polyneuropathy: Secondary | ICD-10-CM | POA: Diagnosis not present

## 2018-06-16 DIAGNOSIS — Z794 Long term (current) use of insulin: Secondary | ICD-10-CM | POA: Diagnosis not present

## 2018-06-16 DIAGNOSIS — G4730 Sleep apnea, unspecified: Secondary | ICD-10-CM | POA: Diagnosis not present

## 2018-06-16 DIAGNOSIS — J45909 Unspecified asthma, uncomplicated: Secondary | ICD-10-CM | POA: Diagnosis not present

## 2018-06-16 DIAGNOSIS — I10 Essential (primary) hypertension: Secondary | ICD-10-CM | POA: Diagnosis not present

## 2018-06-16 DIAGNOSIS — Z993 Dependence on wheelchair: Secondary | ICD-10-CM | POA: Diagnosis not present

## 2018-06-16 DIAGNOSIS — E785 Hyperlipidemia, unspecified: Secondary | ICD-10-CM | POA: Diagnosis not present

## 2018-06-16 DIAGNOSIS — IMO0001 Reserved for inherently not codable concepts without codable children: Secondary | ICD-10-CM

## 2018-06-16 DIAGNOSIS — K430 Incisional hernia with obstruction, without gangrene: Secondary | ICD-10-CM | POA: Diagnosis not present

## 2018-06-16 HISTORY — DX: Reserved for inherently not codable concepts without codable children: IMO0001

## 2018-06-17 DIAGNOSIS — Z993 Dependence on wheelchair: Secondary | ICD-10-CM | POA: Diagnosis not present

## 2018-06-17 DIAGNOSIS — J45909 Unspecified asthma, uncomplicated: Secondary | ICD-10-CM | POA: Diagnosis not present

## 2018-06-17 DIAGNOSIS — Z794 Long term (current) use of insulin: Secondary | ICD-10-CM | POA: Diagnosis not present

## 2018-06-17 DIAGNOSIS — G4730 Sleep apnea, unspecified: Secondary | ICD-10-CM | POA: Diagnosis not present

## 2018-06-17 DIAGNOSIS — E119 Type 2 diabetes mellitus without complications: Secondary | ICD-10-CM | POA: Diagnosis not present

## 2018-06-17 DIAGNOSIS — E785 Hyperlipidemia, unspecified: Secondary | ICD-10-CM | POA: Diagnosis not present

## 2018-06-17 DIAGNOSIS — K430 Incisional hernia with obstruction, without gangrene: Secondary | ICD-10-CM | POA: Diagnosis not present

## 2018-06-17 DIAGNOSIS — E1142 Type 2 diabetes mellitus with diabetic polyneuropathy: Secondary | ICD-10-CM | POA: Diagnosis not present

## 2018-06-17 DIAGNOSIS — I10 Essential (primary) hypertension: Secondary | ICD-10-CM | POA: Diagnosis not present

## 2018-06-18 DIAGNOSIS — E1142 Type 2 diabetes mellitus with diabetic polyneuropathy: Secondary | ICD-10-CM | POA: Diagnosis not present

## 2018-06-18 DIAGNOSIS — Z993 Dependence on wheelchair: Secondary | ICD-10-CM | POA: Diagnosis not present

## 2018-06-18 DIAGNOSIS — J45909 Unspecified asthma, uncomplicated: Secondary | ICD-10-CM | POA: Diagnosis not present

## 2018-06-18 DIAGNOSIS — E785 Hyperlipidemia, unspecified: Secondary | ICD-10-CM | POA: Diagnosis not present

## 2018-06-18 DIAGNOSIS — I10 Essential (primary) hypertension: Secondary | ICD-10-CM | POA: Diagnosis not present

## 2018-06-18 DIAGNOSIS — K430 Incisional hernia with obstruction, without gangrene: Secondary | ICD-10-CM | POA: Diagnosis not present

## 2018-06-18 DIAGNOSIS — Z794 Long term (current) use of insulin: Secondary | ICD-10-CM | POA: Diagnosis not present

## 2018-06-18 DIAGNOSIS — G4730 Sleep apnea, unspecified: Secondary | ICD-10-CM | POA: Diagnosis not present

## 2018-06-18 DIAGNOSIS — E119 Type 2 diabetes mellitus without complications: Secondary | ICD-10-CM | POA: Diagnosis not present

## 2018-06-19 DIAGNOSIS — E1143 Type 2 diabetes mellitus with diabetic autonomic (poly)neuropathy: Secondary | ICD-10-CM | POA: Diagnosis not present

## 2018-06-19 DIAGNOSIS — K432 Incisional hernia without obstruction or gangrene: Secondary | ICD-10-CM | POA: Diagnosis not present

## 2018-06-19 DIAGNOSIS — I10 Essential (primary) hypertension: Secondary | ICD-10-CM | POA: Diagnosis not present

## 2018-06-19 DIAGNOSIS — Z794 Long term (current) use of insulin: Secondary | ICD-10-CM | POA: Diagnosis not present

## 2018-06-19 DIAGNOSIS — E785 Hyperlipidemia, unspecified: Secondary | ICD-10-CM | POA: Diagnosis not present

## 2018-06-19 DIAGNOSIS — K430 Incisional hernia with obstruction, without gangrene: Secondary | ICD-10-CM | POA: Diagnosis not present

## 2018-06-19 DIAGNOSIS — Z993 Dependence on wheelchair: Secondary | ICD-10-CM | POA: Diagnosis not present

## 2018-06-19 DIAGNOSIS — G4730 Sleep apnea, unspecified: Secondary | ICD-10-CM | POA: Diagnosis not present

## 2018-06-19 DIAGNOSIS — E1142 Type 2 diabetes mellitus with diabetic polyneuropathy: Secondary | ICD-10-CM | POA: Diagnosis not present

## 2018-06-19 DIAGNOSIS — J45909 Unspecified asthma, uncomplicated: Secondary | ICD-10-CM | POA: Diagnosis not present

## 2018-06-22 DIAGNOSIS — R1012 Left upper quadrant pain: Secondary | ICD-10-CM | POA: Diagnosis not present

## 2018-06-22 DIAGNOSIS — K433 Parastomal hernia with obstruction, without gangrene: Secondary | ICD-10-CM | POA: Diagnosis not present

## 2018-06-22 DIAGNOSIS — R1032 Left lower quadrant pain: Secondary | ICD-10-CM | POA: Diagnosis not present

## 2018-06-22 DIAGNOSIS — K7689 Other specified diseases of liver: Secondary | ICD-10-CM | POA: Diagnosis not present

## 2018-06-23 DIAGNOSIS — K7689 Other specified diseases of liver: Secondary | ICD-10-CM | POA: Diagnosis not present

## 2018-06-23 DIAGNOSIS — K433 Parastomal hernia with obstruction, without gangrene: Secondary | ICD-10-CM | POA: Diagnosis not present

## 2018-07-04 DIAGNOSIS — Z794 Long term (current) use of insulin: Secondary | ICD-10-CM | POA: Diagnosis not present

## 2018-07-04 DIAGNOSIS — E1142 Type 2 diabetes mellitus with diabetic polyneuropathy: Secondary | ICD-10-CM | POA: Diagnosis not present

## 2018-07-06 DIAGNOSIS — Z933 Colostomy status: Secondary | ICD-10-CM | POA: Diagnosis not present

## 2018-07-08 DIAGNOSIS — I10 Essential (primary) hypertension: Secondary | ICD-10-CM | POA: Diagnosis not present

## 2018-07-08 DIAGNOSIS — R1084 Generalized abdominal pain: Secondary | ICD-10-CM | POA: Diagnosis not present

## 2018-07-08 DIAGNOSIS — R110 Nausea: Secondary | ICD-10-CM | POA: Diagnosis not present

## 2018-07-09 ENCOUNTER — Other Ambulatory Visit: Payer: Self-pay | Admitting: Internal Medicine

## 2018-07-09 DIAGNOSIS — E559 Vitamin D deficiency, unspecified: Secondary | ICD-10-CM

## 2018-07-10 ENCOUNTER — Other Ambulatory Visit: Payer: Self-pay | Admitting: Internal Medicine

## 2018-07-10 DIAGNOSIS — E559 Vitamin D deficiency, unspecified: Secondary | ICD-10-CM

## 2018-07-12 ENCOUNTER — Other Ambulatory Visit: Payer: Self-pay | Admitting: Internal Medicine

## 2018-07-13 DIAGNOSIS — J0100 Acute maxillary sinusitis, unspecified: Secondary | ICD-10-CM | POA: Diagnosis not present

## 2018-07-26 DIAGNOSIS — Z933 Colostomy status: Secondary | ICD-10-CM | POA: Diagnosis not present

## 2018-07-27 DIAGNOSIS — R109 Unspecified abdominal pain: Secondary | ICD-10-CM | POA: Diagnosis not present

## 2018-07-27 DIAGNOSIS — Z79899 Other long term (current) drug therapy: Secondary | ICD-10-CM | POA: Diagnosis not present

## 2018-08-08 DIAGNOSIS — E1142 Type 2 diabetes mellitus with diabetic polyneuropathy: Secondary | ICD-10-CM | POA: Diagnosis not present

## 2018-08-08 DIAGNOSIS — Z794 Long term (current) use of insulin: Secondary | ICD-10-CM | POA: Diagnosis not present

## 2018-08-09 DIAGNOSIS — G4733 Obstructive sleep apnea (adult) (pediatric): Secondary | ICD-10-CM | POA: Diagnosis not present

## 2018-08-18 DIAGNOSIS — G4733 Obstructive sleep apnea (adult) (pediatric): Secondary | ICD-10-CM | POA: Diagnosis not present

## 2018-08-18 DIAGNOSIS — M545 Low back pain: Secondary | ICD-10-CM | POA: Diagnosis not present

## 2018-08-18 DIAGNOSIS — R0602 Shortness of breath: Secondary | ICD-10-CM | POA: Diagnosis not present

## 2018-08-18 DIAGNOSIS — G2581 Restless legs syndrome: Secondary | ICD-10-CM | POA: Diagnosis not present

## 2018-08-21 DIAGNOSIS — K561 Intussusception: Secondary | ICD-10-CM | POA: Diagnosis not present

## 2018-08-21 DIAGNOSIS — R109 Unspecified abdominal pain: Secondary | ICD-10-CM | POA: Diagnosis not present

## 2018-08-23 ENCOUNTER — Other Ambulatory Visit: Payer: Self-pay | Admitting: Internal Medicine

## 2018-08-29 DIAGNOSIS — Z1231 Encounter for screening mammogram for malignant neoplasm of breast: Secondary | ICD-10-CM | POA: Diagnosis not present

## 2018-09-13 DIAGNOSIS — E1142 Type 2 diabetes mellitus with diabetic polyneuropathy: Secondary | ICD-10-CM | POA: Diagnosis not present

## 2018-09-13 DIAGNOSIS — Z794 Long term (current) use of insulin: Secondary | ICD-10-CM | POA: Diagnosis not present

## 2018-09-15 ENCOUNTER — Ambulatory Visit (INDEPENDENT_AMBULATORY_CARE_PROVIDER_SITE_OTHER): Payer: Medicare Other | Admitting: Internal Medicine

## 2018-09-15 ENCOUNTER — Encounter: Payer: Self-pay | Admitting: Internal Medicine

## 2018-09-15 VITALS — BP 122/78 | HR 102 | Ht 68.5 in | Wt 314.0 lb

## 2018-09-15 DIAGNOSIS — E7849 Other hyperlipidemia: Secondary | ICD-10-CM

## 2018-09-15 DIAGNOSIS — E1165 Type 2 diabetes mellitus with hyperglycemia: Secondary | ICD-10-CM

## 2018-09-15 DIAGNOSIS — E559 Vitamin D deficiency, unspecified: Secondary | ICD-10-CM | POA: Diagnosis not present

## 2018-09-15 DIAGNOSIS — E1142 Type 2 diabetes mellitus with diabetic polyneuropathy: Secondary | ICD-10-CM | POA: Diagnosis not present

## 2018-09-15 DIAGNOSIS — IMO0002 Reserved for concepts with insufficient information to code with codable children: Secondary | ICD-10-CM

## 2018-09-15 LAB — POCT GLYCOSYLATED HEMOGLOBIN (HGB A1C): Hemoglobin A1C: 7 % — AB (ref 4.0–5.6)

## 2018-09-15 LAB — VITAMIN D 25 HYDROXY (VIT D DEFICIENCY, FRACTURES): VITD: 64.54 ng/mL (ref 30.00–100.00)

## 2018-09-15 MED ORDER — INSULIN DEGLUDEC 200 UNIT/ML ~~LOC~~ SOPN: 38.0000 [IU] | PEN_INJECTOR | Freq: Every day | SUBCUTANEOUS | 3 refills | Status: DC

## 2018-09-15 MED ORDER — INSULIN PEN NEEDLE 31G X 8 MM MISC: 3 refills | Status: DC

## 2018-09-15 NOTE — Progress Notes (Signed)
Patient ID: Gavin Smith, male   DOB: [birth year], 43 y.o.   MRN: [id number]  HPI: Gavin Smith is a 43 y.o.-year-old male, initially referred by her PCP, Dr. Theador Hawthorne, for management of DM2, dx in 24 (43 y/o), insulin-dependent, uncontrolled, wit complications (PN). Last visit 9 months ago. Now seeing Dr. Junious Dresser.  She had another hernia surgery since last visit and continues to have pain at the surgical site.  DM2: Reviewed history: She has a h/o autonomic neuropathy of the GI tract (dx in 1986, when she was 43 y/o - not related to DM - severe diarrhea: "food goes through me"), seeing [place]. [place] She now has a permanent colostomy - had sx in 09/2014.   Last hemoglobin A1c was: Lab Results  Component Value Date   HGBA1C 8.2 11/24/2017   HGBA1C 12 07/15/2017   HGBA1C 8.9 11/10/2016  11/10/2016:The HbA1c calculated from the fructosamine was 7.3%, better than the measured HbA1c! 02/28/2015: HbA1c 6.9% 10/21/2014: HbA1c 6.9% - after changing from Humalog to NovoLog  07/23/2014: HbA1c 10.9% ICA Abs negative (01/31/2012) C-pp 5.0, Glu 218 (01/25/2012)  She had high HbA1c levels in the past as her insurance did not approve NovoLog and was forced to change to Humalog/Humulin. Since then,we need to send a preauthorization to her insurance for NovoLog every year.  She is on: - Levemir 22 units 2x a day >> 8-10 am: 38-40 units in am (single am doses work best for her) - NovoLog:  - ICR 1:4 - but 1:2 for starches  - target: 150  - ISF: 150-200: 4 units, 200-230: 8 units, 230-and higher: 10 units She cannot digest pills. She tried oral meds in the past >> "they went right through me". She also tried Lantus >> this did not work for her >> sugars unchanged despite increasing the dose.   Pt checks her sugars >4x a day - Libre CGM: Freestyle Libre CGM parameters - last 30 days - per review of her phone as we could not download her CGM: - time in range:  - low (<70): 2% - normal  (70-170): 64% - high (170-240): 27% - very high (>240): 7% Lowest blood sugar: 49 >> 61 >> 50s (Libre). She has hypoglycemia awareness in the 60s. Highest sugars recently 312 >> upper 200s.  Sugars are close to goal in am but they increase after she starts eating, decreasing again overnight.  -No CKD: Lab Results  Component Value Date   BUN 16 07/15/2017   Lab Results  Component Value Date   CREATININE 0.81 07/15/2017   02/03/2016: 13/0.93, Glu 183 07/02/2015: CMP: Normal, except glucose 216, BUN/creatinine 10/0.81, GFR 93, ALT 39 (0-32), bilirubin 1.3 (0-1.2) 07/23/2014: BUN/creatinine 12/0.9 Lab Results  Component Value Date   MICRALBCREAT 0.9 07/15/2017   MICRALBCREAT 0.5 08/12/2015  On lisinopril.  -+ HL; last set of lipids: Lab Results  Component Value Date   CHOL 210 (H) 07/15/2017   HDL 46.10 07/15/2017   LDLDIRECT 119.0 07/15/2017   TRIG 372.0 (H) 07/15/2017   CHOLHDL 5 07/15/2017  02/03/2016: 205/284/55/93 07/02/2015: 206/229/63/97  07/23/2014: 324/868/42/171 AST/ALT: 121/76 She was taken off statins due to hemochromatosis.she had muscle aches from fenofibrate.  She is on omega-3 fatty acids now.  - last eye exam was in 04/2016: No DR [facility]).  -She has numbness and tingling in her feet.  She sees a podiatrist.  On Neurontin cream.  Off Cymbalta.  Vitamin D def.:  Reviewed her vitamin D levels:  Lab Results  Component Value Date   VD25OH 31.25 07/15/2017   VD25OH 45.42 11/10/2016   VD25OH 35.30 05/31/2016   VD25OH 23.86 (L) 08/12/2015   VD25OH 19.32 (L) 11/28/2014  Vit D 23.5 in 02/03/2016 - on Ergocalciferol 50,000 IU daily. Vit D 13.9 in 06/2014 - despite being on Ergocalciferol 50,000 IU 2x a week. Vit D 19.3 in 11/2014 - repeat, on Ergocalciferol 50,000 IU 2x a week >> dose increased to 4 times a week Vit D 23.86 in 07/2015 - on ergocalciferol 50,000 IU 4x a week >> dose increased to daily   She continues on ergocalciferol 50,000 units  daily.  Last TSH: Lab Results  Component Value Date   TSH 0.762 07/23/2016   She also has hemochromatosis, allergy-triggered asthma, HTN, OSA-on CPAP, transaminitis, GERD, depression. She is on Depo-Provera.  She is here with her service dog, Konrad Dolores - who played in [place] musical and will play in [place].  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  ROS: Constitutional: + weight gain/+ weight loss, no fatigue, no subjective hyperthermia, no subjective hypothermia, + nocturia Eyes: + blurry vision, no xerophthalmia ENT: no sore throat, no nodules palpated in neck, no dysphagia, no odynophagia, no hoarseness Cardiovascular: no CP/no SOB/no palpitations/no leg swelling Respiratory: no cough/no SOB/no wheezing Gastrointestinal: no N/no V/+ D/no C/no acid reflux Musculoskeletal: + Muscle aches/+ joint aches - L knee and lower leg Skin: no rashes, no hair loss Neurological: no tremors/+ numbness/+ tingling/no dizziness  I reviewed pt's medications, allergies, PMH, social hx, family hx, and changes were documented in the history of present illness. Otherwise, unchanged from my initial visit note.  Past Medical History:  Diagnosis Date  . Allergic rhinitis, cause unspecified   . Essential hypertension, benign   . Extrinsic asthma, unspecified   . Generalized osteoarthrosis, involving multiple sites   . Insomnia, unspecified   . Migraine, unspecified, without mention of intractable migraine without mention of status migrainosus   . Pityriasis rosea   . Pure hypercholesterolemia   . Type I (juvenile type) diabetes mellitus without mention of complication, not stated as uncontrolled   . Unspecified sleep apnea    Past Surgical History:  Procedure Laterality Date  . BREAST REDUCTION SURGERY  2005  . CHOLECYSTECTOMY  2000  . RHINOPLASTY  2005  . SPINAL FUSION  2006   L5-S1 spinal fusion   History   Social History  . Marital Status: Single    Spouse Name:  N/A  . Number of Children: 0  . Years of Education: 14   Occupational History  . Prev. EMS, now disabled   Social History Main Topics  . Smoking status: Never Smoker   . Smokeless tobacco: Not on file  . Alcohol Use: No  . Drug Use: No   Current Outpatient Medications on File Prior to Visit  Medication Sig Dispense Refill  . ACCU-CHEK FASTCLIX LANCETS MISC USE TO CHECK BLOOD SUGAR 4 TIMES A DAY 300 each 3  . albuterol (PROAIR HFA) 108 (90 BASE) MCG/ACT inhaler Inhale 1 puff into the lungs every 4 (four) hours as needed for wheezing.     Marland Kitchen albuterol (PROVENTIL) (2.5 MG/3ML) 0.083% nebulizer solution Take 2.5 mg by nebulization every 6 (six) hours as needed for wheezing.     Marland Kitchen atorvastatin (LIPITOR) 10 MG tablet Take 10 mg by mouth daily.   0  . CALCIUM PO Take 1 tablet by mouth daily.    Marland Kitchen glucagon (GLUCAGON EMERGENCY)  1 MG injection Inject 1 mg into the vein once as needed for up to 1 dose. 1 each 0  . glucose blood (ACCU-CHEK GUIDE) test strip Use as instructed 4x a day 300 each 3  . Insulin Pen Needle (B-D ULTRAFINE III SHORT PEN) 31G X 8 MM MISC USE 5 TIMES DAILY FOR INJECTIONS 400 each 0  . Insulin Syringe-Needle U-100 31G X 5/16" 0.3 ML MISC Use to inject insulin 3 times daily as directed. 100 each 5  . LEVEMIR FLEXTOUCH 100 UNIT/ML Pen ADMINISTER 42 UNITS UNDER THE SKIN EVERY MORNING 15 mL 0  . lisinopril (PRINIVIL,ZESTRIL) 10 MG tablet Take 10 mg by mouth daily.      Marland Kitchen. loratadine (CLARITIN) 10 MG tablet Take 10 mg by mouth daily.      . Multiple Vitamin (MULTI-DAY PO) Take 1 tablet by mouth daily.      Marland Kitchen. NOVOLOG FLEXPEN 100 UNIT/ML FlexPen INJECT 0 TO 50 UNITS SUBCUTANEOUSLY FOUR TIMES DAILY PER SLIDING SCALE. MAX DAILY DOSE 200 UNITS 180 mL 0  . PULMICORT 0.5 MG/2ML nebulizer solution Take 2 mLs by nebulization daily as needed for wheezing.    . rizatriptan (MAXALT-MLT) 10 MG disintegrating tablet Take at onset of headache, May repeat in 2 hours if needed but no more than 2 in  24 hours    . tiZANidine (ZANAFLEX) 2 MG tablet Take 2 mg by mouth every 8 (eight) hours as needed for muscle spasms.     . traMADol (ULTRAM) 50 MG tablet Take 100 mg by mouth at bedtime.     . vitamin C (ASCORBIC ACID) 500 MG tablet Take 500 mg by mouth daily.      . Vitamin D, Ergocalciferol, (DRISDOL) 1.25 MG (50000 UT) CAPS capsule TAKE 1 CAPSULE BY MOUTH DAILY 90 capsule 2   No current facility-administered medications on file prior to visit.    Allergies  Allergen Reactions  . Fenofibrate Other (See Comments)    Severe leg pain  . Humalog [Insulin Lispro] Other (See Comments)    Drug tolerance >> ineffectiveness! Pt needs NovoLog instead!  Marland Kitchen. Morphine And Related   . Penicillins    Family History  Problem Relation Age of Onset  . Diabetes Mother   . Hypertension Mother   . Hyperlipidemia Mother   . Hypertension Father   . Hyperlipidemia Father   . Diabetes Maternal Grandmother   . Cancer Maternal Grandmother        Breast Cancer  . Hypertension Maternal Grandfather   . Heart disease Maternal Grandfather   . Heart disease Paternal Grandfather   . Cancer Other        Ovarian Cancer-Grandparent   PE: BP 122/78   Pulse (!) 102   Ht 5' 8.5" (1.74 m)   Wt (!) 314 lb (142.4 kg)   SpO2 97%   BMI 47.05 kg/m  Body mass index is 47.05 kg/m. Wt Readings from Last 3 Encounters:  09/15/18 (!) 314 lb (142.4 kg)  11/24/17 (!) 304 lb 9.6 oz (138.2 kg)  07/15/17 287 lb (130.2 kg)   Constitutional: overweight, in NAD Eyes: PERRLA, EOMI, no exophthalmos ENT: moist mucous membranes, no thyromegaly, no cervical lymphadenopathy Cardiovascular: Tachycardia, RR, No MRG Respiratory: CTA B Gastrointestinal: abdomen soft, NT, ND, BS+ Musculoskeletal: no deformities, strength intact in all 4 Skin: moist, warm, no rashes Neurological: no tremor with outstretched hands, DTR normal in all 4  ASSESSMENT: 1. DM2, insulin-dependent, uncontrolled, with complications - PN  - Patient had  a  lot of problems controlling her diabetes over the years due to in predictability of retaining the food she ate in the setting of her autonomic GI neuropathy.  Now with a permanent colostomy she eats smaller meals but more frequently.   Humalog did not work for her >> sugars >200 constantly. Since she cannot use Humalog, we sent and then re-sent a PA for NovoLog (last form sent in 07/2015). NovoLog was finally approved for her, but this form has to be sent every year!  2. Vit D def  PLAN:  1. Patient with longstanding, uncontrolled, type 2 diabetes, with worse control approximately a year ago after he developed a parastomal hernia and was in a lot of pain.  She was not eating well due to pain and she was missing insulin doses.  At last visit, sugars improved but they were still above target.  She was eating dinner usually around 7 PM and going to bed around 1-2 AM, waking up around 11 AM to 1 PM.  Sugars were almost all at goal at bedtime but they were increasing when she woke up.  At that point, I advised her to split Levemir into 2 doses and to move the second dose at bedtime, rather than dinnertime.  I also advised her that she could increase the evening dose if the sugars were still high in the morning.  Since last visit, however, she noticed that her sugars are better if she takes the entire dose of long-acting insulin in the morning.  She is currently on Levemir (formulary exception), but would like to retry Lantus.  However, at this visit, I suggested [place].  I sent a prescription for this to her pharmacy.  I explained that this is longer acting and will help with blood sugar fluctuations.  She agrees to try this.  If this is not covered, will go back to Lantus. -since last OV, she got her Libre CGM >> sugars are better.  They appear to be at goal in the morning and then increase throughout the day.  We will switch to [place], as mentioned above and I will let her change the NovoLog based on her  foods.  She noticed that because of her GI condition, she has to eat frequent, small meals, and covers all of these with insulin.  She injects insulin approximately 8 times daily.  A change in the composition of the diet to a lower fat one) will be very helpful, but she is unfortunately resistant to this. - I suggested to:  Patient Instructions  Please continue: - Levemir 22 units 2x a day - NovoLog:  - ICR 1:4 - but 1:2 for starches  - target: 150  - ISF: 150-200: 4 units, 200-230: 8 units, 230-and higher: 10 units  Please come back for a follow-up appointment in 6 months.    - today, HbA1c is 7.0% (better). - continue checking sugars at different times of the day - check >4x a day, rotating checks - advised for yearly eye exams >> she is not UTD - She had labs by PCP at the beginning of the year.  We will try to obtain these records. - Return to clinic in 6 mo with sugar log -she is not usually coming on a more frequent basis.   2. HL - Reviewed latest lipid panel from 06/2017: High triglycerides, chronic for her.  Also, LDL above goal Lab Results  Component Value Date   CHOL 210 (H) 07/15/2017   HDL 46.10 07/15/2017  LDLDIRECT 119.0 07/15/2017   TRIG 372.0 (H) 07/15/2017   CHOLHDL 5 07/15/2017  -We discussed about dietary changes at last visit. -She tried fenofibrate in the past but had side effects.  She takes omega-3 fatty acids.  3. Vit D def -Continues on ergocalciferol 50,000 units daily -Latest vitamin D level was normal in 08/2016 -We will recheck today  Office Visit on 09/15/2018  Component Date Value Ref Range Status  . VITD 09/15/2018 64.54  30.00 - 100.00 ng/mL Final  . Hemoglobin A1C 09/15/2018 7.0* 4.0 - 5.6 % Final   Vit D normal.  Carlus Pavlov, MD PhD [facility] Endocrinology

## 2018-09-15 NOTE — Patient Instructions (Addendum)
Please continue: - Levemir/Lantus/Tresiba 38-40 units a day - NovoLog:  - ICR 1:4 - but 1:2 for starches  - target: 150  - ISF: 150-200: 4 units, 200-230: 8 units, 230-and higher: 10 units  Please come back for a follow-up appointment in 6 months.

## 2018-10-03 DIAGNOSIS — Z933 Colostomy status: Secondary | ICD-10-CM | POA: Diagnosis not present

## 2018-10-06 DIAGNOSIS — G4733 Obstructive sleep apnea (adult) (pediatric): Secondary | ICD-10-CM | POA: Diagnosis not present

## 2018-10-11 DIAGNOSIS — Z794 Long term (current) use of insulin: Secondary | ICD-10-CM | POA: Diagnosis not present

## 2018-10-11 DIAGNOSIS — E1142 Type 2 diabetes mellitus with diabetic polyneuropathy: Secondary | ICD-10-CM | POA: Diagnosis not present

## 2018-10-25 DIAGNOSIS — G4733 Obstructive sleep apnea (adult) (pediatric): Secondary | ICD-10-CM | POA: Diagnosis not present

## 2018-10-25 DIAGNOSIS — I10 Essential (primary) hypertension: Secondary | ICD-10-CM | POA: Diagnosis not present

## 2018-11-10 DIAGNOSIS — E1142 Type 2 diabetes mellitus with diabetic polyneuropathy: Secondary | ICD-10-CM | POA: Diagnosis not present

## 2018-11-10 DIAGNOSIS — Z794 Long term (current) use of insulin: Secondary | ICD-10-CM | POA: Diagnosis not present

## 2018-11-24 DIAGNOSIS — G4733 Obstructive sleep apnea (adult) (pediatric): Secondary | ICD-10-CM | POA: Diagnosis not present

## 2018-11-24 DIAGNOSIS — I10 Essential (primary) hypertension: Secondary | ICD-10-CM | POA: Diagnosis not present

## 2018-11-28 DIAGNOSIS — G43909 Migraine, unspecified, not intractable, without status migrainosus: Secondary | ICD-10-CM | POA: Diagnosis not present

## 2018-11-28 DIAGNOSIS — E1042 Type 1 diabetes mellitus with diabetic polyneuropathy: Secondary | ICD-10-CM | POA: Diagnosis not present

## 2018-12-07 DIAGNOSIS — I10 Essential (primary) hypertension: Secondary | ICD-10-CM | POA: Diagnosis not present

## 2018-12-07 DIAGNOSIS — Z79899 Other long term (current) drug therapy: Secondary | ICD-10-CM | POA: Diagnosis not present

## 2018-12-07 DIAGNOSIS — E7800 Pure hypercholesterolemia, unspecified: Secondary | ICD-10-CM | POA: Diagnosis not present

## 2018-12-07 DIAGNOSIS — E1140 Type 2 diabetes mellitus with diabetic neuropathy, unspecified: Secondary | ICD-10-CM | POA: Diagnosis not present

## 2018-12-12 DIAGNOSIS — Z794 Long term (current) use of insulin: Secondary | ICD-10-CM | POA: Diagnosis not present

## 2018-12-12 DIAGNOSIS — E1142 Type 2 diabetes mellitus with diabetic polyneuropathy: Secondary | ICD-10-CM | POA: Diagnosis not present

## 2018-12-19 DIAGNOSIS — F649 Gender identity disorder, unspecified: Secondary | ICD-10-CM

## 2018-12-19 HISTORY — DX: Gender identity disorder, unspecified: F64.9

## 2018-12-21 DIAGNOSIS — R0981 Nasal congestion: Secondary | ICD-10-CM | POA: Diagnosis not present

## 2018-12-21 DIAGNOSIS — J342 Deviated nasal septum: Secondary | ICD-10-CM | POA: Diagnosis not present

## 2018-12-21 DIAGNOSIS — S0992XS Unspecified injury of nose, sequela: Secondary | ICD-10-CM | POA: Diagnosis not present

## 2018-12-21 DIAGNOSIS — J343 Hypertrophy of nasal turbinates: Secondary | ICD-10-CM | POA: Diagnosis not present

## 2018-12-25 DIAGNOSIS — G4733 Obstructive sleep apnea (adult) (pediatric): Secondary | ICD-10-CM | POA: Diagnosis not present

## 2018-12-25 DIAGNOSIS — I10 Essential (primary) hypertension: Secondary | ICD-10-CM | POA: Diagnosis not present

## 2018-12-29 DIAGNOSIS — Z933 Colostomy status: Secondary | ICD-10-CM | POA: Diagnosis not present

## 2019-01-12 DIAGNOSIS — Z794 Long term (current) use of insulin: Secondary | ICD-10-CM | POA: Diagnosis not present

## 2019-01-12 DIAGNOSIS — E1142 Type 2 diabetes mellitus with diabetic polyneuropathy: Secondary | ICD-10-CM | POA: Diagnosis not present

## 2019-01-16 ENCOUNTER — Other Ambulatory Visit: Payer: Self-pay | Admitting: Internal Medicine

## 2019-01-24 DIAGNOSIS — G4733 Obstructive sleep apnea (adult) (pediatric): Secondary | ICD-10-CM | POA: Diagnosis not present

## 2019-01-24 DIAGNOSIS — I10 Essential (primary) hypertension: Secondary | ICD-10-CM | POA: Diagnosis not present

## 2019-02-13 DIAGNOSIS — E1142 Type 2 diabetes mellitus with diabetic polyneuropathy: Secondary | ICD-10-CM | POA: Diagnosis not present

## 2019-02-13 DIAGNOSIS — Z794 Long term (current) use of insulin: Secondary | ICD-10-CM | POA: Diagnosis not present

## 2019-02-24 ENCOUNTER — Other Ambulatory Visit: Payer: Self-pay | Admitting: Internal Medicine

## 2019-02-24 DIAGNOSIS — G4733 Obstructive sleep apnea (adult) (pediatric): Secondary | ICD-10-CM | POA: Diagnosis not present

## 2019-02-24 DIAGNOSIS — I10 Essential (primary) hypertension: Secondary | ICD-10-CM | POA: Diagnosis not present

## 2019-03-14 DIAGNOSIS — Z794 Long term (current) use of insulin: Secondary | ICD-10-CM | POA: Diagnosis not present

## 2019-03-14 DIAGNOSIS — E1142 Type 2 diabetes mellitus with diabetic polyneuropathy: Secondary | ICD-10-CM | POA: Diagnosis not present

## 2019-03-16 ENCOUNTER — Ambulatory Visit (INDEPENDENT_AMBULATORY_CARE_PROVIDER_SITE_OTHER): Payer: Medicare Other | Admitting: Internal Medicine

## 2019-03-16 ENCOUNTER — Encounter: Payer: Self-pay | Admitting: Internal Medicine

## 2019-03-16 ENCOUNTER — Other Ambulatory Visit: Payer: Self-pay

## 2019-03-16 DIAGNOSIS — E1142 Type 2 diabetes mellitus with diabetic polyneuropathy: Secondary | ICD-10-CM | POA: Diagnosis not present

## 2019-03-16 DIAGNOSIS — E559 Vitamin D deficiency, unspecified: Secondary | ICD-10-CM

## 2019-03-16 DIAGNOSIS — IMO0002 Reserved for concepts with insufficient information to code with codable children: Secondary | ICD-10-CM

## 2019-03-16 DIAGNOSIS — E7849 Other hyperlipidemia: Secondary | ICD-10-CM

## 2019-03-16 DIAGNOSIS — E1165 Type 2 diabetes mellitus with hyperglycemia: Secondary | ICD-10-CM | POA: Diagnosis not present

## 2019-03-16 NOTE — Progress Notes (Addendum)
Patient ID: Gavin Smith, male   DOB: [birth year], 43 y.o.   MRN: [id number]  Patient location: Home My location: Office  Referring Provider: Wilmer Floorampbell, Stephen D., MD  I connected with the patient on 03/16/19 at  1:57 PM EDT by a video enabled telemedicine application and verified that I am speaking with the correct person.   I discussed the limitations of evaluation and management by telemedicine and the availability of in person appointments. The patient expressed understanding and agreed to proceed.   Details of the encounter are shown below.  HPI: Gavin Smith is a 43 y.o.-year-old male, initially referred by her PCP, Dr. Theador HawthorneKirstein Cox, presenting for follow-up for DM2, dx in [id number](43 y/o), insulin-dependent, uncontrolled, wit complications (PN). Last visit 7 months ago. Now seeing Dr. Junious DresserStephen Campbell.  3 months ago she started testosterone for male to male transition.  She feels better on this.  DM2: Reviewed history: She has a h/o autonomic neuropathy of the GI tract (dx in 1986, when she was 43 y/o - not related to DM - severe diarrhea: "food goes through me"), seeing Dr. [place] She now has a permanent colostomy - had sx in 09/2014.   Last hemoglobin A1c was: 12/07/2018: HbA1c 6.8% Lab Results  Component Value Date   HGBA1C 7.0 (A) 09/15/2018   HGBA1C 8.2 11/24/2017   HGBA1C 12 07/15/2017  11/10/2016:The HbA1c calculated from the fructosamine was 7.3%, better than the measured HbA1c! 02/28/2015: HbA1c 6.9% 10/21/2014: HbA1c 6.9% - after changing from Humalog to NovoLog  07/23/2014: HbA1c 10.9% ICA Abs negative (01/31/2012) C-pp 5.0, Glu 218 (01/25/2012)  She had high HbA1c levels in the past as her insurance did not approve NovoLog and was forced to change to Humalog/Humulin.  Since then we need to send a preauthorization for NovoLog to her insurance every year.  She is on: - Levemir 22 units 2x a day >> 8-10 am: 34-40 units in am >> 22 units 2x a day. [name] >>  sugars higher. - NovoLog:  - ICR 1:4, but 1:2 for starches  - target: 150  - ISF: 150-200: 4 units, 200-230: 8 units, 230-and higher: 10 units She cannot digest pills. She tried oral meds in the past >> "they went right through me". She also tried Lantus >> this did not work for her >> sugars unchanged despite increasing the dose.   She checks her sugars more than 4 times a day with her libre CGM: Freestyle Libre CGM parameters -we could not download this but I reviewed the data on her phone- ave 145 - time in range:  - low (<70): 2% >> 2% - normal (70-170): 64% >> 74% - high (170-240): 27% >> 22% - very high (>240): 7% >> 2% Sugars: - am: 82-124, 130 - lunch: 107-138, 164 - dinner: 96-167, 181, 199 - bedtime: 63, 126-157  Lowest blood sugar: 49 >> 61 >> 50s (Libre) >> 56 (night, Libre). She has hypoglycemia awareness in the 60s. Highest sugars recently 312 >> upper 200s >> 250.  Her sugars are usually in the morning but increase after she starts eating and decrease again overnight.  -No CKD: 06/16/2018: glucose 120, BUN/creatinine 14/0.84 Lab Results  Component Value Date   BUN 16 07/15/2017   Lab Results  Component Value Date   CREATININE 0.81 07/15/2017  02/03/2016: 13/0.93, Glu 183 07/02/2015: CMP: Normal, except glucose 216, BUN/creatinine 10/0.81, GFR 93, ALT 39 (0-32), bilirubin 1.3 (0-1.2) 07/23/2014: BUN/creatinine 12/0.9 Lab Results  Component Value Date  MICRALBCREAT 0.9 07/15/2017   MICRALBCREAT 0.5 08/12/2015  On lisinopril.  -+ HL; last set of lipids: Lab Results  Component Value Date   CHOL 210 (H) 07/15/2017   HDL 46.10 07/15/2017   LDLDIRECT 119.0 07/15/2017   TRIG 372.0 (H) 07/15/2017   CHOLHDL 5 07/15/2017  02/03/2016: 205/284/55/93 07/02/2015: 206/229/63/97  07/23/2014: 324/868/42/171 AST/ALT: 121/76 She was taken off statins due to hemochromatosis.she had muscle aches from fenofibrate.  She is on omega-3 fatty acids now.  - last eye exam  was in 04/2016, no DR [facility]). Scheduled for 03/2019. -+ Numbness and tingling in her feet.  She sees a podiatrist.  She is on Neurontin cream and Cymbalta.  Vitamin D def.:  Latest vitamin D level from 6 months ago was normal: Lab Results  Component Value Date   VD25OH 64.54 09/15/2018   VD25OH 31.25 07/15/2017   VD25OH 45.42 11/10/2016   VD25OH 35.30 05/31/2016   VD25OH 23.86 (L) 08/12/2015   VD25OH 19.32 (L) 11/28/2014  Vit D 23.5 in 02/03/2016 - on Ergocalciferol 50,000 IU daily. Vit D 13.9 in 06/2014 - despite being on Ergocalciferol 50,000 IU 2x a week. Vit D 19.3 in 11/2014 - repeat, on Ergocalciferol 50,000 IU 2x a week >> dose increased to 4 times a week Vit D 23.86 in 07/2015 - on ergocalciferol 50,000 IU 4x a week >> dose increased to daily   She continues on ergocalciferol 50,000 units daily.  Latest TSH was normal: Lab Results  Component Value Date   TSH 0.762 07/23/2016   She also has hemochromatosis, allergy-triggered asthma, HTN, OSA-on CPAP, transaminitis, GERD, depression. She is on Depo-Provera.  She usually comes to the appointments with her service dog, [name] - who played in [name] and also in [name].  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  ROS: Constitutional: no weight gain/no weight loss, no fatigue, no subjective hyperthermia, no subjective hypothermia, + nocturia Eyes: no blurry vision, no xerophthalmia ENT: no sore throat, no nodules palpated in neck, no dysphagia, no odynophagia, no hoarseness Cardiovascular: no CP/no SOB/no palpitations/no leg swelling Respiratory: no cough/no SOB/no wheezing Gastrointestinal: no N/no V/+ D/no C/no acid reflux Musculoskeletal: + muscle aches/+ joint aches (left knee and lower leg) Skin: no rashes, no hair loss Neurological: no tremors/+ numbness/+ tingling/no dizziness  I reviewed pt's medications, allergies, PMH, social hx, family hx, and changes were documented in  the history of present illness. Otherwise, unchanged from my initial visit note.  Past Medical History:  Diagnosis Date  . Allergic rhinitis, cause unspecified   . Essential hypertension, benign   . Extrinsic asthma, unspecified   . Generalized osteoarthrosis, involving multiple sites   . Insomnia, unspecified   . Migraine, unspecified, without mention of intractable migraine without mention of status migrainosus   . Pityriasis rosea   . Pure hypercholesterolemia   . Type I (juvenile type) diabetes mellitus without mention of complication, not stated as uncontrolled   . Unspecified sleep apnea    Past Surgical History:  Procedure Laterality Date  . BREAST REDUCTION SURGERY  2005  . CHOLECYSTECTOMY  2000  . RHINOPLASTY  2005  . SPINAL FUSION  2006   L5-S1 spinal fusion   History   Social History  . Marital Status: Single    Spouse Name: N/A  . Number of Children: 0  . Years of Education: 14   Occupational History  . Prev. EMS, now disabled   Social History Main Topics  .  Smoking status: Never Smoker   . Smokeless tobacco: Not on file  . Alcohol Use: No  . Drug Use: No   Current Outpatient Medications on File Prior to Visit  Medication Sig Dispense Refill  . ACCU-CHEK FASTCLIX LANCETS MISC USE TO CHECK BLOOD SUGAR 4 TIMES A DAY 300 each 3  . albuterol (PROAIR HFA) 108 (90 BASE) MCG/ACT inhaler Inhale 1 puff into the lungs every 4 (four) hours as needed for wheezing.     Marland Kitchen. albuterol (PROVENTIL) (2.5 MG/3ML) 0.083% nebulizer solution Take 2.5 mg by nebulization every 6 (six) hours as needed for wheezing.     Marland Kitchen. atorvastatin (LIPITOR) 10 MG tablet Take 10 mg by mouth daily.   0  . CALCIUM PO Take 1 tablet by mouth daily.    Marland Kitchen. glucagon (GLUCAGON EMERGENCY) 1 MG injection Inject 1 mg into the vein once as needed for up to 1 dose. 1 each 0  . glucose blood (ACCU-CHEK GUIDE) test strip Use as instructed 4x a day 300 each 3  . Insulin Degludec (TRESIBA FLEXTOUCH) 200 UNIT/ML  SOPN Inject 38-40 Units into the skin daily. 3 pen 3  . Insulin Pen Needle (B-D ULTRAFINE III SHORT PEN) 31G X 8 MM MISC USE 8 TIMES DAILY FOR INJECTIONS 500 each 3  . Insulin Syringe-Needle U-100 31G X 5/16" 0.3 ML MISC Use to inject insulin 3 times daily as directed. 100 each 5  . LEVEMIR FLEXTOUCH 100 UNIT/ML Pen ADMINISTER 42 UNITS UNDER THE SKIN EVERY MORNING 15 mL 0  . lisinopril (PRINIVIL,ZESTRIL) 10 MG tablet Take 10 mg by mouth daily.      Marland Kitchen. loratadine (CLARITIN) 10 MG tablet Take 10 mg by mouth daily.      . Multiple Vitamin (MULTI-DAY PO) Take 1 tablet by mouth daily.      Marland Kitchen. NOVOLOG FLEXPEN 100 UNIT/ML FlexPen INJECT 0 TO 50 UNITS SUBCUTANEOUSLY FOUR TIMES DAILY PER SLIDING SCALE. MAX DAILY DOSE 200 UNITS 180 mL 0  . PULMICORT 0.5 MG/2ML nebulizer solution Take 2 mLs by nebulization daily as needed for wheezing.    . rizatriptan (MAXALT-MLT) 10 MG disintegrating tablet Take at onset of headache, May repeat in 2 hours if needed but no more than 2 in 24 hours    . tiZANidine (ZANAFLEX) 2 MG tablet Take 2 mg by mouth every 8 (eight) hours as needed for muscle spasms.     . traMADol (ULTRAM) 50 MG tablet Take 100 mg by mouth at bedtime.     . vitamin C (ASCORBIC ACID) 500 MG tablet Take 500 mg by mouth daily.      . Vitamin D, Ergocalciferol, (DRISDOL) 1.25 MG (50000 UT) CAPS capsule TAKE 1 CAPSULE BY MOUTH DAILY 90 capsule 2   No current facility-administered medications on file prior to visit.    Allergies  Allergen Reactions  . Fenofibrate Other (See Comments)    Severe leg pain  . Humalog [Insulin Lispro] Other (See Comments)    Drug tolerance >> ineffectiveness! Pt needs NovoLog instead!  Marland Kitchen. Morphine And Related   . Penicillins    Family History  Problem Relation Age of Onset  . Diabetes Mother   . Hypertension Mother   . Hyperlipidemia Mother   . Hypertension Father   . Hyperlipidemia Father   . Diabetes Maternal Grandmother   . Cancer Maternal Grandmother        Breast  Cancer  . Hypertension Maternal Grandfather   . Heart disease Maternal Grandfather   . Heart  disease Paternal Grandfather   . Cancer Other        Ovarian Cancer-Grandparent   PE: There were no vitals taken for this visit. There is no height or weight on file to calculate BMI. Wt Readings from Last 3 Encounters:  09/15/18 (!) 314 lb (142.4 kg)  11/24/17 (!) 304 lb 9.6 oz (138.2 kg)  07/15/17 287 lb (130.2 kg)   Constitutional:  in NAD  The physical exam was not performed (virtual visit).   ASSESSMENT: 1. DM2, insulin-dependent, uncontrolled, with complications - PN  - Patient had a lot of problems controlling her diabetes over the years due to in predictability of retaining the food she ate in the setting of her autonomic GI neuropathy.  Now with a permanent colostomy she eats smaller meals but more frequently.   Humalog did not work for her >> sugars >200 constantly. Since she cannot use Humalog, we sent and then re-sent a PA for NovoLog (last form sent in 07/2015). NovoLog was finally approved for her, but this form has to be sent every year!  2. Vit D def  PLAN:  1. Patient with uncontrolled type 2 diabetes with worse control in the past after she developed a parastomal hernia was she was not eating well due to pain and was missing insulin doses.  Sugar started to improve afterwards especially after her last surgery and her HbA1c was 7.0% at last visit at that time, she was taking Levemir once a day as she noticed that the twice a day regimen did not work well for her, however, since last visit, she restarted this regimen and this is now working well for her.  I did suggest [place] and she tried this but she felt that her sugars were higher and would not want to restart it.  She also started to use the freestyle libre CGM which, I believe, also help with blood sugars.  She was injecting insulin 8 times a day.  We discussed multiple times in the about diet but she is resistant to  changes as she is convinced that her diet is optimal for her. -At this visit, sugars are almost all at goal, with few hyperglycemic spikes.  Sugars are excellent in the morning and they do increase time during the day but overall, much improved.  Most recent HbA1c was from 3 months ago and this was even better than at last visit, 6.8%.  She has another one coming up in the next few days at next appointment with PCP. -she is interested in an Omnipod >> advised to check with her insurance if they cover one for DM2. If so, needs a referral to DM education. -No changes in her regimen are needed for now - I suggested to:  Patient Instructions  Please continue: - Levemir 22 units in am  - NovoLog:  - ICR 1:4, but 1:2 for starches  - target: 150  - ISF: 150-200: 4 units, 200-230: 8 units, 230-and higher: 10 units  Please come back for a follow-up appointment in 6 months.    - advised to check sugars at different times of the day - 4x a day, rotating check times - advised for yearly eye exams >> she is not UTD - return to clinic in 6 months (per her preference) but sooner if we start her on insulin pump  2. HL - Reviewed latest lipid panel from 06/2017: LDL above target, triglycerides also high Lab Results  Component Value Date   CHOL 210 (H)  07/15/2017   HDL 46.10 07/15/2017   LDLDIRECT 119.0 07/15/2017   TRIG 372.0 (H) 07/15/2017   CHOLHDL 5 07/15/2017  -She tried fenofibrate in the past but had side effects.  Now on omega-3 fatty acids. -We discussed that testosterone can increase LDL and she actually has follow-up with her PCP to have this rechecked in the next few days.  I advised her to ask PCP to send the results to me.  3. Vit D def -Continues on ergocalciferol 50,000 units daily -Latest vitamin D level was normal in 08/2018 -We will recheck at next visit  Philemon Kingdom, MD PhD [facility] Endocrinology

## 2019-03-16 NOTE — Patient Instructions (Signed)
Please continue: - Levemir 34-40 units in am  - NovoLog:  - ICR 1:4, but 1:2 for starches  - target: 150  - ISF: 150-200: 4 units, 200-230: 8 units, 230-and higher: 10 units  Please come back for a follow-up appointment in 6 months.

## 2019-03-20 DIAGNOSIS — I10 Essential (primary) hypertension: Secondary | ICD-10-CM | POA: Diagnosis not present

## 2019-03-20 DIAGNOSIS — E7800 Pure hypercholesterolemia, unspecified: Secondary | ICD-10-CM | POA: Diagnosis not present

## 2019-03-20 DIAGNOSIS — Z91018 Allergy to other foods: Secondary | ICD-10-CM | POA: Diagnosis not present

## 2019-03-20 DIAGNOSIS — E1140 Type 2 diabetes mellitus with diabetic neuropathy, unspecified: Secondary | ICD-10-CM | POA: Diagnosis not present

## 2019-03-20 LAB — LIPID PANEL
Cholesterol: 180 (ref 0–200)
HDL: 48 (ref 35–70)
LDL Cholesterol: 97
LDl/HDL Ratio: 2
Triglycerides: 173 — AB (ref 40–160)

## 2019-03-20 LAB — HEMOGLOBIN A1C: Hemoglobin A1C: 6

## 2019-03-22 ENCOUNTER — Encounter: Payer: Self-pay | Admitting: Internal Medicine

## 2019-03-23 ENCOUNTER — Other Ambulatory Visit: Payer: Self-pay

## 2019-03-23 NOTE — Telephone Encounter (Signed)
Please review lab results and advise 

## 2019-03-23 NOTE — Telephone Encounter (Signed)
Labs have been abstracted as well as printed and labeled for scanning purposes. Placed lab results in scanning file and sent to HIM for scanning purposes.

## 2019-03-27 DIAGNOSIS — E119 Type 2 diabetes mellitus without complications: Secondary | ICD-10-CM | POA: Diagnosis not present

## 2019-03-27 DIAGNOSIS — G4733 Obstructive sleep apnea (adult) (pediatric): Secondary | ICD-10-CM | POA: Diagnosis not present

## 2019-03-27 DIAGNOSIS — I10 Essential (primary) hypertension: Secondary | ICD-10-CM | POA: Diagnosis not present

## 2019-03-27 DIAGNOSIS — Z794 Long term (current) use of insulin: Secondary | ICD-10-CM | POA: Diagnosis not present

## 2019-04-04 DIAGNOSIS — G4733 Obstructive sleep apnea (adult) (pediatric): Secondary | ICD-10-CM | POA: Diagnosis not present

## 2019-04-06 ENCOUNTER — Other Ambulatory Visit: Payer: Self-pay | Admitting: Internal Medicine

## 2019-05-29 DIAGNOSIS — Z8481 Family history of carrier of genetic disease: Secondary | ICD-10-CM

## 2019-05-29 HISTORY — DX: Family history of carrier of genetic disease: Z84.81

## 2019-05-30 ENCOUNTER — Other Ambulatory Visit: Payer: Self-pay

## 2019-05-30 MED ORDER — LEVEMIR FLEXTOUCH 100 UNIT/ML ~~LOC~~ SOPN: PEN_INJECTOR | SUBCUTANEOUS | 1 refills | Status: DC

## 2019-06-26 DIAGNOSIS — F845 Asperger's syndrome: Secondary | ICD-10-CM | POA: Insufficient documentation

## 2019-06-26 HISTORY — DX: Asperger's syndrome: F84.5

## 2019-07-17 DIAGNOSIS — Z09 Encounter for follow-up examination after completed treatment for conditions other than malignant neoplasm: Secondary | ICD-10-CM | POA: Diagnosis not present

## 2019-07-23 DIAGNOSIS — L570 Actinic keratosis: Secondary | ICD-10-CM | POA: Diagnosis not present

## 2019-07-23 DIAGNOSIS — E1142 Type 2 diabetes mellitus with diabetic polyneuropathy: Secondary | ICD-10-CM | POA: Diagnosis not present

## 2019-07-23 DIAGNOSIS — L719 Rosacea, unspecified: Secondary | ICD-10-CM | POA: Diagnosis not present

## 2019-07-23 DIAGNOSIS — Z794 Long term (current) use of insulin: Secondary | ICD-10-CM | POA: Diagnosis not present

## 2019-07-23 DIAGNOSIS — L918 Other hypertrophic disorders of the skin: Secondary | ICD-10-CM | POA: Diagnosis not present

## 2019-07-25 DIAGNOSIS — E785 Hyperlipidemia, unspecified: Secondary | ICD-10-CM | POA: Diagnosis not present

## 2019-07-25 DIAGNOSIS — Z0000 Encounter for general adult medical examination without abnormal findings: Secondary | ICD-10-CM | POA: Diagnosis not present

## 2019-07-25 DIAGNOSIS — Z9181 History of falling: Secondary | ICD-10-CM | POA: Diagnosis not present

## 2019-07-27 ENCOUNTER — Other Ambulatory Visit: Payer: Self-pay | Admitting: Internal Medicine

## 2019-07-27 DIAGNOSIS — Z933 Colostomy status: Secondary | ICD-10-CM | POA: Diagnosis not present

## 2019-07-27 DIAGNOSIS — G4733 Obstructive sleep apnea (adult) (pediatric): Secondary | ICD-10-CM | POA: Diagnosis not present

## 2019-07-27 DIAGNOSIS — E559 Vitamin D deficiency, unspecified: Secondary | ICD-10-CM

## 2019-07-27 DIAGNOSIS — I10 Essential (primary) hypertension: Secondary | ICD-10-CM | POA: Diagnosis not present

## 2019-07-30 DIAGNOSIS — M79672 Pain in left foot: Secondary | ICD-10-CM | POA: Diagnosis not present

## 2019-07-31 NOTE — Telephone Encounter (Signed)
OK 

## 2019-07-31 NOTE — Telephone Encounter (Signed)
Ok to fill 

## 2019-08-14 ENCOUNTER — Other Ambulatory Visit: Payer: Self-pay | Admitting: Internal Medicine

## 2019-08-17 ENCOUNTER — Telehealth: Payer: Self-pay

## 2019-08-17 NOTE — Telephone Encounter (Signed)
Novolog is no longer covered.  Please advise.

## 2019-08-17 NOTE — Telephone Encounter (Signed)
We always have to do a preauthorization for this.  Please look in her chart for the previously scanned ones and let us start one.

## 2019-08-21 NOTE — Telephone Encounter (Signed)
PA sent in

## 2019-08-22 ENCOUNTER — Telehealth: Payer: Self-pay

## 2019-08-22 NOTE — Telephone Encounter (Signed)
Prior authorization for Novolog has been approved by patient's insurance.  Coverage is effective until 07/25/2020  PA Approval/Reference Number [id number]  Approval letter has been sent to scanning.

## 2019-08-23 DIAGNOSIS — Z794 Long term (current) use of insulin: Secondary | ICD-10-CM | POA: Diagnosis not present

## 2019-08-23 DIAGNOSIS — E1142 Type 2 diabetes mellitus with diabetic polyneuropathy: Secondary | ICD-10-CM | POA: Diagnosis not present

## 2019-08-24 DIAGNOSIS — G4733 Obstructive sleep apnea (adult) (pediatric): Secondary | ICD-10-CM | POA: Diagnosis not present

## 2019-09-22 ENCOUNTER — Other Ambulatory Visit: Payer: Self-pay | Admitting: Internal Medicine

## 2019-09-24 NOTE — Telephone Encounter (Signed)
Last office visit 03/16/2019  Cancel/No-show? no  Future office visit scheduled? no  Please advise on refill.

## 2019-09-24 NOTE — Telephone Encounter (Signed)
OK to refill

## 2019-10-22 ENCOUNTER — Other Ambulatory Visit: Payer: Self-pay

## 2019-10-24 ENCOUNTER — Encounter: Payer: Self-pay | Admitting: Internal Medicine

## 2019-10-25 ENCOUNTER — Ambulatory Visit (INDEPENDENT_AMBULATORY_CARE_PROVIDER_SITE_OTHER): Payer: Medicare Other | Admitting: Internal Medicine

## 2019-10-25 ENCOUNTER — Other Ambulatory Visit: Payer: Self-pay

## 2019-10-25 ENCOUNTER — Encounter: Payer: Self-pay | Admitting: Internal Medicine

## 2019-10-25 DIAGNOSIS — E559 Vitamin D deficiency, unspecified: Secondary | ICD-10-CM | POA: Diagnosis not present

## 2019-10-25 DIAGNOSIS — E1165 Type 2 diabetes mellitus with hyperglycemia: Secondary | ICD-10-CM

## 2019-10-25 DIAGNOSIS — E7849 Other hyperlipidemia: Secondary | ICD-10-CM

## 2019-10-25 DIAGNOSIS — E1142 Type 2 diabetes mellitus with diabetic polyneuropathy: Secondary | ICD-10-CM

## 2019-10-25 DIAGNOSIS — IMO0002 Reserved for concepts with insufficient information to code with codable children: Secondary | ICD-10-CM

## 2019-10-25 MED ORDER — FREESTYLE LIBRE 2 READER DEVI: 1.0000 | Freq: Every day | 0 refills | Status: DC

## 2019-10-25 MED ORDER — LANTUS SOLOSTAR 100 UNIT/ML ~~LOC~~ SOPN: 25.0000 [IU] | PEN_INJECTOR | Freq: Two times a day (BID) | SUBCUTANEOUS | 99 refills | Status: DC

## 2019-10-25 MED ORDER — FREESTYLE LIBRE 2 SENSOR MISC: 1.0000 | 3 refills | Status: DC

## 2019-10-25 NOTE — Patient Instructions (Signed)
Please change from Levemir to: - Levemir 25 units 2x a day  Continue:  - NovoLog:  - ICR 1:4, but 1:2 for starches  - target: 150  - ISF: 150-200: 4 units, 200-230: 8 units, 230-and higher: 10 units  Try to obtain the freestyle libre 2 CGM.  Please come back for a follow-up appointment in 6 months.

## 2019-10-25 NOTE — Progress Notes (Signed)
Patient ID: Gavin Smith, male   DOB: 1975-11-28, 44 y.o.   MRN: [id number]  Patient location: Home My location: Office Persons participating in the virtual visit: patient, provider  I connected with the patient on 10/25/19 at  8:04 AM EDT by a video enabled telemedicine application and verified that I am speaking with the correct person.   I discussed the limitations of evaluation and management by telemedicine and the availability of in person appointments. The patient expressed understanding and agreed to proceed.   Details of the encounter are shown below.  HPI: Gavin Smith is a 44 y.o.-year-old male, initially referred by her PCP, Dr. Theador Hawthorne, presenting for follow-up for DM2, dx in [birth year] (44 y/o), insulin-dependent, uncontrolled, wit complications (PN). Last visit 7 months ago (virtual). Now seeing Dr. Junious Dresser.  3 months before our last visit she started testosterone for male to male transition.  She started to feel much better after starting on this.   She had B mastectomy in 06/2019.  This was an extensive surgery.  Her sugars were higher around that time and they were slowly improving.  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   DM2: Reviewed history: She has a h/o autonomic neuropathy of the GI tract (dx in 1986, when she was 44 y/o - not related to DM - severe diarrhea: "food goes through me"), [place]. [place] She now has a permanent colostomy - had sx in 09/2014.   Reviewed HbA1c levels: 10/19/2019: HbA1c 6.7% Lab Results  Component Value Date   HGBA1C 6.0 03/20/2019   HGBA1C 7.0 (A) 09/15/2018   HGBA1C 8.2 11/24/2017  12/07/2018: HbA1c 6.8% 11/10/2016:The HbA1c calculated from the fructosamine was 7.3%, better than the measured HbA1c! 02/28/2015: HbA1c 6.9% 10/21/2014: HbA1c 6.9% - after changing from Humalog to NovoLog  07/23/2014: HbA1c 10.9% ICA Abs negative (01/31/2012) C-pp 5.0, Glu 218 (01/25/2012)  She had high HbA1c levels in the past as her  insurance did not approve NovoLog and was forced to change to Humalog/Humulin.  Since then, we need to send a preauthorization for NovoLog to her insurance every year.  She is on: - Levemir 22 units 2x a day >> 8-10 am: 34-40 units in am >> 22 units twice a day  - NovoLog:  - ICR 1:4, but 1:2 for starches  - target: 150  - ISF: 150-200: 4 units, 200-230: 8 units, 230-and higher: 10 units She cannot digest pills. She tried oral meds in the past >> "they went back to standing through me". She also tried Lantus >> this did not work for her >> sugars unchanged despite increasing the dose.  She tried [place] but sugars were higher.  She checks her sugars more than 4 times a day with her libre CGM: We could not download her CGM today due to the virtual nature of the visit. Sugars: - am: 82-124, 130 >> 75-110 - lunch: 107-138, 164 >> 130-170 - dinner: 96-167, 181, 199 >> 130-170 - bedtime: 63, 126-157 >> low 100s  Lowest blood sugar: 49 >> 61 >> 50s (Libre) >> 56 (night, Libre) >> at night: 42, 59 - seldom. She has hypoglycemia awareness in the 60s. Highest sugars recently 312 >> upper 200s >> 250 >> 400 (after sx), most below 170.  -No CKD: 10/19/2019: Glucose 174, BUN/creatinine 10/1.10, GFR 63 06/16/2018: glucose 120, BUN/creatinine 14/0.84 Lab Results  Component Value Date   BUN 16 07/15/2017   Lab Results  Component Value Date   CREATININE 0.81 07/15/2017  02/03/2016: 13/0.93, Glu 183 07/02/2015: CMP: Normal, except glucose 216, BUN/creatinine 10/0.81, GFR 93, ALT 39 (0-32), bilirubin 1.3 (0-1.2) 07/23/2014: BUN/creatinine 12/0.9 Lab Results  Component Value Date   MICRALBCREAT 0.9 07/15/2017   MICRALBCREAT 0.5 08/12/2015  On lisinopril.  -+; last set of lipids: 10/19/2019: 149/155/50/72 Lab Results  Component Value Date   CHOL 180 03/20/2019   HDL 48 03/20/2019   LDLCALC 97 03/20/2019   LDLDIRECT 119.0 07/15/2017   TRIG 173 (A) 03/20/2019   CHOLHDL 5 07/15/2017   02/03/2016: 205/284/55/93 07/02/2015: 206/229/63/97  07/23/2014: 324/868/42/171 AST/ALT: 121/76 She was taken off statins due to hemochromatosis.she had muscle aches from fenofibrate.  Currently on omega-3 fatty acids.  - last eye exam was in 03/2019: No [facility]  -+ Numbness and tingling in her feet.  she sees a podiatrist and neurology.  She is on Neurontin cream and Cymbalta.  Vitamin D def.:  Latest vitamin D was normal last year Lab Results  Component Value Date   VD25OH 64.54 09/15/2018   VD25OH 31.25 07/15/2017   VD25OH 45.42 11/10/2016   VD25OH 35.30 05/31/2016   VD25OH 23.86 (L) 08/12/2015   VD25OH 19.32 (L) 11/28/2014  Vit D 23.5 in 02/03/2016 - on Ergocalciferol 50,000 IU daily. Vit D 13.9 in 06/2014 - despite being on Ergocalciferol 50,000 IU 2x a week. Vit D 19.3 in 11/2014 - repeat, on Ergocalciferol 50,000 IU 2x a week >> dose increased to 4 times a week Vit D 23.86 in 07/2015 - on ergocalciferol 50,000 IU 4x a week >> dose increased to daily   She continues on ergocalciferol 50,000 units daily.  Latest TSH was normal: Lab Results  Component Value Date   TSH 0.762 07/23/2016   She also has hemochromatosis, allergy-triggered asthma, HTN, OSA-on CPAP, transaminitis, GERD, depression.  She is on Depo-Provera.  She usually comes to the appointments with her service dog, [name] - who played in [employer] and also in [place] [employer].  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  ROS: Constitutional: + weight gain/no weight loss, no fatigue, no subjective hyperthermia, no subjective hypothermia Eyes: no blurry vision, no xerophthalmia ENT: no sore throat, no nodules palpated in neck, no dysphagia, no odynophagia, no hoarseness Cardiovascular: no CP/no SOB/no palpitations/no leg swelling Respiratory: no cough/no SOB/no wheezing Gastrointestinal: no N/no V/+ D/no C/no acid reflux Musculoskeletal: + muscle aches/+ joint aches Skin: no  rashes, no hair loss Neurological: no tremors/+ numbness/+ tingling/no dizziness  I reviewed pt's medications, allergies, PMH, social hx, family hx, and changes were documented in the history of present illness. Otherwise, unchanged from my initial visit note.  Past Medical History:  Diagnosis Date  . Allergic rhinitis, cause unspecified   . Essential hypertension, benign   . Extrinsic asthma, unspecified   . Generalized osteoarthrosis, involving multiple sites   . Insomnia, unspecified   . Migraine, unspecified, without mention of intractable migraine without mention of status migrainosus   . Pityriasis rosea   . Pure hypercholesterolemia   . Type I (juvenile type) diabetes mellitus without mention of complication, not stated as uncontrolled   . Unspecified sleep apnea    Past Surgical History:  Procedure Laterality Date  . BREAST REDUCTION SURGERY  2005  . CHOLECYSTECTOMY  2000  . RHINOPLASTY  2005  . SPINAL FUSION  2006   L5-S1 spinal fusion   History   Social History  . Marital Status: Single    Spouse Name: N/A  . Number of  Children: 0  . Years of Education: 14   Occupational History  . Prev. EMS, now disabled   Social History Main Topics  . Smoking status: Never Smoker   . Smokeless tobacco: Not on file  . Alcohol Use: No  . Drug Use: No   Current Outpatient Medications on File Prior to Visit  Medication Sig Dispense Refill  . ACCU-CHEK FASTCLIX LANCETS MISC USE TO CHECK BLOOD SUGAR 4 TIMES A DAY 300 each 3  . albuterol (PROAIR HFA) 108 (90 BASE) MCG/ACT inhaler Inhale 1 puff into the lungs every 4 (four) hours as needed for wheezing.     Marland Kitchen albuterol (PROVENTIL) (2.5 MG/3ML) 0.083% nebulizer solution Take 2.5 mg by nebulization every 6 (six) hours as needed for wheezing.     Marland Kitchen atorvastatin (LIPITOR) 10 MG tablet Take 10 mg by mouth daily.   0  . CALCIUM PO Take 1 tablet by mouth daily.    Marland Kitchen glucagon (GLUCAGON EMERGENCY) 1 MG injection Inject 1 mg into the vein  once as needed for up to 1 dose. 1 each 0  . glucose blood (ACCU-CHEK GUIDE) test strip Use as instructed 4x a day 300 each 3  . Insulin Degludec (TRESIBA FLEXTOUCH) 200 UNIT/ML SOPN Inject 38-40 Units into the skin daily. 3 pen 3  . Insulin Detemir (LEVEMIR FLEXTOUCH) 100 UNIT/ML Pen ADMINISTER 42 UNITS UNDER THE SKIN EVERY MORNING 40 mL 1  . Insulin Pen Needle (B-D ULTRAFINE III SHORT PEN) 31G X 8 MM MISC USE 8 TIMES DAILY FOR INJECTIONS 500 each 3  . Insulin Syringe-Needle U-100 31G X 5/16" 0.3 ML MISC Use to inject insulin 3 times daily as directed. 100 each 5  . lisinopril (PRINIVIL,ZESTRIL) 10 MG tablet Take 10 mg by mouth daily.      Marland Kitchen loratadine (CLARITIN) 10 MG tablet Take 10 mg by mouth daily.      . Multiple Vitamin (MULTI-DAY PO) Take 1 tablet by mouth daily.      Marland Kitchen NOVOLOG FLEXPEN 100 UNIT/ML FlexPen INJECT 0 TO 50 UNITS UNDER THE SKIN FOUR TIMES DAILY PER SLIDING SCALE. MAX DAILY DOSE OF 200 UNITS 180 mL 0  . PULMICORT 0.5 MG/2ML nebulizer solution Take 2 mLs by nebulization daily as needed for wheezing.    . rizatriptan (MAXALT-MLT) 10 MG disintegrating tablet Take at onset of headache, May repeat in 2 hours if needed but no more than 2 in 24 hours    . tiZANidine (ZANAFLEX) 2 MG tablet Take 2 mg by mouth every 8 (eight) hours as needed for muscle spasms.     . traMADol (ULTRAM) 50 MG tablet Take 100 mg by mouth at bedtime.     . vitamin C (ASCORBIC ACID) 500 MG tablet Take 500 mg by mouth daily.      . Vitamin D, Ergocalciferol, (DRISDOL) 1.25 MG (50000 UT) CAPS capsule TAKE 1 CAPSULE BY MOUTH DAILY 90 capsule 2   No current facility-administered medications on file prior to visit.   Allergies  Allergen Reactions  . Fenofibrate Other (See Comments)    Severe leg pain  . Humalog [Insulin Lispro] Other (See Comments)    Drug tolerance >> ineffectiveness! Pt needs NovoLog instead!  Marland Kitchen Morphine And Related   . Penicillins    Family History  Problem Relation Age of Onset  .  Diabetes Mother   . Hypertension Mother   . Hyperlipidemia Mother   . Hypertension Father   . Hyperlipidemia Father   . Diabetes Maternal Grandmother   .  Cancer Maternal Grandmother        Breast Cancer  . Hypertension Maternal Grandfather   . Heart disease Maternal Grandfather   . Heart disease Paternal Grandfather   . Cancer Other        Ovarian Cancer-Grandparent   PE: There were no vitals taken for this visit. There is no height or weight on file to calculate BMI. Wt Readings from Last 3 Encounters:  09/15/18 (!) 314 lb (142.4 kg)  11/24/17 (!) 304 lb 9.6 oz (138.2 kg)  07/15/17 287 lb (130.2 kg)   Constitutional:  in NAD  The physical exam was not performed (virtual visit).  ASSESSMENT: 1. DM2, insulin-dependent, uncontrolled, with complications - PN  - Patient had a lot of problems controlling her diabetes over the years due to in predictability of retaining the food she ate in the setting of her autonomic GI neuropathy.  Now with a permanent colostomy she eats smaller meals but more frequently.   Humalog did not work for her >> sugars >200 constantly. Since she cannot use Humalog, we sent and then re-sent a PA for NovoLog (last form sent in 07/2015). NovoLog was finally approved for her, but this form has to be sent every year!  2. Vit D def  PLAN:  1. Patient with now better controlled type 2 diabetes, with worse control in the past after she developed a parastomal hernia and was not eating well due to pain and was missing insulin doses. Sugar started to improve afterwards especially after her last surgery and her HbA1c was 7.0% at last visit at that time, she was taking Levemir once a day as she noticed that the twice a day regimen did not work well for her, however, since last visit, she restarted this regimen and this is now working well for her.  We tried [place] but she felt that her sugars were higher on this.  She is using the freestyle libre CGM, which also helps  with blood sugars.  She is injecting insulin approximately 8 times a day. -Sugars are usually lower in the morning and they increase throughout the day, but not significantly so -We reviewed together latest HbA1c from few days ago: 6.7%, slightly higher than before, but still at goal -At this visit, she is telling me that she was given Lantus in the hospital when she went for her surgery, and this controlled her blood sugars well so she is interested in retrying this at a lower dose and twice a day.  I sent a prescription for this to her pharmacy and will use a slightly higher dose, 25 units twice a day. -We will continue the current dose of NovoLog.  Her sugars before meals are higher but she keeps them like this on purpose.  She is adjusting the doses based on her eating and her sugars before meals. -At last visit she expressed her interest in an Omni pod insulin pump and I advised her to check with her insurance if they cover one for type 2 diabetes.  However, she is now continue without the pump.  Due to her occasional nighttime low blood sugars, I recommended the freestyle libre 2 CGM, which has alarms.  Send the prescription to her pharmacy.  I advised her to let me know if she continues to have lows at night. - I suggested to:  Please change from Levemir to: - Levemir 25 units 2x a day  Continue:  - NovoLog:  - ICR 1:4, but 1:2 for starches  -  target: 150  - ISF: 150-200: 4 units, 200-230: 8 units, 230-and higher: 10 units  Try to obtain the freestyle libre 2 CGM.  Please come back for a follow-up appointment in 6 months.    - advised to check sugars at different times of the day - 4x a day, rotating check times - advised for yearly eye exams >> she is UTD - return to clinic in 6 months per her preference  2. HL -Reviewed latest lipid panel from 09/2019: 149/155/50/72, improved -She tried fenofibrate in the past but had side effects.  She is now on omega-3 fatty acids. -She is on  testosterone supplementation, and is aware that this can raise her LDL.   3. Vit D def -She continues on 50,000 units ergocalciferol daily -We reviewed her latest vitamin D level from 08/2018, and this was normal -she had this drawn yesterday at PCPs office and was sent the results to me  Carlus Pavlov, MD PhD [facility]

## 2019-11-01 ENCOUNTER — Telehealth: Payer: Self-pay | Admitting: Internal Medicine

## 2019-11-01 MED ORDER — FREESTYLE LIBRE 2 SENSOR MISC: 1.0000 | 3 refills | Status: DC

## 2019-11-01 MED ORDER — FREESTYLE LIBRE 2 READER DEVI: 1.0000 | Freq: Every day | 0 refills | Status: DC

## 2019-11-01 NOTE — Telephone Encounter (Signed)
Patient called re: RX's that were sent for Continuous Blood Gluc Receiver (FREESTYLE LIBRE 2 READER) DEVI AND Continuous Blood Gluc Sensor (FREESTYLE LIBRE 2 SENSOR) MISC to [facility] cannot be filled at [facility] due to insurance. Patient requests that the RX's listed above be sent to [facility], Ph# [phone] (Patient does not have fax#).

## 2019-11-01 NOTE — Telephone Encounter (Signed)
Rx's for [facility] sent to [facility] as requested.  Pt notified Rx's sent as requested.

## 2019-12-10 ENCOUNTER — Other Ambulatory Visit: Payer: Self-pay | Admitting: Internal Medicine

## 2020-01-29 ENCOUNTER — Telehealth: Payer: Self-pay | Admitting: Internal Medicine

## 2020-01-29 MED ORDER — "INSULIN SYRINGE-NEEDLE U-100 31G X 5/16"" 0.3 ML MISC": 5 refills | Status: DC

## 2020-01-29 NOTE — Telephone Encounter (Signed)
Medication Refill Request  . Did you call your pharmacy and request this refill first?pharmacy was the one calling  . If patient has not contacted pharmacy first, instruct them to do so for future refills.  . Remind them that contacting the pharmacy for their refill is the quickest method to get the refill.  . Refill policy also stated that it will take anywhere between 24-72 hours to receive the refill.    Name of medication? Insulin Syringe-Needle U-100 31G X 5/16" 0.3 ML MISC   Is this a 90 day supply? unknown  Name and location of pharmacy? [facility] on [address] in [place], [phone]

## 2020-01-29 NOTE — Telephone Encounter (Signed)
RX sent

## 2020-02-09 DIAGNOSIS — E1142 Type 2 diabetes mellitus with diabetic polyneuropathy: Secondary | ICD-10-CM | POA: Diagnosis not present

## 2020-02-09 DIAGNOSIS — Z794 Long term (current) use of insulin: Secondary | ICD-10-CM | POA: Diagnosis not present

## 2020-02-16 ENCOUNTER — Other Ambulatory Visit: Payer: Self-pay | Admitting: Internal Medicine

## 2020-02-16 DIAGNOSIS — E559 Vitamin D deficiency, unspecified: Secondary | ICD-10-CM

## 2020-02-20 DIAGNOSIS — Z79899 Other long term (current) drug therapy: Secondary | ICD-10-CM | POA: Diagnosis not present

## 2020-02-20 DIAGNOSIS — E7800 Pure hypercholesterolemia, unspecified: Secondary | ICD-10-CM | POA: Diagnosis not present

## 2020-02-20 DIAGNOSIS — E1140 Type 2 diabetes mellitus with diabetic neuropathy, unspecified: Secondary | ICD-10-CM | POA: Diagnosis not present

## 2020-02-20 DIAGNOSIS — E1165 Type 2 diabetes mellitus with hyperglycemia: Secondary | ICD-10-CM | POA: Diagnosis not present

## 2020-02-20 DIAGNOSIS — I10 Essential (primary) hypertension: Secondary | ICD-10-CM | POA: Diagnosis not present

## 2020-02-20 DIAGNOSIS — E559 Vitamin D deficiency, unspecified: Secondary | ICD-10-CM | POA: Diagnosis not present

## 2020-02-23 ENCOUNTER — Encounter: Payer: Self-pay | Admitting: Internal Medicine

## 2020-02-25 DIAGNOSIS — Z933 Colostomy status: Secondary | ICD-10-CM | POA: Diagnosis not present

## 2020-02-25 NOTE — Telephone Encounter (Signed)
Following message sent to pt:  Gavin Smith,   Dr. Corky Sox will be out of the office until August 16th. Would you prefer that she review these results? If so, we ask that you please await her return for her to review and respond. Otherwise, if you are not wanting to await her return, we will be happy to forward your results to our on call provider for review. Please advise how you wish to proceed.   Thank you, [facility]  Labs have been printed and awaiting pt response.

## 2020-02-25 NOTE — Telephone Encounter (Signed)
Pt has requested to await your return for you to review her results and to address as you are able. I have placed the labs on your desk for you to review.

## 2020-03-01 DIAGNOSIS — Z933 Colostomy status: Secondary | ICD-10-CM | POA: Diagnosis not present

## 2020-03-10 ENCOUNTER — Encounter: Payer: Self-pay | Admitting: Internal Medicine

## 2020-03-10 NOTE — Progress Notes (Signed)
Received labs from 02/20/2020: Glucose 127, BUN/creatinine 11/1.08, GFR 63 HbA1c 6.0% Lipids: 145/173/47/75 Testosterone 468 Vitamin D 47.9 Hemoglobin/hematocrit 16.2 (11.1-15.9)/47.9 (34-20 6.6)

## 2020-04-21 ENCOUNTER — Other Ambulatory Visit: Payer: Self-pay | Admitting: Internal Medicine

## 2020-05-14 ENCOUNTER — Telehealth: Payer: Self-pay | Admitting: Internal Medicine

## 2020-05-14 ENCOUNTER — Encounter: Payer: Self-pay | Admitting: Internal Medicine

## 2020-05-14 ENCOUNTER — Other Ambulatory Visit: Payer: Self-pay | Admitting: Internal Medicine

## 2020-05-14 NOTE — Telephone Encounter (Signed)
Medication Refill Request   Did you call your pharmacy and request this refill first? YES   If patient has not contacted pharmacy first, instruct them to do so for future refills.   Remind them that contacting the pharmacy for their refill is the quickest method to get the refill.   Refill policy also stated that it will take anywhere between 24-72 hours to receive the refill.    Name of medication? NOVOLOG FLEXPEN 100 UNIT/ML FlexPen  Is this a 90 day supply? YES  Name and location of pharmacy? [facility] on [address] in [place]   Patient has also change their name and gender legally- see My Chart message from 05/14/20

## 2020-05-14 NOTE — Telephone Encounter (Signed)
Refilled been send to the pharmacy

## 2020-05-16 ENCOUNTER — Encounter: Payer: Self-pay | Admitting: Internal Medicine

## 2020-05-16 ENCOUNTER — Other Ambulatory Visit: Payer: Self-pay

## 2020-05-16 ENCOUNTER — Ambulatory Visit (INDEPENDENT_AMBULATORY_CARE_PROVIDER_SITE_OTHER): Payer: Medicare Other | Admitting: Internal Medicine

## 2020-05-16 VITALS — BP 112/82 | HR 99 | Ht 68.5 in | Wt 340.8 lb

## 2020-05-16 DIAGNOSIS — E1165 Type 2 diabetes mellitus with hyperglycemia: Secondary | ICD-10-CM | POA: Diagnosis not present

## 2020-05-16 DIAGNOSIS — E7849 Other hyperlipidemia: Secondary | ICD-10-CM

## 2020-05-16 DIAGNOSIS — E1142 Type 2 diabetes mellitus with diabetic polyneuropathy: Secondary | ICD-10-CM | POA: Diagnosis not present

## 2020-05-16 DIAGNOSIS — IMO0002 Reserved for concepts with insufficient information to code with codable children: Secondary | ICD-10-CM

## 2020-05-16 DIAGNOSIS — E559 Vitamin D deficiency, unspecified: Secondary | ICD-10-CM

## 2020-05-16 LAB — POCT GLYCOSYLATED HEMOGLOBIN (HGB A1C): Hemoglobin A1C: 5.7 % — AB (ref 4.0–5.6)

## 2020-05-16 NOTE — Progress Notes (Signed)
Patient ID: Gavin Smith, adult   DOB: 12-May-1976, 44 y.o.   MRN: [id number]  This visit occurred during the SARS-CoV-2 public health emergency.  Safety protocols were in place, including screening questions prior to the visit, additional usage of staff PPE, and extensive cleaning of exam room while observing appropriate contact time as indicated for disinfecting solutions.   HPI: Gavin Smith is a 44 y.o.-year-old adult, initially referred by his PCP, Dr. Theador Smith, presenting for follow-up for DM2, dx in 68 (44 y/o), insulin-dependent, uncontrolled, wit complications (PN). Last visit 6 months ago (partial). Now seeing Gavin Smith.  She continues on testosterone for male to male transition.  She is starting to feel much better after starting this. She had B mastectomy in 06/2019.  This was an extensive surgery.  Recently, she legally changed her name and gender to male.  She just joined the gym -plans to go at least 3 times a week.  One of her best friends killed himself recently and she is understandably affected by this.  She did relax her diet in the last 2 to 3 weeks.  DM2: Reviewed history: She has a h/o autonomic neuropathy of the GI tract (dx in 1986, when she was 44 y/o - not related to DM - severe diarrhea: "food goes through me"), seeing Dr. [place] She now has a permanent colostomy - had sx in 09/2014.   Reviewed HbA1c levels: 02/20/2020: HbA1c 6.0%. 10/19/2019: HbA1c 6.7% Lab Results  Component Value Date   HGBA1C 6.0 03/20/2019   HGBA1C 7.0 (A) 09/15/2018   HGBA1C 8.2 11/24/2017  12/07/2018: HbA1c 6.8% 11/10/2016:The HbA1c calculated from the fructosamine was 7.3%, better than the measured HbA1c! 02/28/2015: HbA1c 6.9% 10/21/2014: HbA1c 6.9% - after changing from Humalog to NovoLog  07/23/2014: HbA1c 10.9% ICA Abs negative (01/31/2012) C-pp 5.0, Glu 218 (01/25/2012)  She had high HbA1c levels in the past as her insurance did not approve NovoLog and was  forced to change to Humalog/Humulin.  Since then, we need to send a preauthorization for NovoLog to her insurance every year.  She is on: - Levemir 22 units 2x a day >> 8-10 am: 34-40 units in am >> 22 >> Lantus 24 units in am and 26 units at night - NovoLog:  - ICR 1:4, but 1:2 for starches  - target: 150  - ISF: 150-200: 4 units, 200-230: 8 units, 230-and higher: 10 units She cannot digest pills. She tried oral meds in the past >> "they went back to standing through me". She also tried Lantus >> this did not work for her >> sugars unchanged despite increasing the dose.  She tried [place] but sugars were higher.  She checks her sugars more than 4 times a day with her libre 2 CGM.   Freestyle libre CGM parameters: - Average: 123 - % active CGM time: 63% of the time - Glucose variability 35.4% (target < or = to 36%) - time in range:  - very low (<54): 0% - low (54-69): 6% - normal range (70-180): 82% - high sugars (181-250): 10% - very high sugars (>250): 2%  Lowest blood sugar: 42, 59 - seldom >> 57 (glucometer) - gastroenteritis. She has hypoglycemia awareness in the 60s. Highest sugars recently 400 (after breast sx), most below 170 >> 280.  -No CKD: 02/20/2020:Glucose 127, BUN/creatinine 11/1.08, GFR 63 10/19/2019: Glucose 174, BUN/creatinine 10/1.10, GFR 63 06/16/2018: glucose 120, BUN/creatinine 14/0.84 Lab Results  Component Value Date   BUN 16 07/15/2017  Lab Results  Component Value Date   CREATININE 0.81 07/15/2017  02/03/2016: 13/0.93, Glu 183 07/02/2015: CMP: Normal, except glucose 216, BUN/creatinine 10/0.81, GFR 93, ALT 39 (0-32), bilirubin 1.3 (0-1.2) 07/23/2014: BUN/creatinine 12/0.9 Lab Results  Component Value Date   MICRALBCREAT 0.9 07/15/2017   MICRALBCREAT 0.5 08/12/2015  On lisinopril.  -+ HL; last set of lipids: 02/20/2020: 145/173/47/75 10/19/2019: 149/155/50/72 Lab Results  Component Value Date   CHOL 180 03/20/2019   HDL 48 03/20/2019    LDLCALC 97 03/20/2019   LDLDIRECT 119.0 07/15/2017   TRIG 173 (A) 03/20/2019   CHOLHDL 5 07/15/2017  02/03/2016: 205/284/55/93 07/02/2015: 206/229/63/97  07/23/2014: 324/868/42/171 AST/ALT: 121/76 She was taken off statins due to hemochromatosis.she had muscle aches on fenofibrate.  Currently on omega-3 fatty acids.  - last eye exam was in 03/2019: No DR [facility]).  -+ she has Numbness and tingling in his feet - and L hand numbness and pain.  She sees a podiatrist and neurology.  On Neurontin cream and Cymbalta.  Vitamin D def.:  Reviewed vitamin D levels: 02/20/2020: Vitamin D 47.9 Lab Results  Component Value Date   VD25OH 64.54 09/15/2018   VD25OH 31.25 07/15/2017   VD25OH 45.42 11/10/2016   VD25OH 35.30 05/31/2016   VD25OH 23.86 (L) 08/12/2015   VD25OH 19.32 (L) 11/28/2014  Vit D 23.5 in 02/03/2016 - on Ergocalciferol 50,000 IU daily. Vit D 13.9 in 06/2014 - despite being on Ergocalciferol 50,000 IU 2x a week. Vit D 19.3 in 11/2014 - repeat, on Ergocalciferol 50,000 IU 2x a week >> dose increased to 4 times a week Vit D 23.86 in 07/2015 - on ergocalciferol 50,000 IU 4x a week >> dose increased to daily   She continues on ergocalciferol 50,000 units daily.  Latest TSH was normal: Lab Results  Component Value Date   TSH 0.762 07/23/2016   She also has hemochromatosis, allergy-triggered asthma, HTN, OSA-on CPAP, transaminitis, GERD, depression.  She is on Depo-Provera.  She usually comes to the appointments with her service dog, Gavin Smith - who played in [employer] [employer] and also in [place].  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  ROS: Constitutional: + weight gain/no weight loss, no fatigue, no subjective hyperthermia, no subjective hypothermia Eyes: no blurry vision, no xerophthalmia ENT: no sore throat, no nodules palpated in neck, no dysphagia, no odynophagia, no hoarseness Cardiovascular: no CP/no SOB/no palpitations/no leg  swelling Respiratory: no cough/no SOB/no wheezing Gastrointestinal: no N/no V/no D/no C/no acid reflux Musculoskeletal: no muscle aches/no joint aches Skin: no rashes, no hair loss Neurological: no tremors/+ numbness/+ tingling/no dizziness  I reviewed pt's medications, allergies, PMH, social hx, family hx, and changes were documented in the history of present illness. Otherwise, unchanged from my initial visit note.  Past Medical History:  Diagnosis Date  . Allergic rhinitis, cause unspecified   . Essential hypertension, benign   . Extrinsic asthma, unspecified   . Generalized osteoarthrosis, involving multiple sites   . Insomnia, unspecified   . Migraine, unspecified, without mention of intractable migraine without mention of status migrainosus   . Pityriasis rosea   . Pure hypercholesterolemia   . Type I (juvenile type) diabetes mellitus without mention of complication, not stated as uncontrolled   . Unspecified sleep apnea    Past Surgical History:  Procedure Laterality Date  . BREAST REDUCTION SURGERY  2005  . CHOLECYSTECTOMY  2000  . RHINOPLASTY  2005  . SPINAL FUSION  2006   L5-S1 spinal  fusion   History   Social History  . Marital Status: Single    Spouse Name: N/A  . Number of Children: 0  . Years of Education: 14   Occupational History  . Prev. EMS, now disabled   Social History Main Topics  . Smoking status: Never Smoker   . Smokeless tobacco: Not on file  . Alcohol Use: No  . Drug Use: No   Current Outpatient Medications on File Prior to Visit  Medication Sig Dispense Refill  . ACCU-CHEK FASTCLIX LANCETS MISC USE TO CHECK BLOOD SUGAR 4 TIMES A DAY 300 each 3  . albuterol (PROAIR HFA) 108 (90 BASE) MCG/ACT inhaler Inhale 1 puff into the lungs every 4 (four) hours as needed for wheezing.     Marland Kitchen. albuterol (PROVENTIL) (2.5 MG/3ML) 0.083% nebulizer solution Take 2.5 mg by nebulization every 6 (six) hours as needed for wheezing.     Marland Kitchen. atorvastatin (LIPITOR) 10  MG tablet Take 10 mg by mouth daily.   0  . CALCIUM PO Take 1 tablet by mouth daily.    . Continuous Blood Gluc Receiver (FREESTYLE LIBRE 2 READER) DEVI 1 each by Does not apply route daily. 1 each 0  . Continuous Blood Gluc Sensor (FREESTYLE LIBRE 2 SENSOR) MISC 1 each by Does not apply route every 14 (fourteen) days. 6 each 3  . glucagon (GLUCAGON EMERGENCY) 1 MG injection Inject 1 mg into the vein once as needed for up to 1 dose. 1 each 0  . glucose blood (ACCU-CHEK GUIDE) test strip Use as instructed 4x a day 300 each 3  . Insulin Degludec (TRESIBA FLEXTOUCH) 200 UNIT/ML SOPN Inject 38-40 Units into the skin daily. 3 pen 3  . Insulin Detemir (LEVEMIR FLEXTOUCH) 100 UNIT/ML Pen ADMINISTER 42 UNITS UNDER THE SKIN EVERY MORNING 40 mL 1  . insulin glargine (LANTUS SOLOSTAR) 100 UNIT/ML Solostar Pen Inject 25 Units into the skin 2 (two) times daily. 5 pen PRN  . Insulin Pen Needle (B-D ULTRAFINE III SHORT PEN) 31G X 8 MM MISC USE 8 TIMES DAILY FOR INJECTIONS 500 each 3  . Insulin Syringe-Needle U-100 31G X 5/16" 0.3 ML MISC Use to inject insulin 3 times daily as directed. 270 each 5  . lisinopril (PRINIVIL,ZESTRIL) 10 MG tablet Take 10 mg by mouth daily.      Marland Kitchen. loratadine (CLARITIN) 10 MG tablet Take 10 mg by mouth daily.      . Multiple Vitamin (MULTI-DAY PO) Take 1 tablet by mouth daily.      Marland Kitchen. NOVOLOG FLEXPEN 100 UNIT/ML FlexPen INJECT 0 TO 50 UNITS UNDER THE SKIN PER SLIDING SCALE FOUR TIMES DAILY WITH MAX DAILY DOSE OF 200 UNITS 180 mL 3  . PULMICORT 0.5 MG/2ML nebulizer solution Take 2 mLs by nebulization daily as needed for wheezing.    . rizatriptan (MAXALT-MLT) 10 MG disintegrating tablet Take at onset of headache, May repeat in 2 hours if needed but no more than 2 in 24 hours    . tiZANidine (ZANAFLEX) 2 MG tablet Take 2 mg by mouth every 8 (eight) hours as needed for muscle spasms.     . traMADol (ULTRAM) 50 MG tablet Take 100 mg by mouth at bedtime.     . vitamin C (ASCORBIC ACID) 500  MG tablet Take 500 mg by mouth daily.      . Vitamin D, Ergocalciferol, (DRISDOL) 1.25 MG (50000 UNIT) CAPS capsule TAKE 1 CAPSULE BY MOUTH DAILY 90 capsule 2   No current facility-administered  medications on file prior to visit.   Allergies  Allergen Reactions  . Fenofibrate Other (See Comments)    Severe leg pain  . Humalog [Insulin Lispro] Other (See Comments)    Drug tolerance >> ineffectiveness! Pt needs NovoLog instead!  Marland Kitchen Morphine And Related   . Penicillins    Family History  Problem Relation Age of Onset  . Diabetes Mother   . Hypertension Mother   . Hyperlipidemia Mother   . Hypertension Father   . Hyperlipidemia Father   . Diabetes Maternal Grandmother   . Cancer Maternal Grandmother        Breast Cancer  . Hypertension Maternal Grandfather   . Heart disease Maternal Grandfather   . Heart disease Paternal Grandfather   . Cancer Other        Ovarian Cancer-Grandparent   PE: BP 112/82   Pulse 99   Ht 5' 8.5" (1.74 m)   Wt (!) 340 lb 12.8 oz (154.6 kg)   SpO2 97%   BMI 51.06 kg/m  Body mass index is 51.06 kg/m. Wt Readings from Last 3 Encounters:  05/16/20 (!) 340 lb 12.8 oz (154.6 kg)  09/15/18 (!) 314 lb (142.4 kg)  11/24/17 (!) 304 lb 9.6 oz (138.2 kg)   Constitutional: overweight, in NAD Eyes: PERRLA, EOMI, no exophthalmos ENT: moist mucous membranes, no thyromegaly, no cervical lymphadenopathy Cardiovascular: tachycardia, RR, No MRG Respiratory: CTA B Gastrointestinal: abdomen soft, NT, ND, BS+ Musculoskeletal: no deformities, strength intact in all 4 Skin: moist, warm, no rashes Neurological: no tremor with outstretched hands, DTR normal in all 4  ASSESSMENT: 1. DM2, insulin-dependent, uncontrolled, with complications - PN  - Patient had a lot of problems controlling her diabetes over the years due to in predictability of retaining the food she ate in the setting of her autonomic GI neuropathy.  Now with a permanent colostomy she eats smaller  meals but more frequently.   Humalog did not work for her >> sugars >200 constantly. Since she cannot use Humalog, we sent and then re-sent a PA for NovoLog (last form sent in 07/2015). NovoLog was finally approved for her, but this form has to be sent every year!  2. Vit D def  PLAN:  1. Patient with now better controlled type 2 diabetes, with worse control in the past after she developed a parastomal hernia and was not eating well due to pain and was missing insulin doses.  At last visit HbA1c was 6.7%, slightly higher than before. -At last visit, she was interested in switching from Levemir to Lantus.  I sent a new prescription to her pharmacy for Lantus and we also increased the dose since that sugars were slightly higher than before.  Of note, she tried [place] in the past when sugars were higher on this.  She is doing a good job adjusting the dose of NovoLog based on her meals and sugars before meals.  At last visit, she was interested in an Omni pod insulin pump and I advised her to check with her insurance if they would cover this for her.  At last visit I did recommend a freestyle libre 2 CGM for her, as she could benefit from alarms since she was having occasional nighttime low blood sugars.  Of note, since then, she had another HbA1c, which has improved to 6.0% in 01/2020. - today's HbA1c, which was excellent, at 5.7%, improved CGM interpretation: -At today's visit, we reviewed together his CGM tracings for the last 2 weeks.  Her  sugars are higher than expected from HbA1c (glucose management indicator is 6.3%).  Also, only 82% of his blood sugars are in target range with 12% higher than 180.  Upon questioning, he mentions that he had a very stressful last 2 to 3 weeks after her friend killed himself.  He has a pattern of increased blood sugars after 5 AM which then peak at 7 AM and then decrease to target levels after 9 AM.  They stay controlled in the rest of the day and night.  He tells me  that he is going to bed late and may have dreams and nightmares and he feels that these may be why sugars are increasing.  In the last 2 days, he is starting to accept reality and, indeed, reviewing the CGM for the last 2 nights, there is no 5 AM hyperglycemic peak. -He does have quite a few low blood sugars on review of her CGM downloads, but they are mild, and he does me that whenever he checks with his glucometer, sugars are now quite that low.  He has been delaying meals as he was very busy. -For now, I would not suggest to change the regimen but we did discuss that if he continues to see low blood sugars throughout the day he may need to decrease the Lantus dose. - I suggested to:  Patient Instructions  Please continue: - Lantus 24 units in am and 26 units at night  - NovoLog:  - ICR 1:4, but 1:2 for starches  - target: 150  - ISF: 150-200: 4 units, 200-230: 8 units, 230-and higher: 10 units  Please come back for a follow-up appointment in 6 months.    - advised to check sugars at different times of the day - 4x a day, rotating check times - advised for yearly eye exams >> he is UTD - return to clinic in 6 months per his preference  2. HL -Reviewed latest lipid panel from 01/2020: Tractions at home with the exception of a slightly high triglyceride level -She tried fenofibrate in the past but had side effects.  She is now on omega-3 fatty acids. -She is on testosterone supplementation, which can increase LDL  3. Vit D def -She continues on 50,000 units ergocalciferol daily -Reviewed latest vitamin D level from 01/2020 and this was normal  Carlus Pavlov, MD PhD [facility]

## 2020-05-16 NOTE — Patient Instructions (Addendum)
Please continue: - Lantus 24 units in am and 26 units at night  - NovoLog:  - ICR 1:4, but 1:2 for starches  - target: 150  - ISF: 150-200: 4 units, 200-230: 8 units, 230-and higher: 10 units  Please come back for a follow-up appointment in 6 months.

## 2020-06-27 DIAGNOSIS — G5603 Carpal tunnel syndrome, bilateral upper limbs: Secondary | ICD-10-CM | POA: Insufficient documentation

## 2020-06-27 HISTORY — DX: Carpal tunnel syndrome, bilateral upper limbs: G56.03

## 2020-07-01 ENCOUNTER — Encounter: Payer: Self-pay | Admitting: Internal Medicine

## 2020-08-16 DIAGNOSIS — N312 Flaccid neuropathic bladder, not elsewhere classified: Secondary | ICD-10-CM | POA: Insufficient documentation

## 2020-09-11 ENCOUNTER — Telehealth: Payer: Self-pay

## 2020-09-11 NOTE — Telephone Encounter (Signed)
Inbound fax from [facility] advising PA request for Novolog Flexpen was approved [id number] from 07/26/2020-07/25/2021

## 2020-10-21 ENCOUNTER — Other Ambulatory Visit: Payer: Self-pay | Admitting: Internal Medicine

## 2020-10-24 ENCOUNTER — Other Ambulatory Visit: Payer: Self-pay | Admitting: Internal Medicine

## 2020-11-14 ENCOUNTER — Encounter: Payer: Self-pay | Admitting: Internal Medicine

## 2020-11-14 ENCOUNTER — Ambulatory Visit (INDEPENDENT_AMBULATORY_CARE_PROVIDER_SITE_OTHER): Payer: Medicare Other | Admitting: Internal Medicine

## 2020-11-14 ENCOUNTER — Other Ambulatory Visit: Payer: Self-pay

## 2020-11-14 VITALS — BP 128/90 | HR 97 | Ht 68.5 in | Wt 346.8 lb

## 2020-11-14 DIAGNOSIS — IMO0002 Reserved for concepts with insufficient information to code with codable children: Secondary | ICD-10-CM

## 2020-11-14 DIAGNOSIS — E559 Vitamin D deficiency, unspecified: Secondary | ICD-10-CM

## 2020-11-14 DIAGNOSIS — E1142 Type 2 diabetes mellitus with diabetic polyneuropathy: Secondary | ICD-10-CM | POA: Diagnosis not present

## 2020-11-14 DIAGNOSIS — E7849 Other hyperlipidemia: Secondary | ICD-10-CM | POA: Diagnosis not present

## 2020-11-14 DIAGNOSIS — E1165 Type 2 diabetes mellitus with hyperglycemia: Secondary | ICD-10-CM | POA: Diagnosis not present

## 2020-11-14 MED ORDER — FREESTYLE LIBRE 2 SENSOR MISC: 1.0000 | 3 refills | Status: DC

## 2020-11-14 MED ORDER — LANTUS SOLOSTAR 100 UNIT/ML ~~LOC~~ SOPN: PEN_INJECTOR | SUBCUTANEOUS | 3 refills | Status: DC

## 2020-11-14 NOTE — Progress Notes (Signed)
Patient ID: Gavin Smith, adult   DOB: 06-14-1976, 45 y.o.   MRN: [id number]  This visit occurred during the SARS-CoV-2 public health emergency.  Safety protocols were in place, including screening questions prior to the visit, additional usage of staff PPE, and extensive cleaning of exam room while observing appropriate contact time as indicated for disinfecting solutions.   HPI: Gavin Smith is a 45 y.o.-year-old adult, initially referred by his PCP, Dr. Theador HawthorneKirstein Cox, presenting for follow-up for DM2, dx in [birth year]1998 [birth year](45 y/o), insulin-dependent, uncontrolled, wit complications (PN). Last visit 6 months ago. Now seeing Dr. Junious DresserStephen Campbell.  Interim history: He continues on testosterone for male to male transition.  He felt much better after starting this.  Had B mastectomy in 06/2019.  This was an extensive surgery.  Before last visit, he legally changed his name and gender to male. Plans to have hysterectomy in 02/2021.  She needs instructions for her insulin regimen for the perioperative period. At last visit, he just joined the gym.  He continues consistently 2x a week. Walks, bicycle, strength. Eliminated starches and diet sodas since last visit.  DM2: Reviewed history: He has a h/o autonomic neuropathy of the GI tract (dx in 1986, when he was 45 y/o - not related to DM - severe diarrhea: "food goes through me"), [place] Dr. [place] >> permanent colostomy - had sx in 09/2014.   Reviewed HbA1c levels: 10/29/2020: HbA1c 6.0% Lab Results  Component Value Date   HGBA1C 5.7 (A) 05/16/2020   HGBA1C 6.0 03/20/2019   HGBA1C 7.0 (A) 09/15/2018  02/20/2020: HbA1c 6.0%. 10/19/2019: HbA1c 6.7% 12/07/2018: HbA1c 6.8% 11/10/2016:The HbA1c calculated from the fructosamine was 7.3%, better than the measured HbA1c! 02/28/2015: HbA1c 6.9% 10/21/2014: HbA1c 6.9% - after changing from Humalog to NovoLog  07/23/2014: HbA1c 10.9% ICA Abs negative (01/31/2012) C-pp 5.0, Glu 218 (01/25/2012)  She had high  HbA1c levels in the past as her insurance did not approve NovoLog and was forced to change to Humalog/Humulin.  Since then, we need to send a preauthorization for NovoLog to her insurance every year.  She is on: - Levemir 22 units 2x a day >> 8-10 am: 34-40 units in am >> 22 >> Lantus 24 units in am and 26 units at night - NovoLog:  - ICR 1:4,  (eliminated starches)  - target: 150  - ISF: 150-200: 4 units, 200-230: 8 units, 230-and higher: 10 units She cannot digest pills. She tried oral meds in the past >> "they went back to standing through me". She also tried Lantus >> this did not work for her >> sugars unchanged despite increasing the dose.  She tried [place] but sugars were higher.  She checks her sugars more than 4 times a day with the freestyle libre 2 CGM:   Previously:   Lowest blood sugar: 42, 59 - seldom >> 57 (glucometer) - gastroenteritis >> 60s. She has hypoglycemia awareness in the 60s. Highest sugars recently 400 (after breast sx), most below 170 >> 280 >> 200s.  -No CKD: 10/29/2020: Glucose 146, BUN/creatinine 16/1.06, GFR 67, ACR 7 02/20/2020:Glucose 127, BUN/creatinine 11/1.08, GFR 63 10/19/2019: Glucose 174, BUN/creatinine 10/1.10, GFR 63 06/16/2018: glucose 120, BUN/creatinine 14/0.84 Lab Results  Component Value Date   BUN 16 07/15/2017   Lab Results  Component Value Date   CREATININE 0.81 07/15/2017  02/03/2016: 13/0.93, Glu 183 07/02/2015: CMP: Normal, except glucose 216, BUN/creatinine 10/0.81, GFR 93, ALT 39 (0-32), bilirubin 1.3 (0-1.2) 07/23/2014: BUN/creatinine 12/0.9 Lab Results  Component  Value Date   MICRALBCREAT 0.9 07/15/2017   MICRALBCREAT 0.5 08/12/2015  On lisinopril.  -+ HL; last set of lipids: 10/29/2020: 162/127/57/83 02/20/2020: 145/173/47/75 10/19/2019: 149/155/50/72 Lab Results  Component Value Date   CHOL 180 03/20/2019   HDL 48 03/20/2019   LDLCALC 97 03/20/2019   LDLDIRECT 119.0 07/15/2017   TRIG 173 (A) 03/20/2019   CHOLHDL  5 07/15/2017  02/03/2016: 205/284/55/93 07/02/2015: 206/229/63/97  07/23/2014: 324/868/42/171 AST/ALT: 121/76 She was taken off statins due to hemochromatosis.she had muscle aches on fenofibrate.  Currently on omega-3 fatty acids.  - last eye exam was in 06/2020: No DR reportedly [facility]).   -+ he has Numbness and tingling in his feet - and L hand numbness and pain.  He sees a podiatrist and neurology.  On Neurontin cream and Cymbalta.  Vitamin D def.:  Reviewed vitamin D levels: 10/29/2020: Vitamin D 55.9 02/20/2020: Vitamin D 47.9 Lab Results  Component Value Date   VD25OH 64.54 09/15/2018   VD25OH 31.25 07/15/2017   VD25OH 45.42 11/10/2016   VD25OH 35.30 05/31/2016   VD25OH 23.86 (L) 08/12/2015   VD25OH 19.32 (L) 11/28/2014  Vit D 23.5 in 02/03/2016 - on Ergocalciferol 50,000 IU daily. Vit D 13.9 in 06/2014 - despite being on Ergocalciferol 50,000 IU 2x a week. Vit D 19.3 in 11/2014 - repeat, on Ergocalciferol 50,000 IU 2x a week >> dose increased to 4 times a week Vit D 23.86 in 07/2015 - on ergocalciferol 50,000 IU 4x a week >> dose increased to daily   He continues on ergocalciferol 50,000 units daily.  Latest TSH was normal: 10/29/2020: TSH 1.83 Lab Results  Component Value Date   TSH 0.762 07/23/2016   He also has hemochromatosis, allergy-triggered asthma, HTN, OSA-on CPAP, transaminitis, GERD, depression.  He  is on Depo-Provera.  He usually comes to the appointments with her service dog, Konrad Dolores - who played in [place] musical and also in [place].  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  ROS: Constitutional: + weight gain/no weight loss, no fatigue, no subjective hyperthermia, no subjective hypothermia Eyes: no blurry vision, no xerophthalmia ENT: no sore throat, no nodules palpated in neck, no dysphagia, no odynophagia, no hoarseness Cardiovascular: no CP/no SOB/no palpitations/no leg swelling, + B arm swelling after lymphectomy  after  mastectomy Respiratory: no cough/no SOB/no wheezing Gastrointestinal: no N/no V/no D/no C/no acid reflux Musculoskeletal: no muscle aches/no joint aches Skin: no rashes, no hair loss Neurological: no tremors/+ numbness/+ tingling/no dizziness  I reviewed pt's medications, allergies, PMH, social hx, family hx, and changes were documented in the history of present illness. Otherwise, unchanged from my initial visit note.  Past Medical History:  Diagnosis Date  . Allergic rhinitis, cause unspecified   . Essential hypertension, benign   . Extrinsic asthma, unspecified   . Generalized osteoarthrosis, involving multiple sites   . Insomnia, unspecified   . Migraine, unspecified, without mention of intractable migraine without mention of status migrainosus   . Pityriasis rosea   . Pure hypercholesterolemia   . Type I (juvenile type) diabetes mellitus without mention of complication, not stated as uncontrolled   . Unspecified sleep apnea    Past Surgical History:  Procedure Laterality Date  . BREAST REDUCTION SURGERY  2005  . CHOLECYSTECTOMY  2000  . RHINOPLASTY  2005  . SPINAL FUSION  2006   L5-S1 spinal fusion   History   Social History  . Marital Status: Single    Spouse Name:  N/A  . Number of Children: 0  . Years of Education: 14   Occupational History  . Prev. EMS, now disabled   Social History Main Topics  . Smoking status: Never Smoker   . Smokeless tobacco: Not on file  . Alcohol Use: No  . Drug Use: No   Current Outpatient Medications on File Prior to Visit  Medication Sig Dispense Refill  . ACCU-CHEK FASTCLIX LANCETS MISC USE TO CHECK BLOOD SUGAR 4 TIMES A DAY 300 each 3  . albuterol (PROAIR HFA) 108 (90 BASE) MCG/ACT inhaler Inhale 1 puff into the lungs every 4 (four) hours as needed for wheezing.     Marland Kitchen albuterol (PROVENTIL) (2.5 MG/3ML) 0.083% nebulizer solution Take 2.5 mg by nebulization every 6 (six) hours as needed for wheezing.     Marland Kitchen atorvastatin  (LIPITOR) 10 MG tablet Take 10 mg by mouth daily.   0  . CALCIUM PO Take 1 tablet by mouth daily.    . Continuous Blood Gluc Receiver (FREESTYLE LIBRE 2 READER) DEVI 1 each by Does not apply route daily. 1 each 0  . Continuous Blood Gluc Sensor (FREESTYLE LIBRE 2 SENSOR) MISC 1 each by Does not apply route every 14 (fourteen) days. 6 each 3  . glucagon (GLUCAGON EMERGENCY) 1 MG injection Inject 1 mg into the vein once as needed for up to 1 dose. 1 each 0  . glucose blood (ACCU-CHEK GUIDE) test strip Use as instructed 4x a day 300 each 3  . Insulin Degludec (TRESIBA FLEXTOUCH) 200 UNIT/ML SOPN Inject 38-40 Units into the skin daily. (Patient not taking: Reported on 05/16/2020) 3 pen 3  . Insulin Detemir (LEVEMIR FLEXTOUCH) 100 UNIT/ML Pen ADMINISTER 42 UNITS UNDER THE SKIN EVERY MORNING (Patient not taking: Reported on 05/16/2020) 40 mL 1  . Insulin Pen Needle (B-D ULTRAFINE III SHORT PEN) 31G X 8 MM MISC USE AS DIRECTED(EIGHT TIMES DAILY) 500 each 3  . Insulin Syringe-Needle U-100 31G X 5/16" 0.3 ML MISC Use to inject insulin 3 times daily as directed. 270 each 5  . LANTUS SOLOSTAR 100 UNIT/ML Solostar Pen ADMINISTER 25 UNITS UNDER THE SKIN TWICE DAILY 15 mL 1  . lisinopril (PRINIVIL,ZESTRIL) 10 MG tablet Take 10 mg by mouth daily.      Marland Kitchen loratadine (CLARITIN) 10 MG tablet Take 10 mg by mouth daily.      . Multiple Vitamin (MULTI-DAY PO) Take 1 tablet by mouth daily.      Marland Kitchen NOVOLOG FLEXPEN 100 UNIT/ML FlexPen INJECT 0 TO 50 UNITS UNDER THE SKIN PER SLIDING SCALE FOUR TIMES DAILY WITH MAX DAILY DOSE OF 200 UNITS 180 mL 3  . PULMICORT 0.5 MG/2ML nebulizer solution Take 2 mLs by nebulization daily as needed for wheezing.    . rizatriptan (MAXALT-MLT) 10 MG disintegrating tablet Take at onset of headache, May repeat in 2 hours if needed but no more than 2 in 24 hours    . tiZANidine (ZANAFLEX) 2 MG tablet Take 2 mg by mouth every 8 (eight) hours as needed for muscle spasms.     . traMADol (ULTRAM) 50  MG tablet Take 100 mg by mouth at bedtime.     . vitamin C (ASCORBIC ACID) 500 MG tablet Take 500 mg by mouth daily.      . Vitamin D, Ergocalciferol, (DRISDOL) 1.25 MG (50000 UNIT) CAPS capsule TAKE 1 CAPSULE BY MOUTH DAILY 90 capsule 2   No current facility-administered medications on file prior to visit.   Allergies  Allergen  Reactions  . Fenofibrate Other (See Comments)    Severe leg pain  . Humalog [Insulin Lispro] Other (See Comments)    Drug tolerance >> ineffectiveness! Pt needs NovoLog instead!  Marland Kitchen Morphine And Related   . Penicillins    Family History  Problem Relation Age of Onset  . Diabetes Mother   . Hypertension Mother   . Hyperlipidemia Mother   . Hypertension Father   . Hyperlipidemia Father   . Diabetes Maternal Grandmother   . Cancer Maternal Grandmother        Breast Cancer  . Hypertension Maternal Grandfather   . Heart disease Maternal Grandfather   . Heart disease Paternal Grandfather   . Cancer Other        Ovarian Cancer-Grandparent   PE: BP 128/90 (BP Location: Right Arm, Patient Position: Sitting, Cuff Size: Normal)   Pulse 97   Ht 5' 8.5" (1.74 m)   Wt (!) 346 lb 12.8 oz (157.3 kg)   SpO2 99%   BMI 51.96 kg/m  Body mass index is 51.96 kg/m. Wt Readings from Last 3 Encounters:  11/14/20 (!) 346 lb 12.8 oz (157.3 kg)  05/16/20 (!) 340 lb 12.8 oz (154.6 kg)  09/15/18 (!) 314 lb (142.4 kg)   Constitutional: overweight, in NAD Eyes: PERRLA, EOMI, no exophthalmos ENT: moist mucous membranes, no thyromegaly, no cervical lymphadenopathy Cardiovascular: tachycardia, RR, No MRG Respiratory: CTA B Gastrointestinal: abdomen soft, NT, ND, BS+ Musculoskeletal: no deformities, strength intact in all 4 Skin: moist, warm, no rashes Neurological: no tremor with outstretched hands, DTR normal in all 4  ASSESSMENT: 1. DM2, insulin-dependent, uncontrolled, with complications - PN  - Patient had a lot of problems controlling his diabetes over the years  due to in predictability of retaining the food he ate in the setting of autonomic GI neuropathy.    Humalog did not work for him >> sugars >200 constantly. Since he cannot use Humalog, we sent and then re-sent a PA for NovoLog (last form sent in 07/2015). NovoLog was finally approved, but this form has to be sent every year!  2. Vit D def  PLAN:  1. Patient with improved diabetes control in the last few years, but previously with worse control after he developed a parastomal hernia and was not eating well due to pain and missing insulin doses.  However, lately, and especially after he obtained the freestyle libre CGM, sugars are much better.  At last visit, HbA1c was 5.7%, improved.  At that time, he had quite a few low blood sugars but very mild, and he was mentioning that whenever he checked with his glucometer, sugars were not quite at low.  He was delaying meals and was very busy at that time.  We did not change his regimen but we discussed about possibly decreasing the Lantus dose if sugars continue to drop. CGM interpretation: -At today's visit, we reviewed his CGM downloads: It appears that 87% of values are in target range (goal >70%), while 9% are higher than 180 (goal <25%), and 4% are lower than 70 (goal <4%).  The calculated average blood sugar is 126.  The projected HbA1c for the next 3 months (GMI) is 6.3%. -Reviewing the CGM trends, sugars are quite well controlled, with the majority of the blood sugars within target range.  She has slightly more variable blood sugars around dinnertime, but overall, they are still at goal.  She has occasional higher blood sugars after dinner.  We discussed about strengthening his ICR if  he reintroduces carbs.  For now, we can continue with the current regimen -We reviewed together his latest HbA1c from 10/29/2020, which was still at goal, only slightly higher than before, at 6.0% -We discussed about the insulin regimen in the perioperative period (see  below).  He plans to have hysterectomy during the summer after playing in the church musical - I suggested to:  Patient Instructions  Please continue: - Lantus 24 units in am and 26 units at night  - NovoLog:  - ICR 1:4, but 1:2 for starches if you reintroduce them  - target: 150  - ISF: 150-200: 4 units, 200-230: 8 units, 230-and higher: 10 units  Please use only Lantus and correction Novolog in the day of the surgery until you can eat well, then resume Mealtime Novolog.  Please come back for a follow-up appointment in 6 months.    - advised to check sugars at different times of the day - 4x a day, rotating check times - advised for yearly eye exams >> he is UTD - return to clinic in 6 months  2. HL -Reviewed latest lipid panel from earlier this month, fractions are at goal -He tried fenofibrate in the past but had side effects.  Now on omega-3 fatty acids. -he is on testosterone supplementation, which can increase her LDL  3. Vit D def -She continues on 50,000 units of ergocalciferol daily -Her vitamin D level was normal in 01/2020 and she had another vitamin D level checked earlier this month and this was at goal per review of records  Carlus Pavlov, MD PhD [facility] Endocrinology

## 2020-11-14 NOTE — Patient Instructions (Addendum)
Please continue: - Lantus 24 units in am and 26 units at night  - NovoLog:  - ICR 1:4, but 1:2 for starches if you reintroduce them  - target: 150  - ISF: 150-200: 4 units, 200-230: 8 units, 230-and higher: 10 units  Please use only Lantus and correction Novolog in the day of the surgery until you can eat well, then resume Mealtime Novolog.  Please come back for a follow-up appointment in 6 months.

## 2020-11-28 ENCOUNTER — Other Ambulatory Visit: Payer: Self-pay | Admitting: Internal Medicine

## 2020-11-28 DIAGNOSIS — IMO0002 Reserved for concepts with insufficient information to code with codable children: Secondary | ICD-10-CM

## 2020-11-28 DIAGNOSIS — E1165 Type 2 diabetes mellitus with hyperglycemia: Secondary | ICD-10-CM

## 2020-11-28 NOTE — Telephone Encounter (Signed)
Pt called to get a refill of the medication below, but I don't see it on his med list. Pt says this medication is just for emergency, he hasn't had it filled in about a year but he is about to go out of town so he would like to have it for that.  MEDICATION: glucagon  PHARMACY:   [facility] [phone] - [place], [place] - [address] Phone:  [phone]  Fax:  [phone]      HAS THE PATIENT CONTACTED THEIR PHARMACY?  no  IS THIS A 90 DAY SUPPLY :  no  IS PATIENT OUT OF MEDICATION: yes  IF NOT; HOW MUCH IS LEFT:   LAST APPOINTMENT DATE: @4 /22/2022  NEXT APPOINTMENT DATE:@10 /24/2022  DO WE HAVE YOUR PERMISSION TO LEAVE A DETAILED MESSAGE?:  OTHER COMMENTS:  Only use in case of emergency   **Let patient know to contact pharmacy at the end of the day to make sure medication is ready. **  ** Please notify patient to allow 48-72 hours to process**  **Encourage patient to contact the pharmacy for refills or they can request refills through [facility]**

## 2020-12-01 MED ORDER — GLUCAGON EMERGENCY 1 MG/ML IJ SOLR: INTRAMUSCULAR | 99 refills | Status: DC

## 2020-12-01 NOTE — Telephone Encounter (Signed)
Yes, correct dose - for these, refills should also be PRN

## 2021-01-02 ENCOUNTER — Telehealth: Payer: Self-pay | Admitting: Internal Medicine

## 2021-01-02 NOTE — Telephone Encounter (Signed)
Pt called regarding the refill on his medications. Pt states [facility] has been trying to get a response from [place] through phone and fax since 5/31 to refill.    Pt requests a refill of his CGM supplies be sent to:  [email or web address] - [place], [place] - [address] Phone:  [phone]  Fax:  [phone]

## 2021-01-06 NOTE — Telephone Encounter (Signed)
Patient called about [facility] cancelling his order for CGM supplies because we have not sent requested chart notes, CMN, etc. Please review and advise - patient's number for call back is [phone]

## 2021-01-07 ENCOUNTER — Encounter: Payer: Self-pay | Admitting: Internal Medicine

## 2021-01-07 DIAGNOSIS — E1142 Type 2 diabetes mellitus with diabetic polyneuropathy: Secondary | ICD-10-CM

## 2021-01-07 DIAGNOSIS — IMO0002 Reserved for concepts with insufficient information to code with codable children: Secondary | ICD-10-CM

## 2021-01-08 ENCOUNTER — Other Ambulatory Visit: Payer: Self-pay

## 2021-01-08 DIAGNOSIS — IMO0002 Reserved for concepts with insufficient information to code with codable children: Secondary | ICD-10-CM

## 2021-01-16 NOTE — Telephone Encounter (Signed)
Called patient also. Pt states that he received call from [facility] yesterday and they have what they need.

## 2021-01-16 NOTE — Telephone Encounter (Signed)
Form was faxed over with all documentation on 01/12/21.

## 2021-01-19 NOTE — Telephone Encounter (Signed)
Patient called to advise they just received a call from [facility] is stating that they do not have the RX just the chart notes.  Patient is requesting that we resend the packet to [facility]. Call patient at [phone] with any questions or concerns

## 2021-01-20 NOTE — Telephone Encounter (Signed)
[facility] called checking on status of fax. While Alycia was going to check on if we had it, representative hung up.   I was going to let him know, we sent the chart notes to whatever fax number was on the form they sent [place]. But we did not send an Rx, we don't have one. They need to send [place] a written order for Dr to sign so we can send back. FYI if patient or [facility] calls again.

## 2021-01-27 ENCOUNTER — Encounter: Payer: Self-pay | Admitting: Internal Medicine

## 2021-01-27 DIAGNOSIS — IMO0002 Reserved for concepts with insufficient information to code with codable children: Secondary | ICD-10-CM

## 2021-01-27 DIAGNOSIS — E1142 Type 2 diabetes mellitus with diabetic polyneuropathy: Secondary | ICD-10-CM

## 2021-01-30 MED ORDER — ACCU-CHEK GUIDE VI STRP: ORAL_STRIP | 3 refills | Status: DC

## 2021-01-30 MED ORDER — ACCU-CHEK FASTCLIX LANCETS MISC: 3 refills | Status: AC

## 2021-02-05 MED ORDER — ACCU-CHEK GUIDE W/DEVICE KIT: PACK | 0 refills | Status: DC

## 2021-02-05 NOTE — Addendum Note (Signed)
Addended by: Kenyon Ana on: 02/05/2021 05:26 PM   Modules accepted: Orders

## 2021-02-10 NOTE — Telephone Encounter (Signed)
Pt called stating Corliss Blacker has just cancelled the order for the [facility] since we were not able to send in the prescription. I did let the pt know we have tried to fax that over to [facility] but it seems they must have not received that prescription. Pt is requesting we resend the prescription to [facility].

## 2021-02-12 MED ORDER — FREESTYLE LIBRE 2 SENSOR MISC: 1.0000 | 3 refills | Status: DC

## 2021-02-12 NOTE — Telephone Encounter (Signed)
Pt updated via mychart message

## 2021-03-12 DIAGNOSIS — Z4889 Encounter for other specified surgical aftercare: Secondary | ICD-10-CM

## 2021-03-12 HISTORY — DX: Encounter for other specified surgical aftercare: Z48.89

## 2021-04-15 ENCOUNTER — Other Ambulatory Visit: Payer: Self-pay | Admitting: Internal Medicine

## 2021-04-28 ENCOUNTER — Other Ambulatory Visit: Payer: Self-pay | Admitting: Internal Medicine

## 2021-05-08 ENCOUNTER — Other Ambulatory Visit: Payer: Self-pay | Admitting: Internal Medicine

## 2021-05-18 ENCOUNTER — Encounter: Payer: Self-pay | Admitting: Internal Medicine

## 2021-05-18 ENCOUNTER — Other Ambulatory Visit: Payer: Self-pay

## 2021-05-18 ENCOUNTER — Ambulatory Visit (INDEPENDENT_AMBULATORY_CARE_PROVIDER_SITE_OTHER): Payer: Medicare Other | Admitting: Internal Medicine

## 2021-05-18 VITALS — BP 126/82 | HR 91 | Ht 68.5 in | Wt 334.8 lb

## 2021-05-18 DIAGNOSIS — E559 Vitamin D deficiency, unspecified: Secondary | ICD-10-CM | POA: Diagnosis not present

## 2021-05-18 DIAGNOSIS — E7849 Other hyperlipidemia: Secondary | ICD-10-CM

## 2021-05-18 DIAGNOSIS — E1142 Type 2 diabetes mellitus with diabetic polyneuropathy: Secondary | ICD-10-CM

## 2021-05-18 LAB — POCT GLYCOSYLATED HEMOGLOBIN (HGB A1C): Hemoglobin A1C: 7.2 % — AB (ref 4.0–5.6)

## 2021-05-18 NOTE — Progress Notes (Signed)
Patient ID: Gavin Smith, adult   DOB: 1976/07/08, 45 y.o.   MRN: [id number]  This visit occurred during the SARS-CoV-2 public health emergency.  Safety protocols were in place, including screening questions prior to the visit, additional usage of staff PPE, and extensive cleaning of exam room while observing appropriate contact time as indicated for disinfecting solutions.   HPI: Gavin Smith is a 45 y.o.-year-old adult, initially referred by his PCP, Dr. Shirleen Smith, presenting for follow-up for DM2, dx in 4 (45 y/o), insulin-dependent, uncontrolled, wit complications (PN). Last visit 6 months ago. Now seeing Dr. Jenean Smith.  Interim history: He continues on testosterone for male to male transition.  He felt much better after starting this.  Had B mastectomy in 06/2019.  This was an extensive surgery.  He also had hysterectomy in 02/2021. He continues to go to the gym: Walks, bicycle, strength. He eliminated starches (bread, rice, pasta) and diet sodas. She lost 31 lbs overall per her scale at home. She gets urinary retention , a UTI as her pelvic floor mm are not function >> will see a urologist. Tried ABx. He was also on steroids 2 mo ago >> sugars higher.  Had Covid at the end on 01/2021.  DM2: Reviewed history: He has a h/o autonomic neuropathy of the GI tract (dx in 1986, when he was 45 y/o - not related to DM - severe diarrhea: "food goes through me"), seeing Dr. [place] >> permanent colostomy - had sx in 09/2014.   Reviewed HbA1c levels: 10/29/2020: HbA1c 6.0% Lab Results  Component Value Date   HGBA1C 5.7 (A) 05/16/2020   HGBA1C 6.0 03/20/2019   HGBA1C 7.0 (A) 09/15/2018  02/20/2020: HbA1c 6.0%. 10/19/2019: HbA1c 6.7% 12/07/2018: HbA1c 6.8% 11/10/2016:The HbA1c calculated from the fructosamine was 7.3%, better than the measured HbA1c! 02/28/2015: HbA1c 6.9% 10/21/2014: HbA1c 6.9% - after changing from Humalog to NovoLog  07/23/2014: HbA1c 10.9% ICA Abs negative  (01/31/2012) C-pp 5.0, Glu 218 (01/25/2012)  She had high HbA1c levels in the past as his insurance did not approve NovoLog and was forced to change to Humalog/Humulin.  Since then, we need to send a preauthorization for NovoLog to her insurance every year.  He patient previously is on: - Levemir 22 units 2x a day >> 8-10 am: 34-40 units in am >> 22 >> Lantus 24 units in am and 26 units at night - NovoLog:  - ICR 1:4,  (eliminated starches)  - target: 150  - ISF: 150-200: 4 units, 200-230: 8 units, 230-and higher: 10 units He cannot digest pills;  tried oral meds in the past >> "they went through me". He also tried Lantus >> this did not work for him >> sugars unchanged despite increasing the dose.  He tried [place] but sugars were higher.  He checks her sugars more than 4 times a day with the freestyle libre 2 CGM:   Previously:   Previously:   Lowest blood sugar: 42, 59 - seldom >> 57 (glucometer) - gastroenteritis >> 60s  >> 60s. She has hypoglycemia awareness in the 60s. Highest sugars recently 400 (after breast sx), most below 170 >> 280 >> 200s >> 300s.  -No CKD: 10/29/2020: Glucose 146, BUN/creatinine 16/1.06, GFR 67, ACR 7 02/20/2020:Glucose 127, BUN/creatinine 11/1.08, GFR 63 10/19/2019: Glucose 174, BUN/creatinine 10/1.10, GFR 63 06/16/2018: glucose 120, BUN/creatinine 14/0.84 Lab Results  Component Value Date   BUN 16 07/15/2017   Lab Results  Component Value Date   CREATININE 0.81 07/15/2017  02/03/2016: 13/0.93, Glu 183 07/02/2015: CMP: Normal, except glucose 216, BUN/creatinine 10/0.81, GFR 93, ALT 39 (0-32), bilirubin 1.3 (0-1.2) 07/23/2014: BUN/creatinine 12/0.9 Lab Results  Component Value Date   MICRALBCREAT 0.9 07/15/2017   MICRALBCREAT 0.5 08/12/2015  On lisinopril.  -+ HL; last set of lipids: 10/29/2020: 162/127/57/83 02/20/2020: 145/173/47/75 10/19/2019: 149/155/50/72 Lab Results  Component Value Date   CHOL 180 03/20/2019   HDL 48 03/20/2019    LDLCALC 97 03/20/2019   LDLDIRECT 119.0 07/15/2017   TRIG 173 (A) 03/20/2019   CHOLHDL 5 07/15/2017  02/03/2016: 205/284/55/93 07/02/2015: 206/229/63/97  07/23/2014: 324/868/42/171 AST/ALT: 121/76 She was taken off statins due to hemochromatosis.she had muscle aches on fenofibrate.  Currently on omega-3 fatty acids.  - last eye exam was in 06/2020: No DR reportedly [facility]).   -+ he has Numbness and tingling in his feet - and L hand numbness and pain.  He sees a podiatrist and neurology.  On Neurontin cream and Cymbalta - not on these anymore.  Vitamin D def.:  Reviewed vitamin D levels: 10/29/2020: Vitamin D 55.9 02/20/2020: Vitamin D 47.9 Lab Results  Component Value Date   VD25OH 64.54 09/15/2018   VD25OH 31.25 07/15/2017   VD25OH 45.42 11/10/2016   VD25OH 35.30 05/31/2016   VD25OH 23.86 (L) 08/12/2015   VD25OH 19.32 (L) 11/28/2014  Vit D 23.5 in 02/03/2016 - on Ergocalciferol 50,000 IU daily. Vit D 13.9 in 06/2014 - despite being on Ergocalciferol 50,000 IU 2x a week. Vit D 19.3 in 11/2014 - repeat, on Ergocalciferol 50,000 IU 2x a week >> dose increased to 4 times a week Vit D 23.86 in 07/2015 - on ergocalciferol 50,000 IU 4x a week >> dose increased to daily   He continues on ergocalciferol 50,000 units daily.  Latest TSH was normal: 10/29/2020: TSH 1.83 Lab Results  Component Value Date   TSH 0.762 07/23/2016   He also has hemochromatosis, allergy-triggered asthma, HTN, OSA-on CPAP, transaminitis, GERD, depression.  He  is on Depo-Provera.   He usually comes to the appointments with her service dog, Marily Memos,  who played in [place] musical and also in [place].  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  ROS: + See HPI Cardiovascular: no CP/no SOB/no palpitations/no leg swelling, + B arm swelling after lymphectomy after  mastectomy Neurological: no tremors/+ numbness/+ tingling/no dizziness  I reviewed pt's medications, allergies, PMH,  social hx, family hx, and changes were documented in the history of present illness. Otherwise, unchanged from my initial visit note.  Past Medical History:  Diagnosis Date   Allergic rhinitis, cause unspecified    Essential hypertension, benign    Extrinsic asthma, unspecified    Generalized osteoarthrosis, involving multiple sites    Insomnia, unspecified    Migraine, unspecified, without mention of intractable migraine without mention of status migrainosus    Pityriasis rosea    Pure hypercholesterolemia    Type I (juvenile type) diabetes mellitus without mention of complication, not stated as uncontrolled    Unspecified sleep apnea    Past Surgical History:  Procedure Laterality Date   BREAST REDUCTION SURGERY  2005   CHOLECYSTECTOMY  2000   RHINOPLASTY  2005   SPINAL FUSION  2006   L5-S1 spinal fusion   History   Social History   Marital Status: Single    Spouse Name: N/A   Number of Children: 0   Years of Education: 73   Occupational History   Prev. EMS, now disabled  Social History Main Topics   Smoking status: Never Smoker    Smokeless tobacco: Not on file   Alcohol Use: No   Drug Use: No   Current Outpatient Medications on File Prior to Visit  Medication Sig Dispense Refill   Accu-Chek FastClix Lancets MISC USE TO CHECK BLOOD SUGAR 4 TIMES A DAY 300 each 3   albuterol (PROAIR HFA) 108 (90 BASE) MCG/ACT inhaler Inhale 1 puff into the lungs every 4 (four) hours as needed for wheezing.      albuterol (PROVENTIL) (2.5 MG/3ML) 0.083% nebulizer solution Take 2.5 mg by nebulization every 6 (six) hours as needed for wheezing.      atorvastatin (LIPITOR) 10 MG tablet Take 10 mg by mouth daily.   0   BD INSULIN SYRINGE U/F 1/2UNIT 31G X 5/16" 0.3 ML MISC USE TO INJECT INSULIN THREE TIMES DAILY AS NEEDED 270 each 5   Blood Glucose Monitoring Suppl (ACCU-CHEK GUIDE) w/Device KIT Use as instructed to check blood sugar 1 kit 0   CALCIUM PO Take 1 tablet by mouth daily.      Continuous Blood Gluc Receiver (FREESTYLE LIBRE 2 READER) DEVI 1 each by Does not apply route daily. 1 each 0   Continuous Blood Gluc Sensor (FREESTYLE LIBRE 2 SENSOR) MISC 1 each by Does not apply route every 14 (fourteen) days. 6 each 3   Glucagon HCl (GLUCAGON EMERGENCY) 1 MG/ML SOLR Use as directed in case of emergeny 1 each prn   glucose blood (ACCU-CHEK GUIDE) test strip Use as instructed 4x a day 300 each 3   insulin glargine (LANTUS SOLOSTAR) 100 UNIT/ML Solostar Pen ADMINISTER 24 TO 26 UNITS UNDER THE SKIN TWICE DAILY 30 mL 3   Insulin Pen Needle (B-D ULTRAFINE III SHORT PEN) 31G X 8 MM MISC USE AS DIRECTED(EIGHT TIMES DAILY) 500 each 3   lisinopril (PRINIVIL,ZESTRIL) 10 MG tablet Take 10 mg by mouth daily.       Multiple Vitamin (MULTI-DAY PO) Take 1 tablet by mouth daily.       NOVOLOG FLEXPEN 100 UNIT/ML FlexPen INJECT 0 TO 50 UNITS UNDER THE SKIN PER SLIDING SCALE FOUR TIMES DAILY WITH MAX DAILY DOSE OF 200 UNITS 180 mL 0   PULMICORT 0.5 MG/2ML nebulizer solution Take 2 mLs by nebulization daily as needed for wheezing.     rizatriptan (MAXALT-MLT) 10 MG disintegrating tablet Take at onset of headache, May repeat in 2 hours if needed but no more than 2 in 24 hours     tiZANidine (ZANAFLEX) 2 MG tablet Take 2 mg by mouth every 8 (eight) hours as needed for muscle spasms.      traMADol (ULTRAM) 50 MG tablet Take 100 mg by mouth at bedtime.      vitamin C (ASCORBIC ACID) 500 MG tablet Take 500 mg by mouth daily.       Vitamin D, Ergocalciferol, (DRISDOL) 1.25 MG (50000 UNIT) CAPS capsule TAKE 1 CAPSULE BY MOUTH DAILY 90 capsule 2   No current facility-administered medications on file prior to visit.   Allergies  Allergen Reactions   Fenofibrate Other (See Comments)    Severe leg pain   Humalog [Insulin Lispro] Other (See Comments)    Drug tolerance >> ineffectiveness! Pt needs NovoLog instead!   Morphine And Related    Penicillins    Family History  Problem Relation Age of  Onset   Diabetes Mother    Hypertension Mother    Hyperlipidemia Mother    Hypertension Father  Hyperlipidemia Father    Diabetes Maternal Grandmother    Cancer Maternal Grandmother        Breast Cancer   Hypertension Maternal Grandfather    Heart disease Maternal Grandfather    Heart disease Paternal Grandfather    Cancer Other        Ovarian Cancer-Grandparent   PE: BP 126/82 (BP Location: Right Arm, Patient Position: Sitting, Cuff Size: Normal)   Pulse 91   Ht 5' 8.5" (1.74 m)   Wt (!) 334 lb 12.8 oz (151.9 kg)   SpO2 98%   BMI 50.17 kg/m  Body mass index is 50.17 kg/m. Wt Readings from Last 3 Encounters:  05/18/21 (!) 334 lb 12.8 oz (151.9 kg)  11/14/20 (!) 346 lb 12.8 oz (157.3 kg)  05/16/20 (!) 340 lb 12.8 oz (154.6 kg)   Constitutional: overweight, in NAD Eyes: PERRLA, EOMI, no exophthalmos ENT: moist mucous membranes, no thyromegaly, no cervical lymphadenopathy Cardiovascular: tachycardia, RR, No MRG Respiratory: CTA B Gastrointestinal: abdomen soft, NT, ND, BS+ Musculoskeletal: no deformities, strength intact in all 4 Skin: moist, warm, no rashes Neurological: no tremor with outstretched hands, DTR normal in all 4  ASSESSMENT: 1. DM2, insulin-dependent, uncontrolled, with complications - PN  - Patient had a lot of problems controlling his diabetes over the years due to unpredictability of retaining the food he ate in the setting of autonomic GI neuropathy.    Humalog did not work for him >> sugars >200 constantly. Since he cannot use Humalog, we sent and then re-sent a PA for NovoLog. NovoLog was finally approved, but this form has to be sent every year!  2. Vit D def  PLAN:  1. Patient with improved diabetes control in the last few years, but previously very poor control after he developed a parastomal hernia and was not eating well and missing insulin doses.  However, lately, especially after he obtained a freestyle libre CGM, sugars improved and HbA1c  also improved, to 5.7%.  Latest HbA1c is from before last visit, slightly higher, at 6%.  At that time, sugars were well controlled, with the majority of the blood sugars within target range.  He had slightly more variable blood sugars around dinnertime but overall, still at goal.  We did not change his regimen at that time but I gave him instructions about management of his diabetes around the time of his hysterectomy, which he had in 02/2021. CGM interpretation: -At today's visit, we reviewed his CGM downloads: It appears that 55% of values are in target range (goal >70%), while 44% are higher than 180 (goal <25%), and 1% are lower than 70 (goal <4%).  The calculated average blood sugar is 175.  The projected HbA1c for the next 3 months (GMI) is 7.5%. -Reviewing the CGM trends, it appears that his sugars are dropping during the night but upon questioning, this is due to correcting high blood sugars at that time.  Sugars are actually increasing after the first meal of the day and they remain elevated throughout the day and increasing more after dinner.  He feels that his sugars are unpredictable and more uncontrolled due to UTIs and his pelvic floor dysfunction.  He was also on steroids recently.  He is waiting for an appointment with urology.  Until then, we discussed about increasing the amount of insulin that he gets for the meals by strengthening the ICR ratio for each meal.  I do not feel that we absolutely need to change his Lantus doses for now.  I am hoping that his sugars overnight will not decrease as much if he does not have to do corrections at bedtime, but if they continue to do, will need to decrease his Lantus at night.   -Increasing his insulin with meals for now is most likely a provisional method, I am hoping that he can return to the previous excellent control from last visit after his UTIs and urinary retention resolve. - I suggested to:  Patient Instructions  Please continue: - Lantus  24 units in am and 26 units at night  - NovoLog:  - ICR 1:3-4, but 1:2 for starches if you reintroduce them  - target: 150  - ISF: 150-200: 4 units, 200-230: 8 units, 230-and higher: 10 units  Please come back for a follow-up appointment in 6 months.    - we checked his HbA1c: 7.2% (higher) - advised to check sugars at different times of the day - 4x a day, rotating check times - advised for yearly eye exams >> he is UTD - lost 12 lbs since last OV - return to clinic in 6 months  2. HL -Reviewed latest lipid panel from before last visit: Fractions were at goal -He tried fenofibrate in the past but had side effects.  Now on omega-3 fatty acids  3. Vit D def -She continues Ergocalciferol 50,000 units daily -Vitamin D level was normal earlier in the year -He will get labs again before our next visit and we will review them then.  Philemon Kingdom, MD PhD [facility]

## 2021-05-18 NOTE — Patient Instructions (Addendum)
Please continue: - Lantus 24 units in am and 26 units at night  - NovoLog:  - ICR 1:3-4, but 1:2 for starches if you reintroduce them  - target: 150  - ISF: 150-200: 4 units, 200-230: 8 units, 230-and higher: 10 units  Please come back for a follow-up appointment in 6 months.

## 2021-07-09 ENCOUNTER — Other Ambulatory Visit: Payer: Self-pay

## 2021-07-09 DIAGNOSIS — G4730 Sleep apnea, unspecified: Secondary | ICD-10-CM | POA: Insufficient documentation

## 2021-07-09 DIAGNOSIS — G7100 Muscular dystrophy, unspecified: Secondary | ICD-10-CM

## 2021-07-09 DIAGNOSIS — G4700 Insomnia, unspecified: Secondary | ICD-10-CM | POA: Insufficient documentation

## 2021-07-09 DIAGNOSIS — Z6841 Body Mass Index (BMI) 40.0 and over, adult: Secondary | ICD-10-CM | POA: Insufficient documentation

## 2021-07-09 DIAGNOSIS — Z933 Colostomy status: Secondary | ICD-10-CM | POA: Insufficient documentation

## 2021-07-09 DIAGNOSIS — I10 Essential (primary) hypertension: Secondary | ICD-10-CM | POA: Insufficient documentation

## 2021-07-09 DIAGNOSIS — E785 Hyperlipidemia, unspecified: Secondary | ICD-10-CM | POA: Insufficient documentation

## 2021-07-09 DIAGNOSIS — G5622 Lesion of ulnar nerve, left upper limb: Secondary | ICD-10-CM | POA: Insufficient documentation

## 2021-07-09 DIAGNOSIS — Z79890 Hormone replacement therapy (postmenopausal): Secondary | ICD-10-CM | POA: Insufficient documentation

## 2021-07-09 DIAGNOSIS — J309 Allergic rhinitis, unspecified: Secondary | ICD-10-CM | POA: Insufficient documentation

## 2021-07-09 DIAGNOSIS — R339 Retention of urine, unspecified: Secondary | ICD-10-CM | POA: Insufficient documentation

## 2021-07-09 DIAGNOSIS — L42 Pityriasis rosea: Secondary | ICD-10-CM | POA: Insufficient documentation

## 2021-07-09 DIAGNOSIS — E1140 Type 2 diabetes mellitus with diabetic neuropathy, unspecified: Secondary | ICD-10-CM | POA: Insufficient documentation

## 2021-07-09 DIAGNOSIS — E7800 Pure hypercholesterolemia, unspecified: Secondary | ICD-10-CM | POA: Insufficient documentation

## 2021-07-09 DIAGNOSIS — K9049 Malabsorption due to intolerance, not elsewhere classified: Secondary | ICD-10-CM | POA: Insufficient documentation

## 2021-07-09 HISTORY — DX: Muscular dystrophy, unspecified: G71.00

## 2021-07-14 ENCOUNTER — Other Ambulatory Visit (HOSPITAL_COMMUNITY): Payer: Self-pay | Admitting: Urology

## 2021-07-14 ENCOUNTER — Encounter (HOSPITAL_COMMUNITY): Payer: Self-pay | Admitting: Radiology

## 2021-07-14 DIAGNOSIS — N312 Flaccid neuropathic bladder, not elsewhere classified: Secondary | ICD-10-CM

## 2021-07-14 NOTE — Progress Notes (Signed)
Patient Name  Diar, Berkel Legal Sex  Male DOB  Aug 10, 1975 SSN  [id number] Address  [address]  [place] [address] Phone  [phone] (Home) *Preferred*  [phone] (Mobile)    RE: CT IMAGE GUIDED DRAINAGE BY PERCUTANEOUS CATHETER Received: Today Oley Balm, MD  Servando Salina Ok   No imaging  Needs Foley in place pre procedure   DDH        Previous Messages   ----- Message -----  From: Henry Russel D  Sent: 07/14/2021  11:21 AM EST  To: [facility] Procedure Requests  Subject: CT IMAGE GUIDED DRAINAGE BY PERCUTANEOUS CAT*   Procedure:   CT IMAGE GUIDED DRAINAGE BY PERCUTANEOUS CATHETER   Reason:  areflexic bladder, suprapubic tube placement   History:  no prior imaging   Provider:  Rutherford Nail D   Provider Contact:  [phone]

## 2021-07-23 ENCOUNTER — Other Ambulatory Visit: Payer: Self-pay | Admitting: Student

## 2021-07-24 ENCOUNTER — Ambulatory Visit (HOSPITAL_COMMUNITY)
Admission: RE | Admit: 2021-07-24 | Discharge: 2021-07-24 | Disposition: A | Discharge status: Home / Self Care | Payer: Medicare Other | Source: Ambulatory Visit | Attending: Urology | Admitting: Urology

## 2021-07-24 DIAGNOSIS — Z6841 Body Mass Index (BMI) 40.0 and over, adult: Secondary | ICD-10-CM | POA: Insufficient documentation

## 2021-07-24 DIAGNOSIS — R339 Retention of urine, unspecified: Secondary | ICD-10-CM | POA: Insufficient documentation

## 2021-07-24 DIAGNOSIS — Z933 Colostomy status: Secondary | ICD-10-CM | POA: Diagnosis not present

## 2021-07-24 DIAGNOSIS — F649 Gender identity disorder, unspecified: Secondary | ICD-10-CM | POA: Diagnosis not present

## 2021-07-24 DIAGNOSIS — Z79890 Hormone replacement therapy (postmenopausal): Secondary | ICD-10-CM | POA: Insufficient documentation

## 2021-07-24 DIAGNOSIS — Z87440 Personal history of urinary (tract) infections: Secondary | ICD-10-CM | POA: Insufficient documentation

## 2021-07-24 DIAGNOSIS — E1040 Type 1 diabetes mellitus with diabetic neuropathy, unspecified: Secondary | ICD-10-CM | POA: Diagnosis not present

## 2021-07-24 DIAGNOSIS — F845 Asperger's syndrome: Secondary | ICD-10-CM | POA: Insufficient documentation

## 2021-07-24 DIAGNOSIS — N312 Flaccid neuropathic bladder, not elsewhere classified: Secondary | ICD-10-CM | POA: Insufficient documentation

## 2021-07-24 LAB — CBC
HCT: 50.1 % (ref 39.0–52.0)
Hemoglobin: 17.2 g/dL — ABNORMAL HIGH (ref 13.0–17.0)
MCH: 31.2 pg (ref 26.0–34.0)
MCHC: 34.3 g/dL (ref 30.0–36.0)
MCV: 90.8 fL (ref 80.0–100.0)
Platelets: 223 10*3/uL (ref 150–400)
RBC: 5.52 MIL/uL (ref 4.22–5.81)
RDW: 12.8 % (ref 11.5–15.5)
WBC: 11.2 10*3/uL — ABNORMAL HIGH (ref 4.0–10.5)
nRBC: 0 % (ref 0.0–0.2)

## 2021-07-24 LAB — GLUCOSE, CAPILLARY: Glucose-Capillary: 173 mg/dL — ABNORMAL HIGH (ref 70–99)

## 2021-07-24 LAB — PROTIME-INR
INR: 0.9 (ref 0.8–1.2)
Prothrombin Time: 12.5 seconds (ref 11.4–15.2)

## 2021-07-24 MED ORDER — LIDOCAINE HCL 1 % IJ SOLN
INTRAMUSCULAR | Status: AC
  Administered 2021-07-24: 13:00:00 10 mL via INTRADERMAL
  Filled 2021-07-24: qty 10

## 2021-07-24 MED ORDER — FENTANYL CITRATE (PF) 100 MCG/2ML IJ SOLN
INTRAMUSCULAR | Status: AC
  Filled 2021-07-24: qty 4

## 2021-07-24 MED ORDER — MIDAZOLAM HCL 2 MG/2ML IJ SOLN
INTRAMUSCULAR | Status: AC | PRN
  Administered 2021-07-24 (×4): 1 mg via INTRAVENOUS

## 2021-07-24 MED ORDER — SODIUM CHLORIDE 0.9 % IV SOLN: INTRAVENOUS | Status: DC

## 2021-07-24 MED ORDER — MIDAZOLAM HCL 2 MG/2ML IJ SOLN
INTRAMUSCULAR | Status: AC
  Filled 2021-07-24: qty 4

## 2021-07-24 MED ORDER — FENTANYL CITRATE (PF) 100 MCG/2ML IJ SOLN
INTRAMUSCULAR | Status: AC | PRN
  Administered 2021-07-24 (×4): 50 ug via INTRAVENOUS

## 2021-07-24 NOTE — H&P (Addendum)
Chief Complaint: Patient was seen in consultation today for suprapubic catheter placement  Referring Physician(s): Bell,Eugene D III  Supervising Physician: Malachy Moan  Patient Status: [facility] - Out-pt  History of Present Illness: Gavin Smith is a 45 y.o. male with a medical history significant for Asperger's syndrome, DM1 with neuropathy, morbid obesity, gender dysphoria undergoing male to male transition s/p bilateral mastectomy, hysterectomy; hormone replacement therapy, colostomy, and areflexic bladder with frequent UTIs.   The patient follows with urology and Interventional Radiology has been asked to evaluate this patient for an image-guided suprapubic catheter placement. This case has been reviewed and procedure approved by Dr. Deanne Coffer.   Past Medical History:  Diagnosis Date   Allergic rhinitis    Asperger's syndrome 06/26/2019   Asthma exacerbation 07/22/2016   Carpal tunnel syndrome, bilateral 06/27/2020   Colostomy in place [facility])    Diabetic gastropathy (HCC)    Diabetic neuropathy, type II diabetes mellitus (HCC)    Diabetic peripheral neuropathy (HCC) 07/15/2015   Encounter for postoperative care 03/12/2021   Essential hypertension, benign    Extrinsic asthma, unspecified    Family history of breast cancer gene mutation in first degree relative 05/29/2019   Food intolerance    Gender dysphoria 12/19/2018   Formatting of this note might be different from the original. Trans Male Assessment and Plan  Pronoun Use: he/him Preferred Name: JP Preferred Anatomical Terminology: scientific Legal Documentation Corrected: None  Hormone Start Date: Dec 13, 2018 Current Testosterone Dose: 50 mg cypionate Mode of Delivery (SQ vs IM): IM Injection Schedule (Weekly?  Twice a week?): weekly  Recommended Monitoring S   Generalized osteoarthrosis, involving multiple sites    History of colostomy    Hormone replacement therapy    HTN (hypertension), benign    Hyperlipidemia     IDDM (insulin dependent diabetes mellitus) 06/16/2018   Inappropriate sinus tachycardia    Incisional hernia with obstruction but no gangrene 07/22/2017   Insomnia, unspecified    Lumbar spinal stenosis 07/15/2015   Morbid obesity with BMI of 45.0-49.9, adult (HCC)    Muscular dystrophy (HCC) 07/09/2021   Formatting of this note might be different from the original. diagnosed age 82 yrs   Neuropathy 02/27/2015   Other hyperlipidemia    Pityriasis rosea    Pure hypercholesterolemia    Pure hypercholesterolemia    Sleep apnea    Type 2 diabetes, controlled, with peripheral neuropathy (HCC)    Ulnar neuritis, left    Urinary retention with incomplete bladder emptying    Vitamin D deficiency     Past Surgical History:  Procedure Laterality Date   BREAST REDUCTION SURGERY  2005   CHOLECYSTECTOMY  2000   RHINOPLASTY  2005   SPINAL FUSION  2006   L5-S1 spinal fusion    Allergies: Fenofibrate, Humalog [insulin lispro], Penicillins, and Morphine and related  Medications: Prior to Admission medications   Medication Sig Start Date End Date Taking? Authorizing Provider  Accu-Chek FastClix Lancets MISC USE TO CHECK BLOOD SUGAR 4 TIMES A DAY 01/30/21  Yes Carlus Pavlov, MD  atorvastatin (LIPITOR) 20 MG tablet Take 20 mg by mouth daily. 03/25/21  Yes [provider]  Continuous Blood Gluc Sensor (FREESTYLE LIBRE 2 SENSOR) MISC 1 each by Does not apply route every 14 (fourteen) days. 02/12/21  Yes Carlus Pavlov, MD  glucose blood (ACCU-CHEK GUIDE) test strip Use as instructed 4x a day 01/30/21  Yes Carlus Pavlov, MD  insulin glargine (LANTUS SOLOSTAR) 100 UNIT/ML Solostar Pen ADMINISTER  24 TO 26 UNITS UNDER THE SKIN TWICE DAILY 04/15/21  Yes Carlus Pavlov, MD  lisinopril (PRINIVIL,ZESTRIL) 10 MG tablet Take 10 mg by mouth daily.     Yes [provider]  NOVOLOG FLEXPEN 100 UNIT/ML FlexPen INJECT 0 TO 50 UNITS UNDER THE SKIN PER SLIDING SCALE FOUR TIMES DAILY WITH MAX  DAILY DOSE OF 200 UNITS 04/29/21  Yes Carlus Pavlov, MD  omega-3 acid ethyl esters (LOVAZA) 1 g capsule Take 2 capsules by mouth 2 (two) times daily. 03/18/21  Yes [provider]  sulfamethoxazole-trimethoprim (BACTRIM) 400-80 MG tablet Take 1 tablet by mouth once.   Yes [provider]  testosterone cypionate (DEPOTESTOSTERONE CYPIONATE) 200 MG/ML injection Inject 50 mg into the muscle once a week. 0.25 mL   Yes [provider]  tiZANidine (ZANAFLEX) 2 MG tablet Take 2 mg by mouth every 8 (eight) hours as needed for muscle spasms.    Yes [provider]  traMADol (ULTRAM) 50 MG tablet Take 50-100 mg by mouth every 8 (eight) hours as needed for severe pain. 100 mg every night at bedtime   Yes [provider]  Vitamin D, Ergocalciferol, (DRISDOL) 1.25 MG (50000 UNIT) CAPS capsule TAKE 1 CAPSULE BY MOUTH DAILY 02/18/20  Yes Carlus Pavlov, MD  albuterol (PROVENTIL) (2.5 MG/3ML) 0.083% nebulizer solution Take 2.5 mg by nebulization every 6 (six) hours as needed for wheezing.     [provider]  albuterol (VENTOLIN HFA) 108 (90 Base) MCG/ACT inhaler Inhale 1 puff into the lungs every 4 (four) hours as needed for wheezing.     [provider]  fluticasone (FLONASE) 50 MCG/ACT nasal spray Place 2 sprays into both nostrils 2 (two) times daily as needed for allergies. 03/05/21   [provider]  PULMICORT 0.5 MG/2ML nebulizer solution Take 2 mLs by nebulization daily as needed for wheezing. 04/26/16   [provider]  rizatriptan (MAXALT-MLT) 10 MG disintegrating tablet Take 10 mg by mouth as needed for headache. 10/31/19   [provider]     Family History  Problem Relation Age of Onset   Diabetes Mother    Hypertension Mother    Hyperlipidemia Mother    Hypertension Father    Hyperlipidemia Father    Diabetes Maternal Grandmother    Cancer Maternal Grandmother        Breast Cancer   Hypertension Maternal  Grandfather    Heart disease Maternal Grandfather    Heart disease Paternal Grandfather    Cancer Other        Ovarian Cancer-Grandparent    Social History   Socioeconomic History   Marital status: Single    Spouse name: Not on file   Number of children: 0   Years of education: 14   Highest education level: Not on file  Occupational History   Not on file  Tobacco Use   Smoking status: Never   Smokeless tobacco: Never  Substance and Sexual Activity   Alcohol use: No   Drug use: No   Sexual activity: Not on file  Other Topics Concern   Not on file  Social History Narrative   Not on file   Social Determinants of Health   Financial Resource Strain: Not on file  Food Insecurity: Not on file  Transportation Needs: Not on file  Physical Activity: Not on file  Stress: Not on file  Social Connections: Not on file    Review of Systems: A 12 point ROS discussed and pertinent positives are indicated  in the HPI above.  All other systems are negative.  Review of Systems  Constitutional:  Negative for appetite change and fatigue.  Respiratory:  Negative for cough and shortness of breath.   Cardiovascular:  Negative for chest pain and leg swelling.  Gastrointestinal:  Positive for nausea. Negative for diarrhea and vomiting.  Genitourinary:  Positive for difficulty urinating.  Neurological:  Positive for numbness. Negative for dizziness and headaches.       Peripheral neuropathy   Psychiatric/Behavioral:  The patient is nervous/anxious.    Vital Signs: BP (!) 122/95 (BP Location: Right Arm)    Pulse 87    Temp 98.2 F (36.8 C) (Oral)    Ht 5\' 8"  (1.727 m)    Wt (!) 333 lb (151 kg)    SpO2 100%    BMI 50.63 kg/m   Physical Exam Constitutional:      General: He is not in acute distress.    Appearance: He is obese.  HENT:     Mouth/Throat:     Mouth: Mucous membranes are moist.     Pharynx: Oropharynx is clear.  Cardiovascular:     Rate and Rhythm: Normal rate and regular  rhythm.     Pulses: Normal pulses.     Heart sounds: Normal heart sounds.  Pulmonary:     Effort: Pulmonary effort is normal.     Breath sounds: Normal breath sounds.  Abdominal:     General: Bowel sounds are normal.     Palpations: Abdomen is soft.     Tenderness: There is no abdominal tenderness.     Comments: colostomy  Musculoskeletal:     Right lower leg: No edema.     Left lower leg: No edema.  Skin:    General: Skin is warm and dry.  Neurological:     Mental Status: He is alert and oriented to person, place, and time.     Comments: Peripheral neuropathy    Imaging: No results found.  Labs:  CBC: No results for input(s): WBC, HGB, HCT, PLT in the last 8760 hours.  COAGS: No results for input(s): INR, APTT in the last 8760 hours.  BMP: No results for input(s): NA, K, CL, CO2, GLUCOSE, BUN, CALCIUM, CREATININE, GFRNONAA, GFRAA in the last 8760 hours.  Invalid input(s): CMP  LIVER FUNCTION TESTS: No results for input(s): BILITOT, AST, ALT, ALKPHOS, PROT, ALBUMIN in the last 8760 hours.  TUMOR MARKERS: No results for input(s): AFPTM, CEA, CA199, CHROMGRNA in the last 8760 hours.  Assessment and Plan:  Areflexic bladder: Osualdo P. , 45 year old male, presents today to the [facility] Interventional Radiology department for an image-guided suprapubic catheter placement.   Risks and benefits discussed with the patient including bleeding, infection, damage to adjacent structures, bowel perforation/fistula connection, and sepsis.  All of the patient's questions were answered, patient is agreeable to proceed. Patient has been NPO. Labs are pending.   Consent signed and in chart.  Thank you for this interesting consult.  I greatly enjoyed meeting PILOT PRINDLE and look forward to participating in their care.  A copy of this report was sent to the requesting provider on this date.  Electronically Signed: Holly Bodily, AGACNP-BC [phone] 07/24/2021,  10:38 AM   I spent a total of  30 Minutes   in face to face in clinical consultation, greater than 50% of which was counseling/coordinating care for suprapubic catheter placement.

## 2021-07-24 NOTE — Procedures (Signed)
Interventional Radiology Procedure Note  Procedure: Placement of a 71F suprapubic catheter.   Complications: None  Estimated Blood Loss: None  Recommendations: - Bedrest x 1 hr - DC home - Return to IR in 6 weeks for catheter upsize.    Signed,  Sterling Big, MD

## 2021-08-19 ENCOUNTER — Other Ambulatory Visit (HOSPITAL_COMMUNITY): Payer: Self-pay | Admitting: Interventional Radiology

## 2021-08-19 DIAGNOSIS — N312 Flaccid neuropathic bladder, not elsewhere classified: Secondary | ICD-10-CM

## 2021-08-20 ENCOUNTER — Ambulatory Visit (INDEPENDENT_AMBULATORY_CARE_PROVIDER_SITE_OTHER): Payer: Medicare Other | Admitting: Cardiology

## 2021-08-20 ENCOUNTER — Other Ambulatory Visit: Payer: Self-pay

## 2021-08-20 ENCOUNTER — Ambulatory Visit (INDEPENDENT_AMBULATORY_CARE_PROVIDER_SITE_OTHER): Payer: Self-pay

## 2021-08-20 ENCOUNTER — Encounter: Payer: Self-pay | Admitting: Cardiology

## 2021-08-20 VITALS — BP 110/68 | HR 89 | Ht 68.0 in | Wt 343.0 lb

## 2021-08-20 DIAGNOSIS — E1142 Type 2 diabetes mellitus with diabetic polyneuropathy: Secondary | ICD-10-CM

## 2021-08-20 DIAGNOSIS — I10 Essential (primary) hypertension: Secondary | ICD-10-CM

## 2021-08-20 DIAGNOSIS — R002 Palpitations: Secondary | ICD-10-CM

## 2021-08-20 DIAGNOSIS — Z6841 Body Mass Index (BMI) 40.0 and over, adult: Secondary | ICD-10-CM

## 2021-08-20 DIAGNOSIS — E782 Mixed hyperlipidemia: Secondary | ICD-10-CM | POA: Diagnosis not present

## 2021-08-20 DIAGNOSIS — R011 Cardiac murmur, unspecified: Secondary | ICD-10-CM

## 2021-08-20 NOTE — Patient Instructions (Signed)
Medication Instructions:  Your physician recommends that you continue on your current medications as directed. Please refer to the Current Medication list given to you today.  *If you need a refill on your cardiac medications before your next appointment, please call your pharmacy*   Lab Work: None ordered If you have labs (blood work) drawn today and your tests are completely normal, you will receive your results only by: [place] (if you have MyChart) OR A paper copy in the mail If you have any lab test that is abnormal or we need to change your treatment, we will call you to review the results.   Testing/Procedures: Your physician has requested that you have an echocardiogram. Echocardiography is a painless test that uses sound waves to create images of your heart. It provides your doctor with information about the size and shape of your heart and how well your hearts chambers and valves are working. This procedure takes approximately one hour. There are no restrictions for this procedure.   WHY IS MY DOCTOR PRESCRIBING ZIO? The Zio system is proven and trusted by physicians to detect and diagnose irregular heart rhythms -- and has been prescribed to hundreds of thousands of patients.  The FDA has cleared the Zio system to monitor for many different kinds of irregular heart rhythms. In a study, physicians were able to reach a diagnosis 90% of the time with the Zio system1.  You can wear the Zio monitor -- a small, discreet, comfortable patch -- during your normal day-to-day activity, including while you sleep, shower, and exercise, while it records every single heartbeat for analysis.  1Barrett, P., et al. Comparison of 24 Hour Holter Monitoring Versus 14 Day Novel Adhesive Patch Electrocardiographic Monitoring. [place], 2014.  ZIO VS. HOLTER MONITORING The Zio monitor can be comfortably worn for up to 14 days. Holter monitors can be worn for 24 to 48  hours, limiting the time to record any irregular heart rhythms you may have. Zio is able to capture data for the 51% of patients who have their first symptom-triggered arrhythmia after 48 hours.1  LIVE WITHOUT RESTRICTIONS The Zio ambulatory cardiac monitor is a small, unobtrusive, and water-resistant patch--you might even forget youre wearing it. The Zio monitor records and stores every beat of your heart, whether you're sleeping, working out, or showering. Wear the monitor for 14 days, remove 09/03/21.  Follow-Up: At [facility], you and your health needs are our priority.  As part of our continuing mission to provide you with exceptional heart care, we have created designated Provider Care Teams.  These Care Teams include your primary Cardiologist (physician) and Advanced Practice Providers (APPs -  Physician Assistants and Nurse Practitioners) who all work together to provide you with the care you need, when you need it.  We recommend signing up for the patient portal called "MyChart".  Sign up information is provided on this After Visit Summary.  MyChart is used to connect with patients for Virtual Visits (Telemedicine).  Patients are able to view lab/test results, encounter notes, upcoming appointments, etc.  Non-urgent messages can be sent to your provider as well.   To learn more about what you can do with MyChart, go to [email or web address].    Your next appointment:   6 month(s)  The format for your next appointment:   In Person  Provider:   Jyl Heinz, MD   Other Instructions Echocardiogram An echocardiogram is a test that uses sound waves (ultrasound) to produce images of  the heart. Images from an echocardiogram can provide important information about: Heart size and shape. The size and thickness and movement of your heart's walls. Heart muscle function and strength. Heart valve function or if you have stenosis. Stenosis is when the heart valves are too narrow. If  blood is flowing backward through the heart valves (regurgitation). A tumor or infectious growth around the heart valves. Areas of heart muscle that are not working well because of poor blood flow or injury from a heart attack. Aneurysm detection. An aneurysm is a weak or damaged part of an artery wall. The wall bulges out from the normal force of blood pumping through the body. Tell a health care provider about: Any allergies you have. All medicines you are taking, including vitamins, herbs, eye drops, creams, and over-the-counter medicines. Any blood disorders you have. Any surgeries you have had. Any medical conditions you have. Whether you are pregnant or may be pregnant. What are the risks? Generally, this is a safe test. However, problems may occur, including an allergic reaction to dye (contrast) that may be used during the test. What happens before the test? No specific preparation is needed. You may eat and drink normally. What happens during the test? You will take off your clothes from the waist up and put on a hospital gown. Electrodes or electrocardiogram (ECG)patches may be placed on your chest. The electrodes or patches are then connected to a device that monitors your heart rate and rhythm. You will lie down on a table for an ultrasound exam. A gel will be applied to your chest to help sound waves pass through your skin. A handheld device, called a transducer, will be pressed against your chest and moved over your heart. The transducer produces sound waves that travel to your heart and bounce back (or "echo" back) to the transducer. These sound waves will be captured in real-time and changed into images of your heart that can be viewed on a video monitor. The images will be recorded on a computer and reviewed by your health care provider. You may be asked to change positions or hold your breath for a short time. This makes it easier to get different views or better views of your  heart. In some cases, you may receive contrast through an IV in one of your veins. This can improve the quality of the pictures from your heart. The procedure may vary among health care providers and hospitals.   What can I expect after the test? You may return to your normal, everyday life, including diet, activities, and medicines, unless your health care provider tells you not to do that. Follow these instructions at home: It is up to you to get the results of your test. Ask your health care provider, or the department that is doing the test, when your results will be ready. Keep all follow-up visits. This is important. Summary An echocardiogram is a test that uses sound waves (ultrasound) to produce images of the heart. Images from an echocardiogram can provide important information about the size and shape of your heart, heart muscle function, heart valve function, and other possible heart problems. You do not need to do anything to prepare before this test. You may eat and drink normally. After the echocardiogram is completed, you may return to your normal, everyday life, unless your health care provider tells you not to do that. This information is not intended to replace advice given to you by your health care provider. Make  sure you discuss any questions you have with your health care provider. Document Revised: 03/04/2020 Document Reviewed: 03/04/2020 Elsevier Patient Education  2021 [employer].

## 2021-08-20 NOTE — Progress Notes (Signed)
Cardiology Office Note:    Date:  08/20/2021   ID:  Gavin Smith, DOB 12-17-1975, MRN [id number]  PCP:  Wilmer Floor., MD  Cardiologist:  Garwin Brothers, MD   Referring MD: Wilmer Floor., MD    ASSESSMENT:    1. HTN (hypertension), benign   2. Mixed hyperlipidemia   3. Type 2 diabetes, controlled, with peripheral neuropathy (HCC)   4. Palpitations   5. Murmur, cardiac   6. Cardiac murmur   7. Morbid obesity with BMI of 45.0-49.9, adult (HCC)    PLAN:    In order of problems listed above:  Primary prevention stressed with patient.  Importance of compliance with diet medication stressed and vocalized understanding. Essential hypertension: Blood pressure stable and diet was emphasized. Mixed dyslipidemia diabetes mellitus and morbid obesity: I discussed this with the patient in extensive length.  Diet and lifestyle modification urged and he promises to do better.  This is followed by primary care. Cardiac murmur: Echocardiogram will be done to assess murmur heard on auscultation. Palpitations: This appears to be not very significant based on history however to get an evaluation I will do a 2-week monitor.  They occur frequently and we should be able to diagnose any significant issues on the monitor.   Patient will be seen in follow-up appointment in 6 months or earlier if the patient has any concerns    Medication Adjustments/Labs and Tests Ordered: Current medicines are reviewed at length with the patient today.  Concerns regarding medicines are outlined above.  Orders Placed This Encounter  Procedures   LONG TERM MONITOR (3-14 DAYS)   EKG 12-Lead   ECHOCARDIOGRAM COMPLETE   No orders of the defined types were placed in this encounter.    History of Present Illness:    Gavin Smith is a 46 y.o. adult who is being seen today for the evaluation of palpitations at the request of Wilmer Floor., MD. patient is a pleasant 46 year old male.  He has  past medical history of essential hypertension and dyslipidemia and morbid obesity.  He mentions to me that he occasionally experiences palpitations about once or twice every 2 weeks.  No chest pain orthopnea or PND.  He tells me that this does not occur on exertion.  This denies any history of cardiac.  He tells me that he walks outside significantly without any symptoms.  He has history of diabetes mellitus.  At the time of my evaluation, the patient is alert awake oriented and in no distress.  Patient also gives history of colostomy and suprapubic cath.  Past Medical History:  Diagnosis Date   Allergic rhinitis    Asperger's syndrome 06/26/2019   Asthma exacerbation 07/22/2016   Carpal tunnel syndrome, bilateral 06/27/2020   Colostomy in place [facility])    Diabetic gastropathy (HCC)    Diabetic neuropathy, type II diabetes mellitus (HCC)    Diabetic peripheral neuropathy (HCC) 07/15/2015   Encounter for postoperative care 03/12/2021   Essential hypertension, benign    Extrinsic asthma, unspecified    Family history of breast cancer gene mutation in first degree relative 05/29/2019   Food intolerance    Gender dysphoria 12/19/2018   Formatting of this note might be different from the original. Trans Male Assessment and Plan  Pronoun Use: he/him Preferred Name: JP Preferred Anatomical Terminology: scientific Legal Documentation Corrected: None  Hormone Start Date: Dec 13, 2018 Current Testosterone Dose: 50 mg cypionate Mode of Delivery (SQ vs IM): IM Injection Schedule (  Weekly?  Twice a week?): weekly  Recommended Monitoring S   Generalized osteoarthrosis, involving multiple sites    History of colostomy    Hormone replacement therapy    HTN (hypertension), benign    Hyperlipidemia    IDDM (insulin dependent diabetes mellitus) 06/16/2018   Inappropriate sinus tachycardia    Incisional hernia with obstruction but no gangrene 07/22/2017   Insomnia, unspecified    Lumbar spinal stenosis 07/15/2015    Morbid obesity with BMI of 45.0-49.9, adult (HCC)    Muscular dystrophy (HCC) 07/09/2021   Formatting of this note might be different from the original. diagnosed age 50 yrs   Neuropathy 02/27/2015   Other hyperlipidemia    Pityriasis rosea    Pure hypercholesterolemia    Pure hypercholesterolemia    Sleep apnea    Type 2 diabetes, controlled, with peripheral neuropathy (HCC)    Ulnar neuritis, left    Urinary retention with incomplete bladder emptying    Vitamin D deficiency     Past Surgical History:  Procedure Laterality Date   BREAST REDUCTION SURGERY  2005   CHOLECYSTECTOMY  2000   RHINOPLASTY  2005   SPINAL FUSION  2006   L5-S1 spinal fusion    Current Medications: Current Meds  Medication Sig   Accu-Chek FastClix Lancets MISC USE TO CHECK BLOOD SUGAR 4 TIMES A DAY   albuterol (PROVENTIL) (2.5 MG/3ML) 0.083% nebulizer solution Take 2.5 mg by nebulization every 6 (six) hours as needed for wheezing.    albuterol (VENTOLIN HFA) 108 (90 Base) MCG/ACT inhaler Inhale 1 puff into the lungs every 4 (four) hours as needed for wheezing.    atorvastatin (LIPITOR) 20 MG tablet Take 20 mg by mouth daily.   cetirizine (ZYRTEC) 10 MG tablet Take 10 mg by mouth daily.   Continuous Blood Gluc Sensor (FREESTYLE LIBRE 2 SENSOR) MISC 1 each by Does not apply route every 14 (fourteen) days.   fluticasone (FLONASE) 50 MCG/ACT nasal spray Place 2 sprays into both nostrils 2 (two) times daily as needed for allergies.   glucose blood (ACCU-CHEK GUIDE) test strip Use as instructed 4x a day   insulin glargine (LANTUS SOLOSTAR) 100 UNIT/ML Solostar Pen ADMINISTER 24 TO 26 UNITS UNDER THE SKIN TWICE DAILY   LINZESS 145 MCG CAPS capsule Take 145 mcg by mouth daily.   lisinopril (PRINIVIL,ZESTRIL) 10 MG tablet Take 10 mg by mouth daily.     NOVOLOG FLEXPEN 100 UNIT/ML FlexPen INJECT 0 TO 50 UNITS UNDER THE SKIN PER SLIDING SCALE FOUR TIMES DAILY WITH MAX DAILY DOSE OF 200 UNITS   omega-3 acid ethyl  esters (LOVAZA) 1 g capsule Take 2 capsules by mouth 2 (two) times daily.   oxybutynin (DITROPAN) 5 MG tablet Take 5 mg by mouth 3 (three) times daily as needed for other. Overactive bladder   PULMICORT 0.5 MG/2ML nebulizer solution Take 2 mLs by nebulization daily as needed for wheezing.   rizatriptan (MAXALT-MLT) 10 MG disintegrating tablet Take 10 mg by mouth as needed for headache.   sulfamethoxazole-trimethoprim (BACTRIM DS) 800-160 MG tablet Take 1 tablet by mouth daily.   testosterone cypionate (DEPOTESTOSTERONE CYPIONATE) 200 MG/ML injection Inject 50 mg into the muscle once a week. 0.25 mL   tiZANidine (ZANAFLEX) 2 MG tablet Take 2 mg by mouth every 8 (eight) hours as needed for muscle spasms.    traMADol (ULTRAM) 50 MG tablet Take 50-100 mg by mouth every 8 (eight) hours as needed for severe pain. 100 mg every night at bedtime  Vitamin D, Ergocalciferol, (DRISDOL) 1.25 MG (50000 UNIT) CAPS capsule TAKE 1 CAPSULE BY MOUTH DAILY   zolpidem (AMBIEN) 10 MG tablet Take 10 mg by mouth at bedtime as needed for sleep.     Allergies:   Fenofibrate, Humalog [insulin lispro], Penicillins, and Morphine and related   Social History   Socioeconomic History   Marital status: Single    Spouse name: Not on file   Number of children: 0   Years of education: 14   Highest education level: Not on file  Occupational History   Not on file  Tobacco Use   Smoking status: Never   Smokeless tobacco: Never  Substance and Sexual Activity   Alcohol use: No   Drug use: No   Sexual activity: Not on file  Other Topics Concern   Not on file  Social History Narrative   Not on file   Social Determinants of Health   Financial Resource Strain: Not on file  Food Insecurity: Not on file  Transportation Needs: Not on file  Physical Activity: Not on file  Stress: Not on file  Social Connections: Not on file     Family History: The patient's family history includes Cancer in his maternal grandmother  and another family member; Diabetes in his maternal grandmother and mother; Heart disease in his maternal grandfather and paternal grandfather; Hyperlipidemia in his father and mother; Hypertension in his father, maternal grandfather, and mother.  ROS:   Please see the history of present illness.    All other systems reviewed and are negative.  EKGs/Labs/Other Studies Reviewed:    The following studies were reviewed today: EKG reveals sinus rhythm and nonspecific ST-T changes   Recent Labs: 07/24/2021: Hemoglobin 17.2; Platelets 223  Recent Lipid Panel    Component Value Date/Time   CHOL 180 03/20/2019 0000   TRIG 173 (A) 03/20/2019 0000   HDL 48 03/20/2019 0000   CHOLHDL 5 07/15/2017 1126   VLDL 74.4 (H) 07/15/2017 1126   LDLCALC 97 03/20/2019 0000   LDLDIRECT 119.0 07/15/2017 1126    Physical Exam:    VS:  BP 110/68    Pulse 89    Ht 5\' 8"  (1.727 m)    Wt (!) 343 lb (155.6 kg)    SpO2 95%    BMI 52.15 kg/m     Wt Readings from Last 3 Encounters:  08/20/21 (!) 343 lb (155.6 kg)  07/24/21 (!) 333 lb (151 kg)  05/18/21 (!) 334 lb 12.8 oz (151.9 kg)     GEN: Patient is in no acute distress HEENT: Normal NECK: No JVD; No carotid bruits LYMPHATICS: No lymphadenopathy CARDIAC: S1 S2 regular, 2/6 systolic murmur at the apex. RESPIRATORY:  Clear to auscultation without rales, wheezing or rhonchi  ABDOMEN: Soft, non-tender, non-distended MUSCULOSKELETAL:  No edema; No deformity  SKIN: Warm and dry NEUROLOGIC:  Alert and oriented x 3 PSYCHIATRIC:  Normal affect    Signed, Garwin Brothersajan R Shareena Nusz, MD  08/20/2021 3:21 PM    [facility]

## 2021-08-25 ENCOUNTER — Ambulatory Visit (INDEPENDENT_AMBULATORY_CARE_PROVIDER_SITE_OTHER): Payer: Medicare Other

## 2021-08-25 ENCOUNTER — Other Ambulatory Visit: Payer: Self-pay

## 2021-08-25 DIAGNOSIS — R011 Cardiac murmur, unspecified: Secondary | ICD-10-CM | POA: Diagnosis not present

## 2021-08-25 LAB — ECHOCARDIOGRAM COMPLETE
Area-P 1/2: 5.13 cm2
S' Lateral: 3.1 cm

## 2021-08-27 ENCOUNTER — Other Ambulatory Visit: Payer: Self-pay | Admitting: Radiology

## 2021-08-31 ENCOUNTER — Encounter (HOSPITAL_COMMUNITY): Payer: Self-pay

## 2021-08-31 ENCOUNTER — Other Ambulatory Visit: Payer: Self-pay

## 2021-08-31 ENCOUNTER — Ambulatory Visit (HOSPITAL_COMMUNITY)
Admission: RE | Admit: 2021-08-31 | Discharge: 2021-08-31 | Disposition: A | Discharge status: Home / Self Care | Payer: Medicare Other | Source: Ambulatory Visit | Attending: Interventional Radiology | Admitting: Interventional Radiology

## 2021-08-31 DIAGNOSIS — N312 Flaccid neuropathic bladder, not elsewhere classified: Secondary | ICD-10-CM | POA: Diagnosis not present

## 2021-08-31 DIAGNOSIS — F845 Asperger's syndrome: Secondary | ICD-10-CM | POA: Diagnosis not present

## 2021-08-31 DIAGNOSIS — Z435 Encounter for attention to cystostomy: Secondary | ICD-10-CM | POA: Insufficient documentation

## 2021-08-31 HISTORY — PX: IR CATHETER TUBE CHANGE: IMG717

## 2021-08-31 LAB — GLUCOSE, CAPILLARY: Glucose-Capillary: 164 mg/dL — ABNORMAL HIGH (ref 70–99)

## 2021-08-31 MED ORDER — FENTANYL CITRATE (PF) 100 MCG/2ML IJ SOLN
INTRAMUSCULAR | Status: AC
  Filled 2021-08-31: qty 4

## 2021-08-31 MED ORDER — MIDAZOLAM HCL 2 MG/2ML IJ SOLN
INTRAMUSCULAR | Status: AC | PRN
  Administered 2021-08-31 (×2): 1 mg via INTRAVENOUS
  Administered 2021-08-31: 2 mg via INTRAVENOUS
  Administered 2021-08-31 (×2): 1 mg via INTRAVENOUS

## 2021-08-31 MED ORDER — FENTANYL CITRATE (PF) 100 MCG/2ML IJ SOLN
INTRAMUSCULAR | Status: AC | PRN
  Administered 2021-08-31 (×3): 50 ug via INTRAVENOUS
  Administered 2021-08-31: 100 ug via INTRAVENOUS
  Administered 2021-08-31: 50 ug via INTRAVENOUS

## 2021-08-31 MED ORDER — MIDAZOLAM HCL 2 MG/2ML IJ SOLN
INTRAMUSCULAR | Status: AC
  Filled 2021-08-31: qty 2

## 2021-08-31 MED ORDER — FENTANYL CITRATE (PF) 100 MCG/2ML IJ SOLN
INTRAMUSCULAR | Status: AC
  Filled 2021-08-31: qty 2

## 2021-08-31 MED ORDER — SODIUM CHLORIDE 0.9 % IV SOLN: INTRAVENOUS | Status: DC

## 2021-08-31 MED ORDER — IOHEXOL 300 MG/ML  SOLN
100.0000 mL | Freq: Once | INTRAMUSCULAR | Status: AC | PRN
  Administered 2021-08-31: 10 mL

## 2021-08-31 MED ORDER — MIDAZOLAM HCL 2 MG/2ML IJ SOLN
INTRAMUSCULAR | Status: AC
  Filled 2021-08-31: qty 4

## 2021-08-31 MED ORDER — LIDOCAINE HCL 1 % IJ SOLN
INTRAMUSCULAR | Status: AC
  Administered 2021-08-31: 5 mL via SUBCUTANEOUS
  Filled 2021-08-31: qty 20

## 2021-08-31 NOTE — H&P (Signed)
Chief Complaint: Patient was seen in consultation today for suprapubic catheter exchange/upsize at the request of Dr Phillis Knack   Supervising Physician: Jacqulynn Cadet  Patient Status: [facility] - Out-pt  History of Present Illness: Gavin Smith is a 46 y.o. adult   Known to IR Suprapubic catheter placed 07/24/21--14 Fr Hx Aspergers syndrome-- Areflexic bladder  Scheduled today for exchange and upsize Pt denies any issues with catheter Denies fever/N/V    Past Medical History:  Diagnosis Date   Allergic rhinitis    Asperger's syndrome 06/26/2019   Asthma exacerbation 07/22/2016   Carpal tunnel syndrome, bilateral 06/27/2020   Colostomy in place [facility])    Diabetic gastropathy ([place])    Diabetic neuropathy, type II diabetes mellitus ([place])    Diabetic peripheral neuropathy ([place]) 07/15/2015   Encounter for postoperative care 03/12/2021   Essential hypertension, benign    Extrinsic asthma, unspecified    Family history of breast cancer gene mutation in first degree relative 05/29/2019   Food intolerance    Gender dysphoria 12/19/2018   Formatting of this note might be different from the original. Trans Male Assessment and Plan  Pronoun Use: he/him Preferred Name: Gavin Smith Preferred Anatomical Terminology: scientific Legal Documentation Corrected: None  Hormone Start Date: Dec 13, 2018 Current Testosterone Dose: 50 mg cypionate Mode of Delivery (SQ vs IM): IM Injection Schedule (Weekly?  Twice a week?): weekly  Recommended Monitoring S   Generalized osteoarthrosis, involving multiple sites    History of colostomy    Hormone replacement therapy    HTN (hypertension), benign    Hyperlipidemia    IDDM (insulin dependent diabetes mellitus) 06/16/2018   Inappropriate sinus tachycardia    Incisional hernia with obstruction but no gangrene 07/22/2017   Insomnia, unspecified    Lumbar spinal stenosis 07/15/2015   Morbid obesity with BMI of 45.0-49.9, adult ([place])    Muscular dystrophy ([place])  07/09/2021   Formatting of this note might be different from the original. diagnosed age 51 yrs   Neuropathy 02/27/2015   Other hyperlipidemia    Pityriasis rosea    Pure hypercholesterolemia    Pure hypercholesterolemia    Sleep apnea    Type 2 diabetes, controlled, with peripheral neuropathy ([place])    Ulnar neuritis, left    Urinary retention with incomplete bladder emptying    Vitamin D deficiency     Past Surgical History:  Procedure Laterality Date   BREAST REDUCTION SURGERY  2005   CHOLECYSTECTOMY  2000   RHINOPLASTY  2005   SPINAL FUSION  2006   L5-S1 spinal fusion    Allergies: Fenofibrate, Humalog [insulin lispro], Penicillins, and Morphine and related  Medications: Prior to Admission medications   Medication Sig Start Date End Date Taking? Authorizing Provider  Accu-Chek FastClix Lancets MISC USE TO CHECK BLOOD SUGAR 4 TIMES A DAY 01/30/21  Yes Philemon Kingdom, MD  atorvastatin (LIPITOR) 20 MG tablet Take 20 mg by mouth daily. 03/25/21  Yes [provider]  cetirizine (ZYRTEC) 10 MG tablet Take 10 mg by mouth daily.   Yes [provider]  Continuous Blood Gluc Sensor (FREESTYLE LIBRE 2 SENSOR) MISC 1 each by Does not apply route every 14 (fourteen) days. 02/12/21  Yes Philemon Kingdom, MD  glucose blood (ACCU-CHEK GUIDE) test strip Use as instructed 4x a day 01/30/21  Yes Philemon Kingdom, MD  insulin glargine (LANTUS SOLOSTAR) 100 UNIT/ML Solostar Pen ADMINISTER 24 TO 26 UNITS UNDER THE SKIN TWICE DAILY 04/15/21  Yes Philemon Kingdom, MD  Rolan Lipa  145 MCG CAPS capsule Take 145 mcg by mouth daily as needed (constipation). 05/19/21  Yes [provider]  lisinopril (PRINIVIL,ZESTRIL) 10 MG tablet Take 10 mg by mouth daily.     Yes [provider]  NOVOLOG FLEXPEN 100 UNIT/ML FlexPen INJECT 0 TO 50 UNITS UNDER THE SKIN PER SLIDING SCALE FOUR TIMES DAILY WITH MAX DAILY DOSE OF 200 UNITS 04/29/21  Yes Philemon Kingdom, MD  omega-3 acid ethyl  esters (LOVAZA) 1 g capsule Take 2 capsules by mouth 2 (two) times daily. 03/18/21  Yes [provider]  oxybutynin (DITROPAN) 5 MG tablet Take 5 mg by mouth 3 (three) times daily as needed for bladder spasms. Overactive bladder 08/04/21  Yes [provider]  sulfamethoxazole-trimethoprim (BACTRIM DS) 800-160 MG tablet Take 1 tablet by mouth daily. 07/22/21  Yes [provider]  testosterone cypionate (DEPOTESTOSTERONE CYPIONATE) 200 MG/ML injection Inject 50 mg into the muscle once a week. 0.25 mL   Yes [provider]  tiZANidine (ZANAFLEX) 2 MG tablet Take 2 mg by mouth every 8 (eight) hours as needed for muscle spasms.    Yes [provider]  traMADol (ULTRAM) 50 MG tablet Take 50-100 mg by mouth every 8 (eight) hours as needed for severe pain. 100 mg every night at bedtime   Yes [provider]  Vitamin D, Ergocalciferol, (DRISDOL) 1.25 MG (50000 UNIT) CAPS capsule TAKE 1 CAPSULE BY MOUTH DAILY Patient taking differently: Take 50,000 Units by mouth daily. 02/18/20  Yes Philemon Kingdom, MD  zolpidem (AMBIEN) 10 MG tablet Take 10 mg by mouth at bedtime as needed for sleep. 07/30/21  Yes [provider]  albuterol (PROVENTIL) (2.5 MG/3ML) 0.083% nebulizer solution Take 2.5 mg by nebulization every 6 (six) hours as needed for wheezing.     [provider]  albuterol (VENTOLIN HFA) 108 (90 Base) MCG/ACT inhaler Inhale 1 puff into the lungs every 4 (four) hours as needed for wheezing.     [provider]  fluticasone (FLONASE) 50 MCG/ACT nasal spray Place 2 sprays into both nostrils 2 (two) times daily as needed for allergies. 03/05/21   [provider]  PULMICORT 0.5 MG/2ML nebulizer solution Take 2 mLs by nebulization daily as needed for wheezing. 04/26/16   [provider]  rizatriptan (MAXALT-MLT) 10 MG disintegrating tablet Take 10 mg by mouth as needed for headache. 10/31/19   [provider]      Family History  Problem Relation Age of Onset   Diabetes Mother    Hypertension Mother    Hyperlipidemia Mother    Hypertension Father    Hyperlipidemia Father    Diabetes Maternal Grandmother    Cancer Maternal Grandmother        Breast Cancer   Hypertension Maternal Grandfather    Heart disease Maternal Grandfather    Heart disease Paternal Grandfather    Cancer Other        Ovarian Cancer-Grandparent    Social History   Socioeconomic History   Marital status: Single    Spouse name: Not on file   Number of children: 0   Years of education: 14   Highest education level: Not on file  Occupational History   Not on file  Tobacco Use   Smoking status: Never   Smokeless tobacco: Never  Substance and Sexual Activity   Alcohol use: No   Drug use: No   Sexual activity: Not on file  Other Topics Concern   Not on file  Social History  Narrative   Not on file   Social Determinants of Health   Financial Resource Strain: Not on file  Food Insecurity: Not on file  Transportation Needs: Not on file  Physical Activity: Not on file  Stress: Not on file  Social Connections: Not on file    Review of Systems: A 12 point ROS discussed and pertinent positives are indicated in the HPI above.  All other systems are negative.  Review of Systems  Constitutional:  Negative for activity change, appetite change, fatigue and fever.  Respiratory:  Negative for cough and shortness of breath.   Cardiovascular:  Negative for chest pain.  Gastrointestinal:  Negative for abdominal pain.  Psychiatric/Behavioral:  Negative for behavioral problems and confusion.    Vital Signs: BP 135/88    Pulse 86    Temp 98.1 F (36.7 C) (Oral)    Resp 18    Ht 5\' 8"  (1.727 m)    Wt (!) 332 lb (150.6 kg)    SpO2 99%    BMI 50.48 kg/m   Physical Exam Vitals reviewed.  HENT:     Mouth/Throat:     Mouth: Mucous membranes are moist.  Cardiovascular:     Rate and Rhythm: Normal rate and regular  rhythm.     Heart sounds: Normal heart sounds.  Pulmonary:     Effort: Pulmonary effort is normal.     Breath sounds: Normal breath sounds.  Abdominal:     Palpations: Abdomen is soft.  Musculoskeletal:        General: Normal range of motion.  Skin:    General: Skin is warm.  Neurological:     Mental Status: He is alert and oriented to person, place, and time.  Psychiatric:        Behavior: Behavior normal.    Imaging: ECHOCARDIOGRAM COMPLETE  Result Date: 08/25/2021    ECHOCARDIOGRAM REPORT   Patient Name:   Gavin Smith Date of Exam: 08/25/2021 Medical Rec #:  [id number]      Height:       68.0 in Accession #:    [id number]     Weight:       343.0 lb Date of Birth:  September 22, 1975      BSA:          2.570 m Patient Age:    43 years       BP:           110/68 mmHg Patient Gender: M              HR:           90 bpm. Exam Location:  [place] Procedure: 2D Echo, Cardiac Doppler, Color Doppler and Strain Analysis Indications:    Murmur, cardiac [R01.1 (ICD-10-CM)]  History:        Patient has no prior history of Echocardiogram examinations.                 Signs/Symptoms:Morbid obesity; Risk Factors:Hypertension and                 Diabetes.  Sonographer:    Luane School RDCS Referring Phys: Waverly Ferrari [facility]  Sonographer Comments: Suboptimal subcostal window. IMPRESSIONS  1. Left ventricular ejection fraction, by estimation, is 60 to 65%. The left ventricle has normal function. The left ventricle has no regional wall motion abnormalities. There is mild left ventricular hypertrophy. Left ventricular diastolic parameters are consistent with Grade I diastolic dysfunction (impaired relaxation).  2. Right ventricular systolic  function is normal. The right ventricular size is normal. Tricuspid regurgitation signal is inadequate for assessing PA pressure.  3. The mitral valve is normal in structure. No evidence of mitral valve regurgitation. No evidence of mitral stenosis.  4. The aortic valve is  tricuspid. Aortic valve regurgitation is not visualized. No aortic stenosis is present.  5. Aortic DTA is NWV. There is mild dilatation of the ascending aorta, measuring 37 mm.  6. The inferior vena cava is normal in size with greater than 50% respiratory variability, suggesting right atrial pressure of 3 mmHg. FINDINGS  Left Ventricle: Left ventricular ejection fraction, by estimation, is 60 to 65%. The left ventricle has normal function. The left ventricle has no regional wall motion abnormalities. The left ventricular internal cavity size was normal in size. There is  mild left ventricular hypertrophy. Left ventricular diastolic parameters are consistent with Grade I diastolic dysfunction (impaired relaxation). Normal left ventricular filling pressure. Right Ventricle: The right ventricular size is normal. No increase in right ventricular wall thickness. Right ventricular systolic function is normal. Tricuspid regurgitation signal is inadequate for assessing PA pressure. The tricuspid regurgitant velocity is 2.29 m/s, and with an assumed right atrial pressure of 3 mmHg, the estimated right ventricular systolic pressure is 0000000 mmHg. Left Atrium: Left atrial size was normal in size. Right Atrium: Right atrial size was normal in size. Pericardium: There is no evidence of pericardial effusion. Mitral Valve: The mitral valve is normal in structure. No evidence of mitral valve regurgitation. No evidence of mitral valve stenosis. Tricuspid Valve: The tricuspid valve is normal in structure. Tricuspid valve regurgitation is trivial. No evidence of tricuspid stenosis. Aortic Valve: The aortic valve is tricuspid. Aortic valve regurgitation is not visualized. No aortic stenosis is present. Pulmonic Valve: The pulmonic valve was normal in structure. Pulmonic valve regurgitation is not visualized. No evidence of pulmonic stenosis. Aorta: The aortic root, ascending aorta and aortic arch are all structurally normal, with no  evidence of dilitation or obstruction and DTA is NWV. There is mild dilatation of the ascending aorta, measuring 37 mm. Venous: A normal flow pattern is recorded from the right upper pulmonary vein. The inferior vena cava is normal in size with greater than 50% respiratory variability, suggesting right atrial pressure of 3 mmHg. IAS/Shunts: No atrial level shunt detected by color flow Doppler.  LEFT VENTRICLE PLAX 2D LVIDd:         4.60 cm   Diastology LVIDs:         3.10 cm   LV e' medial:    8.05 cm/s LV PW:         1.30 cm   LV E/e' medial:  12.3 LV IVS:        1.30 cm   LV e' lateral:   9.03 cm/s LVOT diam:     1.90 cm   LV E/e' lateral: 11.0 LV SV:         61 LV SV Index:   24 LVOT Area:     2.84 cm  RIGHT VENTRICLE             IVC RV S prime:     11.60 cm/s  IVC diam: 1.80 cm TAPSE (M-mode): 2.5 cm LEFT ATRIUM             Index        RIGHT ATRIUM           Index LA diam:        4.00 cm 1.56  cm/m   RA Area:     14.00 cm LA Vol (A2C):   45.1 ml 17.55 ml/m  RA Volume:   32.90 ml  12.80 ml/m LA Vol (A4C):   36.9 ml 14.36 ml/m LA Biplane Vol: 42.4 ml 16.50 ml/m  AORTIC VALVE LVOT Vmax:   101.00 cm/s LVOT Vmean:  68.900 cm/s LVOT VTI:    0.214 m  AORTA Ao Root diam: 3.60 cm Ao Asc diam:  3.70 cm Ao Desc diam: 2.50 cm MITRAL VALVE                TRICUSPID VALVE MV Area (PHT): 5.13 cm     TR Peak grad:   21.0 mmHg MV Decel Time: 148 msec     TR Vmax:        229.00 cm/s MV E velocity: 99.20 cm/s MV A velocity: 112.00 cm/s  SHUNTS MV E/A ratio:  0.89         Systemic VTI:  0.21 m                             Systemic Diam: 1.90 cm Shirlee More MD Electronically signed by Shirlee More MD Signature Date/Time: 08/25/2021/4:47:34 PM    Final     Labs:  CBC: Recent Labs    07/24/21 1058  WBC 11.2*  HGB 17.2*  HCT 50.1  PLT 223    COAGS: Recent Labs    07/24/21 1058  INR 0.9    BMP: No results for input(s): NA, K, CL, CO2, GLUCOSE, BUN, CALCIUM, CREATININE, GFRNONAA, GFRAA in the last 8760  hours.  Invalid input(s): CMP  LIVER FUNCTION TESTS: No results for input(s): BILITOT, AST, ALT, ALKPHOS, PROT, ALBUMIN in the last 8760 hours.  TUMOR MARKERS: No results for input(s): AFPTM, CEA, CA199, CHROMGRNA in the last 8760 hours.  Assessment and Plan:  Asperger's syndrome-Areflexic bladder 14 fr suprapubic catheter placed 07/24/21 in IR Todays visit for follow up evaluation and exchange/upsize Pt is aware of procedure benefits and risks including but not limited to: Infection; bleeding; damage to surrounding structures Agreeable to proceed Consent signed and in IR  Thank you for this interesting consult.  I greatly enjoyed meeting KELIAN ADRIANCE and look forward to participating in their care.  A copy of this report was sent to the requesting provider on this date.  Electronically Signed: Lavonia Drafts, PA-C 08/31/2021, 10:29 AM   I spent a total of    25 Minutes in face to face in clinical consultation, greater than 50% of which was counseling/coordinating care for suprapubic catheter exchange/uspize

## 2021-08-31 NOTE — Procedures (Signed)
Interventional Radiology Procedure Note  Procedure: Successful exchange and upsize of 40F suprapubic catheter to an 12F foley catheter  Complications: None  Estimated Blood Loss: None  Recommendations: - DC home  Signed,  Sterling Big, MD

## 2021-09-01 ENCOUNTER — Telehealth: Payer: Self-pay | Admitting: *Deleted

## 2021-09-01 NOTE — Telephone Encounter (Signed)
Spoke with patient concerning zio heart monitor he had worn. Pt sent it back after less than 3 days of wear, he stated that it just fell off. Pt called [employer] and they told him to just mail it back and they would not bill his insurance company. When asked if he would wear another one he stated the monitor had given him a rash. Will show the results to Dr. Tomie China and see if he can use it.

## 2021-11-07 ENCOUNTER — Emergency Department (HOSPITAL_COMMUNITY)
Admission: EM | Admit: 2021-11-07 | Discharge: 2021-11-07 | Disposition: A | Discharge status: Home / Self Care | Payer: Medicare Other | Attending: Emergency Medicine | Admitting: Emergency Medicine

## 2021-11-07 ENCOUNTER — Other Ambulatory Visit: Payer: Self-pay

## 2021-11-07 ENCOUNTER — Encounter (HOSPITAL_COMMUNITY): Payer: Self-pay | Admitting: Emergency Medicine

## 2021-11-07 ENCOUNTER — Emergency Department (HOSPITAL_COMMUNITY): Payer: Medicare Other

## 2021-11-07 DIAGNOSIS — W01198A Fall on same level from slipping, tripping and stumbling with subsequent striking against other object, initial encounter: Secondary | ICD-10-CM | POA: Insufficient documentation

## 2021-11-07 DIAGNOSIS — R531 Weakness: Secondary | ICD-10-CM | POA: Insufficient documentation

## 2021-11-07 DIAGNOSIS — M545 Low back pain, unspecified: Secondary | ICD-10-CM | POA: Diagnosis present

## 2021-11-07 DIAGNOSIS — E119 Type 2 diabetes mellitus without complications: Secondary | ICD-10-CM | POA: Insufficient documentation

## 2021-11-07 DIAGNOSIS — Y92009 Unspecified place in unspecified non-institutional (private) residence as the place of occurrence of the external cause: Secondary | ICD-10-CM | POA: Insufficient documentation

## 2021-11-07 DIAGNOSIS — Z79899 Other long term (current) drug therapy: Secondary | ICD-10-CM | POA: Diagnosis not present

## 2021-11-07 DIAGNOSIS — I10 Essential (primary) hypertension: Secondary | ICD-10-CM | POA: Insufficient documentation

## 2021-11-07 DIAGNOSIS — Z794 Long term (current) use of insulin: Secondary | ICD-10-CM | POA: Diagnosis not present

## 2021-11-07 MED ORDER — CYCLOBENZAPRINE HCL 10 MG PO TABS: 10.0000 mg | ORAL_TABLET | Freq: Two times a day (BID) | ORAL | 0 refills | Status: DC | PRN

## 2021-11-07 MED ORDER — OXYCODONE-ACETAMINOPHEN 5-325 MG PO TABS
1.0000 | ORAL_TABLET | Freq: Three times a day (TID) | ORAL | 0 refills | Status: DC | PRN
Start: 2021-11-07 — End: 2022-11-30

## 2021-11-07 NOTE — ED Provider Notes (Signed)
?[facility] ?Provider Note ? ? ?CSN: [id number] ?Arrival date & time: 11/07/21  1804 ? ?  ? ?History ? ?Chief Complaint  ?Patient presents with  ? Back Pain  ? Fall  ? ? ?G[facility] is a 46 y.o. adult. ? ?HPI ?Patient is a 46 year old adult who presents to the emergency department due to low back pain.  Patient has a complex medical history including but not limited to muscular dystrophy, obesity, diabetes mellitus, hyperlipidemia, hypertension, severe lumbar spinal stenosis status post lumbar fusion that occurred about 17 years ago in [place].  Patient states that he tripped and fell behind his couch landing on his stomach.  He was able to get himself off the ground and ambulate and about 10 minutes later noticed a mild pain in the low back.  He states that over the past 2 days it has been waxing and waning but after waking up this morning it was much more severe.  Pain does not radiate down his legs but he does note that he has chronic neuropathy so it is difficult to tell.  Appears to have baseline weakness in the bilateral legs, left greater than right.  He states that since his pain this morning he has had ambulate with arm crutches which he does not typically do.  Patient has a colostomy and suprapubic catheter in place.  Patient states that he has been taking tramadol as well as tizanidine with minimal relief.  Denies any fevers, chills, unintentional weight loss, IVDA. ?  ? ?Home Medications ?Prior to Admission medications   ?Medication Sig Start Date End Date Taking? Authorizing Provider  ?cyclobenzaprine (FLEXERIL) 10 MG tablet Take 1 tablet (10 mg total) by mouth 2 (two) times daily as needed for muscle spasms. 11/07/21  Yes Placido Sou, PA-C  ?oxyCODONE-acetaminophen (PERCOCET/ROXICET) 5-325 MG tablet Take 1 tablet by mouth every 8 (eight) hours as needed for severe pain. 11/07/21  Yes Placido Sou, PA-C  ?Accu-Chek FastClix Lancets MISC USE TO CHECK  BLOOD SUGAR 4 TIMES A DAY 01/30/21   Carlus Pavlov, MD  ?albuterol (PROVENTIL) (2.5 MG/3ML) 0.083% nebulizer solution Take 2.5 mg by nebulization every 6 (six) hours as needed for wheezing.     [provider]  ?albuterol (VENTOLIN HFA) 108 (90 Base) MCG/ACT inhaler Inhale 1 puff into the lungs every 4 (four) hours as needed for wheezing.     [provider]  ?atorvastatin (LIPITOR) 20 MG tablet Take 20 mg by mouth daily. 03/25/21   [provider]  ?cetirizine (ZYRTEC) 10 MG tablet Take 10 mg by mouth daily.    [provider]  ?Continuous Blood Gluc Sensor (FREESTYLE LIBRE 2 SENSOR) MISC 1 each by Does not apply route every 14 (fourteen) days. 02/12/21   Carlus Pavlov, MD  ?fluticasone (FLONASE) 50 MCG/ACT nasal spray Place 2 sprays into both nostrils 2 (two) times daily as needed for allergies. 03/05/21   [provider]  ?glucose blood (ACCU-CHEK GUIDE) test strip Use as instructed 4x a day 01/30/21   Carlus Pavlov, MD  ?insulin glargine (LANTUS SOLOSTAR) 100 UNIT/ML Solostar Pen ADMINISTER 24 TO 26 UNITS UNDER THE SKIN TWICE DAILY 04/15/21   Carlus Pavlov, MD  ?Karlene Einstein 145 MCG CAPS capsule Take 145 mcg by mouth daily as needed (constipation). 05/19/21   [provider]  ?lisinopril (PRINIVIL,ZESTRIL) 10 MG tablet Take 10 mg by mouth daily.      [provider]  ?NOVOLOG FLEXPEN 100 UNIT/ML FlexPen INJECT 0  TO 50 UNITS UNDER THE SKIN PER SLIDING SCALE FOUR TIMES DAILY WITH MAX DAILY DOSE OF 200 UNITS 04/29/21   Carlus Pavlov, MD  ?omega-3 acid ethyl esters (LOVAZA) 1 g capsule Take 2 capsules by mouth 2 (two) times daily. 03/18/21   [provider]  ?oxybutynin (DITROPAN) 5 MG tablet Take 5 mg by mouth 3 (three) times daily as needed for bladder spasms. Overactive bladder 08/04/21   [provider]  ?PULMICORT 0.5 MG/2ML nebulizer solution Take 2 mLs by nebulization daily as needed for wheezing. 04/26/16   [provider]  ?rizatriptan (MAXALT-MLT) 10 MG disintegrating tablet Take 10 mg by mouth as needed for headache. 10/31/19   [provider]  ?sulfamethoxazole-trimethoprim (BACTRIM DS) 800-160 MG tablet Take 1 tablet by mouth daily. 07/22/21   [provider]  ?testosterone cypionate (DEPOTESTOSTERONE CYPIONATE) 200 MG/ML injection Inject 50 mg into the muscle once a week. 0.25 mL    [provider]  ?tiZANidine (ZANAFLEX) 2 MG tablet Take 2 mg by mouth every 8 (eight) hours as needed for muscle spasms.     [provider]  ?traMADol (ULTRAM) 50 MG tablet Take 50-100 mg by mouth every 8 (eight) hours as needed for severe pain. 100 mg every night at bedtime    [provider]  ?Vitamin D, Ergocalciferol, (DRISDOL) 1.25 MG (50000 UNIT) CAPS capsule TAKE 1 CAPSULE BY MOUTH DAILY ?Patient taking differently: Take 50,000 Units by mouth daily. 02/18/20   Carlus Pavlov, MD  ?zolpidem (AMBIEN) 10 MG tablet Take 10 mg by mouth at bedtime as needed for sleep. 07/30/21   [provider]  ?   ? ?Allergies    ?Fenofibrate, Humalog [insulin lispro], Penicillins, and Morphine and related   ? ?Review of Systems   ?Review of Systems  ?All other systems reviewed and are negative. ?Ten systems reviewed and are negative for acute change, except as noted in the HPI.   ?Physical Exam ?Updated Vital Signs ?BP 129/80   Pulse 100   Temp 99 ?F (37.2 ?C) (Oral)   Resp 19   SpO2 99%  ?Physical Exam ?Vitals and nursing note reviewed.  ?Constitutional:   ?   General: He is not in acute distress. ?   Appearance: Normal appearance. He is not ill-appearing, toxic-appearing or diaphoretic.  ?HENT:  ?   Head: Normocephalic and atraumatic.  ?   Right Ear: External ear normal.  ?   Left Ear: External ear normal.  ?   Nose: Nose normal.  ?   Mouth/Throat:  ?   Mouth: Mucous membranes are moist.  ?   Pharynx: Oropharynx is clear. No oropharyngeal exudate or posterior oropharyngeal erythema.   ?Eyes:  ?   General: No scleral icterus.    ?   Right eye: No discharge.     ?   Left eye: No discharge.  ?   Extraocular Movements: Extraocular movements intact.  ?   Conjunctiva/sclera: Conjunctivae normal.  ?Neck:  ?   Comments: No midline cervical or thoracic spine pain noted. ?Cardiovascular:  ?   Rate and Rhythm: Normal rate and regular rhythm.  ?   Pulses: Normal pulses.  ?   Heart sounds: Normal heart sounds. No murmur heard. ?  No friction rub. No gallop.  ?Pulmonary:  ?   Effort: Pulmonary effort is normal. No respiratory distress.  ?   Breath sounds: Normal breath sounds. No stridor. No wheezing, rhonchi or rales.  ?Abdominal:  ?   General: There  is no distension.  ?Musculoskeletal:     ?   General: Tenderness present. Normal range of motion.  ?   Cervical back: Normal range of motion and neck supple. No tenderness.  ?   Comments: Moderate TTP noted along the midline lumbar spine and left lumbar paraspinal musculature.  No step-offs, crepitus, or deformities.  ?Skin: ?   General: Skin is warm and dry.  ?Neurological:  ?   General: No focal deficit present.  ?   Mental Status: He is alert and oriented to person, place, and time.  ?   Comments: Exam limited due to patient's pain.  Strength is 5/5 in the right leg.  Strength is 3/5 in the left leg.  Distal sensation intact to light touch.  Palpable pedal pulses.  ?Psychiatric:     ?   Mood and Affect: Mood normal.     ?   Behavior: Behavior normal.  ? ?ED Results / Procedures / Treatments   ?Labs ?(all labs ordered are listed, but only abnormal results are displayed) ?Labs Reviewed - No data to display ? ?EKG ?None ? ?Radiology ?No results found. ? ?Procedures ?Procedures  ? ?Medications Ordered in ED ?Medications - No data to display ? ?ED Course/ Medical Decision Making/ A&P ?  ?                        ?Medical Decision Making ?Amount and/or Complexity of Data Reviewed ?Radiology: ordered. ? ?Pt is a 46 y.o. adult with a complex medical history of  presents to the emergency department with lumbar back pain for the past 2 days after a mechanical fall at home. ? ?CT scan of the lumbar spine without contrast with findings as noted below: ? ?IMPRESSION: ?1. No acute abnormality

## 2021-11-07 NOTE — ED Triage Notes (Signed)
Pt states he slipped and fell on Wednesday due to unsteady gait and chronic back pain.  Reports lower back pain. ?

## 2021-11-07 NOTE — Discharge Instructions (Addendum)
I have prescribed you a strong narcotic called Percocet. Please only take this as prescribed. This medication also has tylenol in it, so please be sure you are not taking more than 3000 mg of tylenol per day. Do not drive or operate heavy machinery after taking this medication. Do not mix it with alcohol.  ? ?I am prescribing you a strong muscle relaxer called flexeril. Please only take this medication once in the evening with dinner. This medication can make you quite drowsy. Do not mix it with alcohol. Do not drive a vehicle after taking it.  ? ?DO NOT mix these medications with each other, Ambien, tramadol or or other muscle relaxers or narcotic pain medications! ? ?Below is the contact information for [facility].  Please give them a call and schedule an appointment for reevaluation of your low back pain. ? ?If you develop any new or worsening symptoms please do not hesitate to return to the emergency department. It was a pleasure to meet you. ? ? ?

## 2021-11-19 ENCOUNTER — Other Ambulatory Visit: Payer: Self-pay | Admitting: Neurosurgery

## 2021-11-19 DIAGNOSIS — M5416 Radiculopathy, lumbar region: Secondary | ICD-10-CM

## 2021-11-20 ENCOUNTER — Ambulatory Visit: Payer: Medicare Other | Admitting: Internal Medicine

## 2021-11-27 ENCOUNTER — Encounter: Payer: Self-pay | Admitting: Internal Medicine

## 2021-11-29 ENCOUNTER — Ambulatory Visit
Admission: RE | Admit: 2021-11-29 | Discharge: 2021-11-29 | Disposition: A | Discharge status: Home / Self Care | Payer: Medicare Other | Source: Ambulatory Visit | Attending: Neurosurgery | Admitting: Neurosurgery

## 2021-11-29 DIAGNOSIS — M5416 Radiculopathy, lumbar region: Secondary | ICD-10-CM

## 2021-11-30 ENCOUNTER — Ambulatory Visit: Payer: Medicare Other | Admitting: Internal Medicine

## 2021-12-01 ENCOUNTER — Encounter: Payer: Self-pay | Admitting: Internal Medicine

## 2021-12-01 ENCOUNTER — Ambulatory Visit (INDEPENDENT_AMBULATORY_CARE_PROVIDER_SITE_OTHER): Payer: Medicare Other | Admitting: Internal Medicine

## 2021-12-01 VITALS — BP 132/82 | HR 91 | Ht 68.0 in | Wt 333.0 lb

## 2021-12-01 DIAGNOSIS — E7849 Other hyperlipidemia: Secondary | ICD-10-CM | POA: Diagnosis not present

## 2021-12-01 DIAGNOSIS — E1142 Type 2 diabetes mellitus with diabetic polyneuropathy: Secondary | ICD-10-CM | POA: Diagnosis not present

## 2021-12-01 DIAGNOSIS — E559 Vitamin D deficiency, unspecified: Secondary | ICD-10-CM

## 2021-12-01 MED ORDER — VITAMIN D (ERGOCALCIFEROL) 1.25 MG (50000 UNIT) PO CAPS: ORAL_CAPSULE | ORAL | 3 refills | Status: DC

## 2021-12-01 NOTE — Patient Instructions (Addendum)
Please continue: ?- Lantus 24 units in am and 26 units at night  ?- NovoLog: ? - ICR 1:4, but 1:2 for starches if you reintroduce them ? - target: 150 ? - ISF: 150-200: 4 units, 200-230: 8 units, 230-and higher: 10 units ? ?Please come back for a follow-up appointment in 6 months. ?

## 2021-12-01 NOTE — Progress Notes (Signed)
Patient ID: [facility], adult   DOB: Sep 14, 1975, 46 y.o.   MRN: [name] ? ?This visit occurred during the SARS-CoV-2 public health emergency.  Safety protocols were in place, including screening questions prior to the visit, additional usage of staff PPE, and extensive cleaning of exam room while observing appropriate contact time as indicated for disinfecting solutions.  ? ?HPI: ?[facility] is a 46 y.o.-year-old adult, initially referred by his PCP, Dr. Theador HawthorneKirstein Smith, presenting for follow-up for DM2, dx in 1998 [age] y/o), insulin-dependent, uncontrolled, wit complications (PN). Last visit 6 months ago. ? ?Interim history: ?He continues to go to the gym: Walks, bicycle, strength. ?Before last visit, he eliminated starches (bread, rice, pasta) and diet sodas. He lost 31 lbs overall per scale at home. ?He sees neurology for peripheral neuropathy.  He was found to have severe peripheral neuropathy bilaterally up to knees.  He was advised to continue tramadol and restarted Cymbalta 10/05/2021 >> but she stopped Cymbalta again b/c was not effective. ?His rosacea is worse >> was on Bactrim for many years >> not helpful anymore >> will start another ABx.  ?He started to take testosterone sq instead of im >> liver tests and testosterone levels improved. ?He has back pain after a fall.  ? ?DM2: ?Reviewed history: ?He has a h/o autonomic neuropathy of the GI tract (dx in 1986, when he was 46 y/o - not related to DM - severe diarrhea: "food goes through me"), seeing Dr. [place] >> permanent colostomy - had sx in 09/2014.  ? ?Reviewed HbA1c levels: ?11/18/2020: HbA1c 6.9% ?Lab Results  ?Component Value Date  ? HGBA1C 7.2 (A) 05/18/2021  ? HGBA1C 5.7 (A) 05/16/2020  ? HGBA1C 6.0 03/20/2019  ?02/20/2020: HbA1c 6.0%. ?10/19/2019: HbA1c 6.7% ?12/07/2018: HbA1c 6.8% ?11/10/2016:The HbA1c calculated from the fructosamine was 7.3%, better than the measured HbA1c! ?02/28/2015: HbA1c 6.9% ?10/21/2014: HbA1c 6.9% - after changing from  Humalog to NovoLog  ?07/23/2014: HbA1c 10.9% ?ICA Abs negative (01/31/2012) ?C-pp 5.0, Glu 218 (01/25/2012) ? ?She had high HbA1c levels in the past as his insurance did not approve NovoLog and was forced to change to Humalog/Humulin.  Since then, we need to send a preauthorization for NovoLog to her insurance every year. ? ?He patient previously is on: ?- Levemir ... >> Lantus 24 units in am and 26 units at night ?- NovoLog: ? - ICR 1:4, but 1:2 for starches (eliminated starches) ? - target: 150 ? - ISF: 150-200: 4 units, 200-230: 8 units, 230-and higher: 10 units ?He cannot digest pills;  tried oral meds in the past >> "they went through me". ?He also tried Lantus >> this did not work for him >> sugars unchanged despite increasing the dose.  ?He tried [place] but sugars were higher. ? ?He checks her sugars more than 4 times a day with the freestyle libre 2 CGM: ? ? ?Previously: ? ? ?Previously: ? ? ?Lowest blood sugar: 42, 59 ...>> 60s  >> 60s. She has hypoglycemia awareness in the 60s. ?Highest sugars recently 400 (after breast sx)... >> 200s >> 300s. ? ?-No CKD: ?11/18/2021: 13/1.01, GFR 71, glucose 151, ACR 23 ?10/29/2020: Glucose 146, BUN/creatinine 16/1.06, GFR 67, ACR 7 ?02/20/2020:Glucose 127, BUN/creatinine 11/1.08, GFR 63 ?10/19/2019: Glucose 174, BUN/creatinine 10/1.10, GFR 63 ?06/16/2018: glucose 120, BUN/creatinine 14/0.84 ?Lab Results  ?Component Value Date  ? BUN 16 07/15/2017  ? ?Lab Results  ?Component Value Date  ? CREATININE 0.81 07/15/2017  ?02/03/2016: 13/0.93, Glu 183 ?07/02/2015: CMP: Normal, except glucose  216, BUN/creatinine 10/0.81, GFR 93, ALT 39 (0-32), bilirubin 1.3 (0-1.2) ?07/23/2014: BUN/creatinine 12/0.9 ?Lab Results  ?Component Value Date  ? MICRALBCREAT 0.9 07/15/2017  ? MICRALBCREAT 0.5 08/12/2015  ?On lisinopril. ? ?-+ HL; last set of lipids: ?10/29/2020: 162/127/57/83 ?02/20/2020: 145/173/47/75 ?10/19/2019: 149/155/50/72 ?Lab Results  ?Component Value Date  ? CHOL 180 03/20/2019  ? HDL  48 03/20/2019  ? LDLCALC 97 03/20/2019  ? LDLDIRECT 119.0 07/15/2017  ? TRIG 173 (A) 03/20/2019  ? CHOLHDL 5 07/15/2017  ?02/03/2016: 205/284/55/93 ?07/02/2015: 206/229/63/97  ?07/23/2014: 324/868/42/171 ?AST/ALT: 121/76 ?He was taken off statins due to hemochromatosis >> now restarted Lipitor 10 mg daily. She had muscle aches on fenofibrate.  Currently on omega-3 fatty acids. ? ?- last eye exam was in 06/2020: No DR reportedly [facility]).  ? ?-+ he has Numbness and tingling in his feet - and L hand numbness and pain.  He sees a podiatrist and neurology.  On Neurontin cream and Cymbalta - then restarted Cymbalta >> stopped again b/c ineffective. ? ?Vitamin D def.: ? ?Reviewed vitamin D levels: ?11/18/2021 vitamin D 29.6 - ran out of ergocalciferol ?10/29/2020: Vitamin D 55.9 ?02/20/2020: Vitamin D 47.9 ?Lab Results  ?Component Value Date  ? VD25OH 64.54 09/15/2018  ? VD25OH 31.25 07/15/2017  ? VD25OH 45.42 11/10/2016  ? VD25OH 35.30 05/31/2016  ? VD25OH 23.86 (L) 08/12/2015  ? VD25OH 19.32 (L) 11/28/2014  ?Vit D 23.5 in 02/03/2016 - on Ergocalciferol 50,000 IU daily. ?Vit D 13.9 in 06/2014 - despite being on Ergocalciferol 50,000 IU 2x a week. ?Vit D 19.3 in 11/2014 - repeat, on Ergocalciferol 50,000 IU 2x a week >> dose increased to 4 times a week ?Vit D 23.86 in 07/2015 - on ergocalciferol 50,000 IU 4x a week >> dose increased to daily  ? ?He continues on ergocalciferol 50,000 units daily - now out. ? ?Latest TSH was normal: ?10/29/2020: TSH 1.83 ?Lab Results  ?Component Value Date  ? TSH 0.762 07/23/2016  ? ?He continues on testosterone for male to male transition.  He felt much better after starting this.  Had B mastectomy in 06/2019.  This was an extensive surgery.  He also had hysterectomy in 02/2021. ?He also has hemochromatosis, allergy-triggered asthma, HTN, OSA-on CPAP, transaminitis, GERD, depression.  He  is on Depo-Provera. ? ?He usually comes to the appointments with her service dog, Gavin Smith,  who  played in [place] musical and also in [place]. 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 ? ?ROS: ?+ See HPI ?Cardiovascular: no CP/no SOB/no palpitations/no leg swelling, + B arm swelling after lymphectomy after  mastectomy ?Neurological: no tremors/+ numbness/+ tingling/no dizziness ? ?I reviewed pt's medications, allergies, PMH, social hx, family hx, and changes were documented in the history of present illness. Otherwise, unchanged from my initial visit note. ? ?Past Medical History:  ?Diagnosis Date  ? Allergic rhinitis   ? Asperger's syndrome 06/26/2019  ? Asthma exacerbation 07/22/2016  ? Carpal tunnel syndrome, bilateral 06/27/2020  ? Colostomy in place [facility])   ? Diabetic gastropathy (HCC)   ? Diabetic neuropathy, type II diabetes mellitus (HCC)   ? Diabetic peripheral neuropathy (HCC) 07/15/2015  ? Encounter for postoperative care 03/12/2021  ? Essential hypertension, benign   ? Extrinsic asthma, unspecified   ? Family history of breast cancer gene mutation in first degree relative 05/29/2019  ? Food intolerance   ? Gender dysphoria 12/19/2018  ? Formatting of this note might be different from the original. Trans Male  Assessment and Plan  Pronoun Use: he/him Preferred Name: [facility] Preferred Anatomical Terminology: scientific Legal Documentation Corrected: None  Hormone Start Date: Dec 13, 2018 Current Testosterone Dose: 50 mg cypionate Mode of Delivery (SQ vs IM): IM Injection Schedule (Weekly?  Twice a week?): weekly  Recommended Monitoring S  ? Generalized osteoarthrosis, involving multiple sites   ? History of colostomy   ? Hormone replacement therapy   ? HTN (hypertension), benign   ? Hyperlipidemia   ? IDDM (insulin dependent diabetes mellitus) 06/16/2018  ? Inappropriate sinus tachycardia   ? Incisional hernia with obstruction but no gangrene 07/22/2017  ? Insomnia, unspecified   ? Lumbar spinal stenosis 07/15/2015  ? Morbid obesity with BMI of 45.0-49.9, adult (HCC)   ? Muscular  dystrophy (HCC) 07/09/2021  ? Formatting of this note might be different from the original. diagnosed age 58 yrs  ? Neuropathy 02/27/2015  ? Other hyperlipidemia   ? Pityriasis rosea   ? Pure hypercholesterolemia

## 2021-12-03 MED ORDER — VITAMIN D (ERGOCALCIFEROL) 1.25 MG (50000 UNIT) PO CAPS: 50000.0000 [IU] | ORAL_CAPSULE | ORAL | 3 refills | Status: DC

## 2021-12-03 NOTE — Addendum Note (Signed)
Addended by: Lauralyn Primes on: 12/03/2021 08:34 AM ? ? Modules accepted: Orders ? ?

## 2021-12-07 ENCOUNTER — Ambulatory Visit: Payer: Medicare Other | Admitting: Internal Medicine

## 2021-12-14 ENCOUNTER — Ambulatory Visit: Payer: Medicare Other | Admitting: Internal Medicine

## 2021-12-19 ENCOUNTER — Other Ambulatory Visit: Payer: Self-pay | Admitting: Internal Medicine

## 2021-12-29 ENCOUNTER — Encounter: Payer: Self-pay | Admitting: Internal Medicine

## 2021-12-29 ENCOUNTER — Other Ambulatory Visit: Payer: Self-pay | Admitting: Internal Medicine

## 2021-12-29 DIAGNOSIS — E559 Vitamin D deficiency, unspecified: Secondary | ICD-10-CM

## 2021-12-29 MED ORDER — VITAMIN D (ERGOCALCIFEROL) 1.25 MG (50000 UNIT) PO CAPS: 50000.0000 [IU] | ORAL_CAPSULE | Freq: Every day | ORAL | 3 refills | Status: DC

## 2022-01-05 ENCOUNTER — Telehealth: Payer: Self-pay

## 2022-01-05 NOTE — Telephone Encounter (Signed)
Inbound fax from DME supplier requesting form be completed and faxed with clinical notes. DME supplies ordered via [employer] through online portal.  

## 2022-01-14 ENCOUNTER — Other Ambulatory Visit: Payer: Self-pay | Admitting: Internal Medicine

## 2022-01-14 ENCOUNTER — Telehealth: Payer: Self-pay | Admitting: Pharmacy Technician

## 2022-01-14 ENCOUNTER — Other Ambulatory Visit (HOSPITAL_COMMUNITY): Payer: Self-pay

## 2022-01-14 NOTE — Telephone Encounter (Signed)
Working on [place]. But the pt could get the Vit D 50000U at one of our pharmacies for $28.95.

## 2022-01-14 NOTE — Telephone Encounter (Signed)
Patient Advocate Encounter   Received notification from [facility]MA that prior authorization for Vit D Capsules is required by his/her insurance [employer].  Per Test Claim: Not covered under Part D.   PA submitted on 01/14/22 Key [id number] Status is pending

## 2022-01-27 NOTE — Telephone Encounter (Signed)
T, Can you please talk to the pt about this and let her know that she can get it over the counter - see below. Ty! C

## 2022-01-29 MED ORDER — VITAMIN D-3 125 MCG (5000 UT) PO TABS: 50000.0000 [IU] | ORAL_TABLET | Freq: Every day | ORAL | 0 refills | Status: DC

## 2022-01-29 NOTE — Telephone Encounter (Signed)
Lvm and sent MyChart message to pt with provider recommendation.

## 2022-01-29 NOTE — Addendum Note (Signed)
Addended by: Kenyon Ana on: 01/29/2022 05:14 PM   Modules accepted: Orders

## 2022-02-17 ENCOUNTER — Ambulatory Visit: Payer: Medicare Other | Admitting: Cardiology

## 2022-03-02 ENCOUNTER — Ambulatory Visit: Payer: Medicare Other | Admitting: Cardiology

## 2022-03-17 ENCOUNTER — Other Ambulatory Visit: Payer: Self-pay | Admitting: Internal Medicine

## 2022-03-20 ENCOUNTER — Encounter: Payer: Self-pay | Admitting: Internal Medicine

## 2022-05-14 ENCOUNTER — Ambulatory Visit: Payer: Self-pay | Admitting: Licensed Clinical Social Worker

## 2022-05-14 NOTE — Patient Instructions (Signed)
Visit Information  Thank you for taking time to visit with me today. Please don't hesitate to contact me if I can be of assistance to you.   Following are the goals we discussed today:   Goals Addressed               This Visit's Progress     Care Coordination Activities- Patient declined (pt-stated)        Patient declined needing any services at this time.  SDOH assessment completed.           Patient verbalizes understanding of instructions and care plan provided today and agrees to view in [facility]. Active MyChart status and patient understanding of how to access instructions and care plan via MyChart confirmed with patient.     No further follow up required: .  Milus Height, Arita Miss , MSW, [place] Social Worker [facility] Care Management  [phone]

## 2022-06-01 ENCOUNTER — Ambulatory Visit (INDEPENDENT_AMBULATORY_CARE_PROVIDER_SITE_OTHER): Payer: Medicare Other | Admitting: Internal Medicine

## 2022-06-01 ENCOUNTER — Encounter: Payer: Self-pay | Admitting: Internal Medicine

## 2022-06-01 VITALS — BP 132/78 | HR 80 | Ht 68.0 in | Wt 311.8 lb

## 2022-06-01 DIAGNOSIS — E7849 Other hyperlipidemia: Secondary | ICD-10-CM | POA: Diagnosis not present

## 2022-06-01 DIAGNOSIS — E559 Vitamin D deficiency, unspecified: Secondary | ICD-10-CM | POA: Diagnosis not present

## 2022-06-01 DIAGNOSIS — E1142 Type 2 diabetes mellitus with diabetic polyneuropathy: Secondary | ICD-10-CM | POA: Diagnosis not present

## 2022-06-01 LAB — POCT GLYCOSYLATED HEMOGLOBIN (HGB A1C): Hemoglobin A1C: 7.1 % — AB (ref 4.0–5.6)

## 2022-06-01 MED ORDER — FREESTYLE LIBRE 3 SENSOR MISC: 1.0000 | 3 refills | Status: DC

## 2022-06-01 NOTE — Progress Notes (Signed)
Patient ID: Gavin BodilyJackson [name], adult   DOB: 24-Mar-1976, 46 y.o.   MRN: [id number]  HPI: Gavin Smith is a 46 y.o.-year-old adult, initially referred by his PCP, Dr. Theador HawthorneKirstein Smith, presenting for follow-up for DM2, dx Gavin 411998 10(46 y/o), insulin-dependent, uncontrolled, wit complications (PN). Last visit 6 months ago.  Interim history:   He now has a girlfriend.  Approximately 5 weeks ago, he started to walk with his girlfriend-walking 4 to 5 miles a day and completing all the circles on his watch.  He already lost 22 pounds.  They also joined [employer] together.  His goal weight loss is 250 pounds. He is feeling great, without complaints today. After starting to walk, sugars started to drop so he is unable to take his mealtime insulin as previously prescribed.  He is working on changing his insulin to carb ratios.  DM2: Reviewed history: He has a h/o autonomic neuropathy of the GI tract (dx Gavin 1986, when he was 46 y/o - not related to DM - severe diarrhea: "food goes through me"), seeing Gavin Smith - had sx Gavin 09/2014.   Reviewed HbA1c levels: 03/17/2022: HbA1c 6.7% 11/18/2020: HbA1c 6.9% Lab Results  Component Value Date   HGBA1C 7.2 (A) 05/18/2021   HGBA1C 5.7 (A) 05/16/2020   HGBA1C 6.0 03/20/2019  02/20/2020: HbA1c 6.0%. 10/19/2019: HbA1c 6.7% 12/07/2018: HbA1c 6.8% 11/10/2016:The HbA1c calculated from the fructosamine was 7.3%, better than the measured HbA1c! 02/28/2015: HbA1c 6.9% 10/21/2014: HbA1c 6.9% - after changing from Humalog to NovoLog  07/23/2014: HbA1c 10.9% ICA Abs negative (01/31/2012) C-pp 5.0, Glu 218 (01/25/2012)  She had high HbA1c levels Gavin the past as his insurance did not approve NovoLog and was forced to change to Humalog/Humulin.  Since then, we need to send a preauthorization for NovoLog to her insurance every year.  He patient previously is on: - Levemir ... >> Lantus 24 units Gavin am and 26 units at night - NovoLog:  - ICR 1:4, but 1:2 for  starches (eliminated starches) >> ~1:6  - target: 150  - ISF: 150-200: 4 units, 200-230: 8 units, 230-and higher: 10 units He cannot digest pills;  tried oral meds Gavin the past >> "they went through me". He also tried Lantus >> this did not work for him >> sugars unchanged despite increasing the dose.  He tried [place] but sugars were higher.  He checks her sugars more than 4 times a day with the freestyle libre 2 CGM:   Previously:   Lowest blood sugar: 42, 59 ...>> 60s  >> 60s. She has hypoglycemia awareness Gavin the 60s. Highest sugars recently 400 (after breast sx)... >> 200s >> 300s.  -No CKD: 03/17/2022: 14/1, GFR 17, ACR 95, glucose 147 11/18/2021: 13/1.01, GFR 71, glucose 151, ACR 23 10/29/2020: Glucose 146, BUN/creatinine 16/1.06, GFR 67, ACR 7 02/20/2020:Glucose 127, BUN/creatinine 11/1.08, GFR 63 10/19/2019: Glucose 174, BUN/creatinine 10/1.10, GFR 63 06/16/2018: glucose 120, BUN/creatinine 14/0.84 Lab Results  Component Value Date   BUN 16 07/15/2017   Lab Results  Component Value Date   CREATININE 0.81 07/15/2017  02/03/2016: 13/0.93, Glu 183 07/02/2015: CMP: Normal, except glucose 216, BUN/creatinine 10/0.81, GFR 93, ALT 39 (0-32), bilirubin 1.3 (0-1.2) 07/23/2014: BUN/creatinine 12/0.9 Lab Results  Component Value Date   MICRALBCREAT 0.9 07/15/2017   MICRALBCREAT 0.5 08/12/2015  On lisinopril.  -+ HL; last set of lipids: 03/17/2022: 172/147/57/90 10/29/2021: 162/127/57/83 02/20/2020: 145/173/47/75 10/19/2019: 149/155/50/72 Lab Results  Component Value Date   CHOL 180 03/20/2019  HDL 48 03/20/2019   LDLCALC 97 03/20/2019   LDLDIRECT 119.0 07/15/2017   TRIG 173 (A) 03/20/2019   CHOLHDL 5 07/15/2017  02/03/2016: 205/284/55/93 07/02/2015: 206/229/63/97  07/23/2014: 324/868/42/171 AST/ALT: 121/76 He was taken off statins due to hemochromatosis >> now restarted Lipitor 10 mg daily. She had muscle aches on fenofibrate.  Currently on omega-3 fatty acids.  - last  eye exam was Gavin 06/2020: No DR reportedly [facility]).   -+ he has Numbness and tingling Gavin his feet - and L hand numbness and pain.  He sees a podiatrist and neurology.  On Neurontin cream and Cymbalta - then restarted Cymbalta >> stopped again b/c ineffective.  Vitamin D def.:  Reviewed vitamin D levels: 03/17/2022: Vitamin D 51.6 11/18/2021 vitamin D 29.6 - ran out of ergocalciferol 10/29/2020: Vitamin D 55.9 02/20/2020: Vitamin D 47.9 Lab Results  Component Value Date   VD25OH 64.54 09/15/2018   VD25OH 31.25 07/15/2017   VD25OH 45.42 11/10/2016   VD25OH 35.30 05/31/2016   VD25OH 23.86 (L) 08/12/2015   VD25OH 19.32 (L) 11/28/2014  Vit D 23.5 Gavin 02/03/2016 - on Ergocalciferol 50,000 IU daily. Vit D 13.9 Gavin 06/2014 - despite being on Ergocalciferol 50,000 IU 2x a week. Vit D 19.3 Gavin 11/2014 - repeat, on Ergocalciferol 50,000 IU 2x a week >> dose increased to 4 times a week Vit D 23.86 Gavin 07/2015 - on ergocalciferol 50,000 IU 4x a week >> dose increased to daily   He continues on ergocalciferol 50,000 units daily.  Latest TSH was normal: 10/29/2020: TSH 1.83 Lab Results  Component Value Date   TSH 0.762 07/23/2016   He continues on testosterone for male to male transition.  He felt much better after starting this.  Had B mastectomy Gavin 06/2019.  This was an extensive surgery.  He also had hysterectomy Gavin 02/2021.  He started to take testosterone sq instead of im >> liver tests and testosterone levels improved. Latest testosterone 575 (at goal) 03/17/2022. He also has hemochromatosis, allergy-triggered asthma, HTN, OSA-on CPAP, transaminitis, GERD, depression.  He  is on Depo-Provera. Before our visit at the end of 2022, he eliminated starches (bread, rice, pasta) and diet sodas. He lost 31 lbs overall per scale at home. He has rosacea >> was on Bactrim for many years >> not helpful anymore >> on antibiotics per dermatology.   He usually comes to the appointments with her service  dog, Marily Memos,  who played Gavin [place] musical and also [place]. 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  ROS: + See HPI Cardiovascular: no CP/no SOB/no palpitations/no leg swelling, + B arm swelling after lymphectomy after  mastectomy.  I reviewed pt's medications, allergies, PMH, social hx, family hx, and changes were documented Gavin the history of present illness. Otherwise, unchanged from my initial visit note.  Past Medical History:  Diagnosis Date   Allergic rhinitis    Asperger's syndrome 06/26/2019   Asthma exacerbation 07/22/2016   Carpal tunnel syndrome, bilateral 06/27/2020   [facility])    Diabetic gastropathy ([place])    Diabetic neuropathy, type II diabetes mellitus ([place])    Diabetic peripheral neuropathy ([place]) 07/15/2015   Encounter for postoperative care 03/12/2021   Essential hypertension, benign    Extrinsic asthma, unspecified    Family history of breast cancer gene mutation Gavin first degree relative 05/29/2019   Food intolerance    Gender dysphoria 12/19/2018   Formatting of this note might be different  from the original. Trans Male Assessment and Plan  Pronoun Use: he/him Preferred Name: Gavin Smith Preferred Anatomical Terminology: scientific Legal Documentation Corrected: None  Hormone Start Date: Dec 13, 2018 Current Testosterone Dose: 50 mg cypionate Mode of Delivery (SQ vs IM): IM Injection Schedule (Weekly?  Twice a week?): weekly  Recommended Monitoring S   Generalized osteoarthrosis, involving multiple sites    History of Smith    Hormone replacement therapy    HTN (hypertension), benign    Hyperlipidemia    IDDM (insulin dependent diabetes mellitus) 06/16/2018   Inappropriate sinus tachycardia    Incisional hernia with obstruction but no gangrene 07/22/2017   Insomnia, unspecified    Lumbar spinal stenosis 07/15/2015   Morbid obesity with BMI of 45.0-49.9, adult (HCC)    Muscular dystrophy (HCC) 07/09/2021   Formatting of this  note might be different from the original. diagnosed age 75 yrs   Neuropathy 02/27/2015   Other hyperlipidemia    Pityriasis rosea    Pure hypercholesterolemia    Pure hypercholesterolemia    Sleep apnea    Type 2 diabetes, controlled, with peripheral neuropathy (HCC)    Ulnar neuritis, left    Urinary retention with incomplete bladder emptying    Vitamin D deficiency    Past Surgical History:  Procedure Laterality Date   BREAST REDUCTION SURGERY  2005   CHOLECYSTECTOMY  2000   IR CATHETER TUBE CHANGE  08/31/2021   RHINOPLASTY  2005   SPINAL FUSION  2006   L5-S1 spinal fusion   History   Social History   Marital Status: Single    Spouse Name: N/A   Number of Children: 0   Years of Education: 14   Occupational History   Prev. EMS, now disabled   Social History Main Topics   Smoking status: Never Smoker    Smokeless tobacco: Not on file   Alcohol Use: No   Drug Use: No   Current Outpatient Medications on File Prior to Visit  Medication Sig Dispense Refill   Accu-Chek FastClix Lancets MISC USE TO CHECK BLOOD SUGAR 4 TIMES A DAY 300 each 3   albuterol (PROVENTIL) (2.5 MG/3ML) 0.083% nebulizer solution Take 2.5 mg by nebulization every 6 (six) hours as needed for wheezing.      albuterol (VENTOLIN HFA) 108 (90 Base) MCG/ACT inhaler Inhale 1 puff into the lungs every 4 (four) hours as needed for wheezing.      atorvastatin (LIPITOR) 20 MG tablet Take 20 mg by mouth daily.     cetirizine (ZYRTEC) 10 MG tablet Take 10 mg by mouth daily.     Cholecalciferol (VITAMIN D-3) 125 MCG (5000 UT) TABS Take 50,000 Units by mouth daily. 30000 tablet 0   Continuous Blood Gluc Sensor (FREESTYLE LIBRE 2 SENSOR) MISC 1 each by Does not apply route every 14 (fourteen) days. 6 each 3   cyclobenzaprine (FLEXERIL) 10 MG tablet Take 1 tablet (10 mg total) by mouth 2 (two) times daily as needed for muscle spasms. 10 tablet 0   fluticasone (FLONASE) 50 MCG/ACT nasal spray Place 2 sprays into both  nostrils 2 (two) times daily as needed for allergies.     glucose blood (ACCU-CHEK GUIDE) test strip Use as instructed 4x a day 300 each 3   insulin glargine (LANTUS SOLOSTAR) 100 UNIT/ML Solostar Pen ADMINISTER 24 TO 26 UNITS UNDER THE SKIN TWICE DAILY 30 mL 3   Insulin Pen Needle (B-D ULTRAFINE III SHORT PEN) 31G X 8 MM MISC USE AS DIRECTED(8 TIMES  DAILY) 500 each 3   LINZESS 145 MCG CAPS capsule Take 145 mcg by mouth daily as needed (constipation).     lisinopril (PRINIVIL,ZESTRIL) 10 MG tablet Take 10 mg by mouth daily.       NOVOLOG FLEXPEN 100 UNIT/ML FlexPen INJECT UPTO 50 UNITS UNDER THE SKIN FOUR TIMES DAILY PER SLIDING SCALE(MAX 200 UNITS DAILY) 180 mL 0   omega-3 acid ethyl esters (LOVAZA) 1 g capsule Take 2 capsules by mouth 2 (two) times daily.     oxybutynin (DITROPAN) 5 MG tablet Take 5 mg by mouth 3 (three) times daily as needed for bladder spasms. Overactive bladder     oxyCODONE-acetaminophen (PERCOCET/ROXICET) 5-325 MG tablet Take 1 tablet by mouth every 8 (eight) hours as needed for severe pain. 6 tablet 0   PULMICORT 0.5 MG/2ML nebulizer solution Take 2 mLs by nebulization daily as needed for wheezing.     rizatriptan (MAXALT-MLT) 10 MG disintegrating tablet Take 10 mg by mouth as needed for headache.     sulfamethoxazole-trimethoprim (BACTRIM DS) 800-160 MG tablet Take 1 tablet by mouth daily.     testosterone cypionate (DEPOTESTOSTERONE CYPIONATE) 200 MG/ML injection Inject 50 mg into the muscle once a week. 0.25 mL     tiZANidine (ZANAFLEX) 2 MG tablet Take 2 mg by mouth every 8 (eight) hours as needed for muscle spasms.      traMADol (ULTRAM) 50 MG tablet Take 50-100 mg by mouth every 8 (eight) hours as needed for severe pain. 100 mg every night at bedtime     Vitamin D, Ergocalciferol, (DRISDOL) 1.25 MG (50000 UNIT) CAPS capsule Take 1 capsule (50,000 Units total) by mouth daily. 90 capsule 3   zolpidem (AMBIEN) 10 MG tablet Take 10 mg by mouth at bedtime as needed for  sleep.     No current facility-administered medications on file prior to visit.   Allergies  Allergen Reactions   Fenofibrate Other (See Comments)    Severe leg pain   Humalog [Insulin Lispro] Other (See Comments)    Drug tolerance >> ineffectiveness! Pt needs NovoLog instead!   Penicillins     Family History    Morphine And Related Rash   Family History  Problem Relation Age of Onset   Diabetes Mother    Hypertension Mother    Hyperlipidemia Mother    Hypertension Father    Hyperlipidemia Father    Diabetes Maternal Grandmother    Cancer Maternal Grandmother        Breast Cancer   Hypertension Maternal Grandfather    Heart disease Maternal Grandfather    Heart disease Paternal Grandfather    Cancer Other        Ovarian Cancer-Grandparent   PE: BP 132/78 (BP Location: Right Arm, Patient Position: Sitting, Cuff Size: Normal)   Pulse 80   Ht 5\' 8"  (1.727 m)   Wt (!) 311 lb 12.8 oz (141.4 kg)   SpO2 97%   BMI 47.41 kg/m   Wt Readings from Last 3 Encounters:  06/01/22 (!) 311 lb 12.8 oz (141.4 kg)  12/01/21 (!) 333 lb (151 kg)  08/31/21 (!) 332 lb (150.6 kg)   Constitutional: overweight, Gavin NAD Eyes:  EOMI, no exophthalmos ENT: no neck masses, no cervical lymphadenopathy Cardiovascular: RRR, No MRG Respiratory: CTA B Musculoskeletal: no deformities Skin:no rashes Neurological: no tremor with outstretched hands  ASSESSMENT: 1. DM2, insulin-dependent, uncontrolled, with complications - PN  - Patient had a lot of problems controlling his diabetes over the years due to unpredictability  of retaining the food he ate Gavin the setting of autonomic GI neuropathy.    Humalog did not work for him >> sugars >200 constantly. Since he cannot use Humalog, we sent and then re-sent a PA for NovoLog. NovoLog was finally approved, but this form has to be sent every year!  2. Vit D def  PLAN:  1. Patient with improved control Gavin the last several years, but previously very poor  control of the parastomal hernia edema missing insulin doses.  Sugars improved further after starting a freestyle libre CGM, with a nadir A1c of 5.7% at last visit, however, HbA1c was 6.9%, improved from 7.2%.  At that time, sugars are more variable overnight and Gavin the first part of the day per review of his CGM downloads and he also had a more consistent increase Gavin blood sugars after lunch.  However, there were no clear trend Gavin the first half of the day and overnight and I did not suggest a change Gavin his Lantus dose but did discuss about not over correcting high blood sugars at bedtime.  He did feel at that time that some of his high blood sugars may have been related to back pain. CGM interpretation: -At today's visit, we reviewed his CGM downloads: It appears that 42% of values are Gavin target range (goal >70%), while 55% are higher than 180 (goal <25%), and 3% are lower than 70 (goal <4%).  The calculated average blood sugar is 182.  The projected HbA1c for the next 3 months (GMI) is 7.7%. -Reviewing the CGM trends, sugars appear to be fluctuating around the upper limit of the target range, 180, but increasing especially after dinner.  They do drop overnight, after approximately 2 AM. -Since he describes that he cannot take his mealtime insulin during the day if he stays active, we will go ahead and back off his Lantus dose. -We also discussed about his meals.  These are usually low-carb.  Occasionally, they contain no carbs.  Usually he does not bolus insulin for these meals.  We discussed that ideally he would calculate 50% of the protein amount as carbs and bolus for them.  He was not aware of this.  He will try to do so.  I believe that this will bring his blood sugars after meals Gavin a more acceptable range. - I suggested to:  Patient Instructions  Please change: - Lantus 20 units Gavin am and 20 units at night  - NovoLog:  - ICR 1:6, but if you eat a low carb meal, calculate 50% of protein as carbs    - target: 150  - ISF: 150-200: 4 units, 200-230: 8 units, 230-and higher: 10 units  Please come back for a follow-up appointment Gavin 6 months.     - we checked his HbA1c: 7.1% (slightly higher) - advised to check sugars at different times of the day - 4x a day, rotating check times - advised for yearly eye exams >> he is UTD - return to clinic Gavin 6 months  2. HL -Reviewed latest lipid panel from 02/2022: At goal -He tried fenofibrate Gavin the past but had side effects.  Now on omega-3 fatty acids.  3. Vit D def -He continues on ergocalciferol 50,000 units daily -Latest vitamin D level was normal when checked earlier this summer -We will continue the current regimen for now  Carlus Pavlov, MD PhD [facility] Endocrinology

## 2022-06-01 NOTE — Patient Instructions (Addendum)
Please change: - Lantus 20 units in am and 20 units at night  - NovoLog:  - ICR 1:6, but if you eat a low carb meal, calculate 50% of protein as carbs   - target: 150  - ISF: 150-200: 4 units, 200-230: 8 units, 230-and higher: 10 units  Please come back for a follow-up appointment in 6 months.

## 2022-06-06 IMAGING — CT CT L SPINE W/O CM
4 of 6 series · 15 of 33 positions shown, 16 images · non-contrast
Comparison: None available.

CLINICAL DATA: Initial evaluation for acute low back pain.



[Series 6: orthogonal · axial · 0.21mm/px · z∈[-976,-806]mm · 4 of 135 slices shown, 5 images]
[im 27/135  soft-tissue]
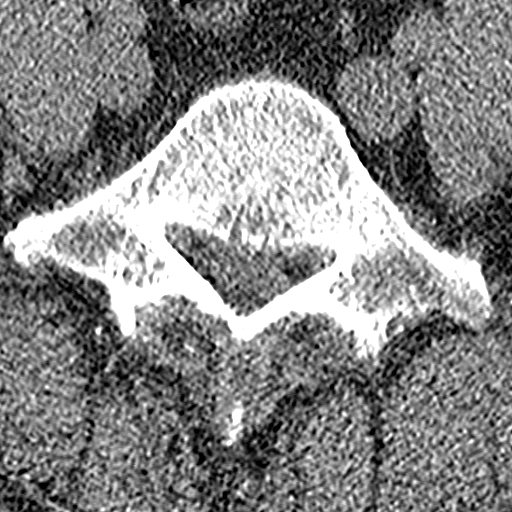
[im 27/135  bone]
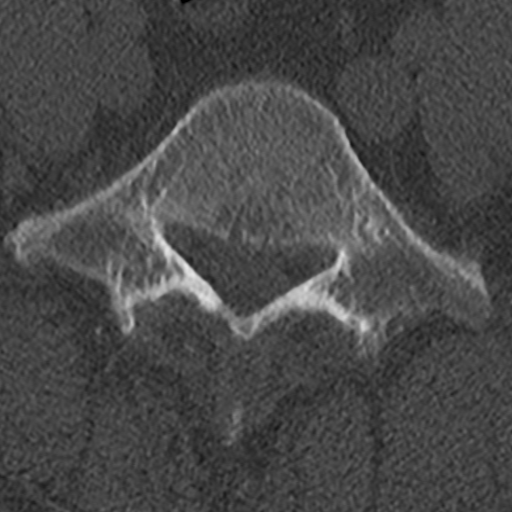
[im 54/135  bone]
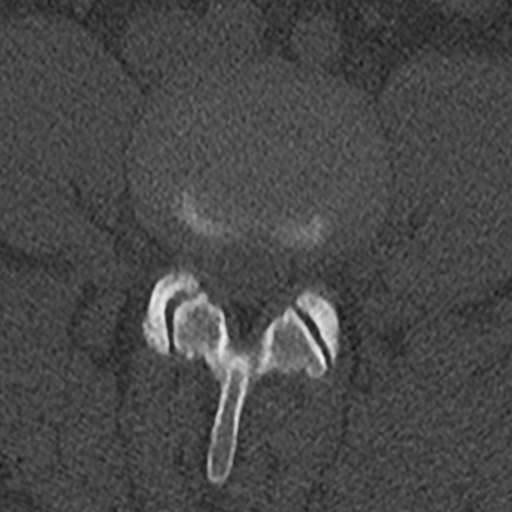
[im 81/135  bone]
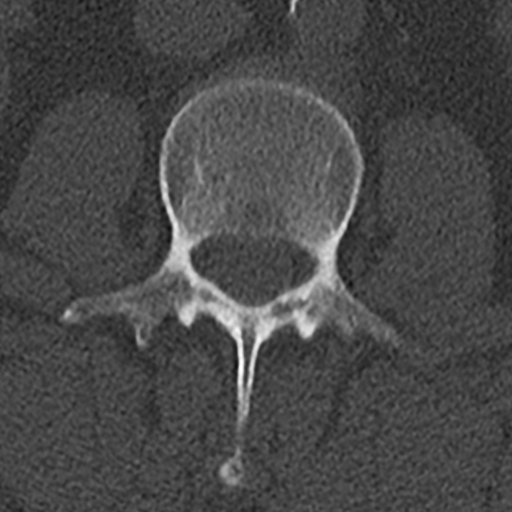
[im 108/135  bone]
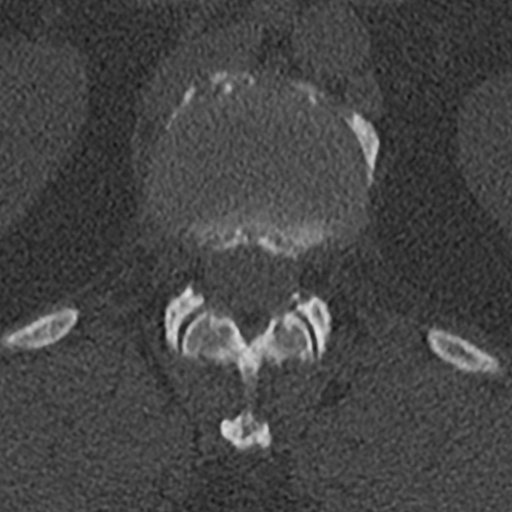

[Series 7: l spine soft · axial · 0.46mm/px · z∈[-1028,-848]mm · 5 of 159 slices shown]
[im 23/159  soft-tissue]
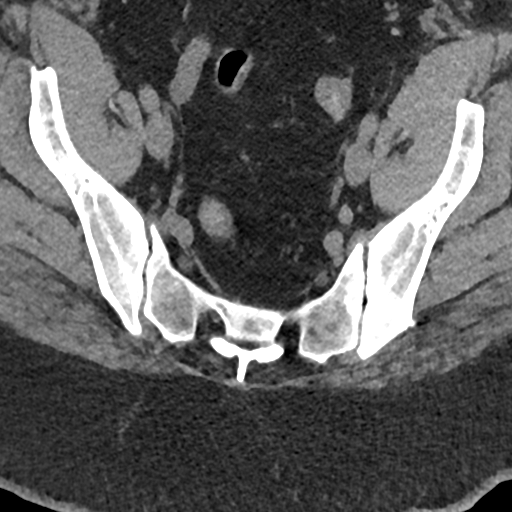
[im 46/159  soft-tissue]
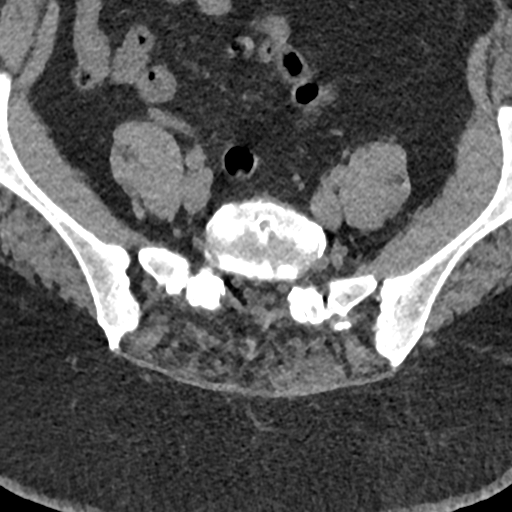
[im 68/159  soft-tissue]
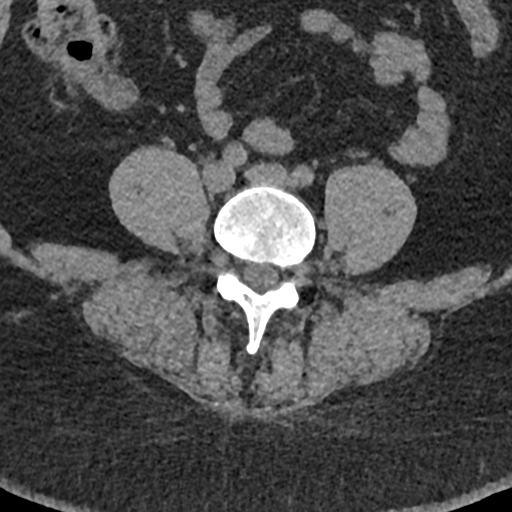
[im 91/159  soft-tissue]
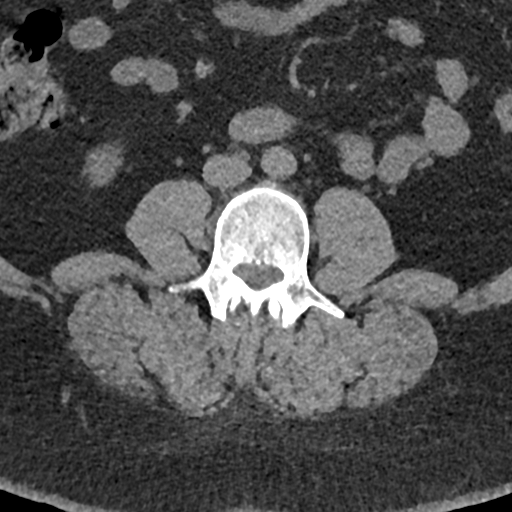
[im 113/159  soft-tissue]
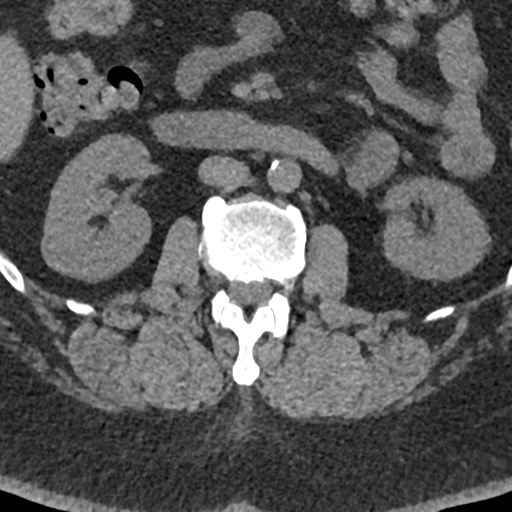

[Series 8: sag bone · sagittal · 0.49mm/px · 5 of 118 slices shown]
[im 20/118  bone]
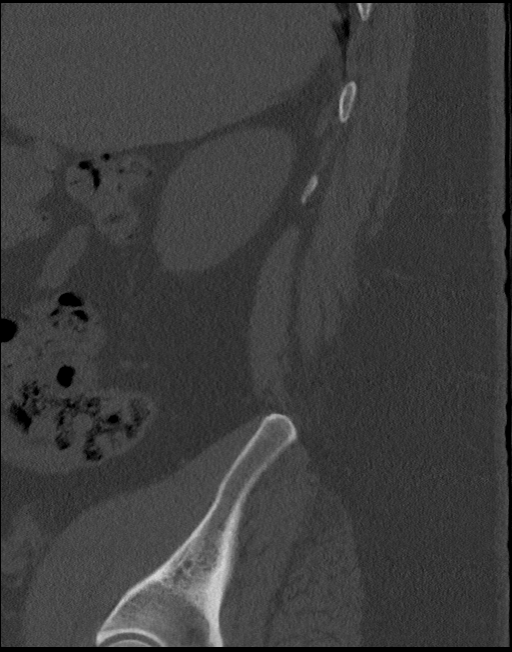
[im 40/118  bone]
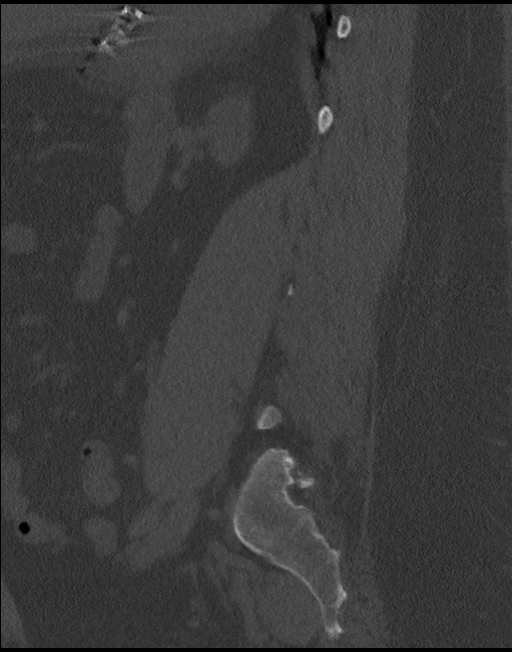
[im 59/118  bone]
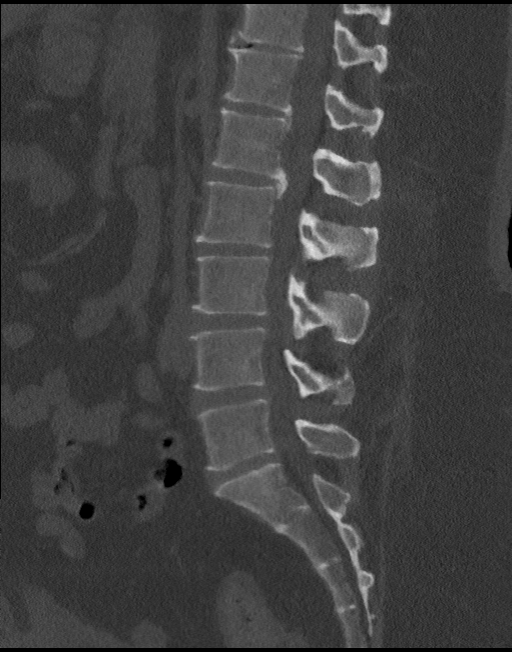
[im 79/118  bone]
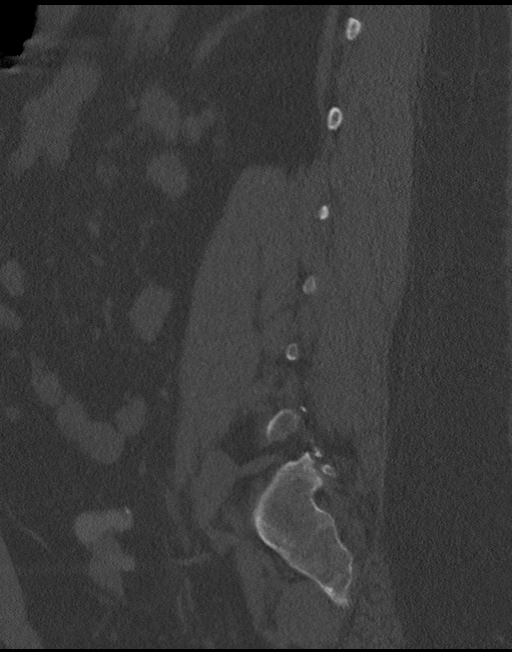
[im 98/118  bone]
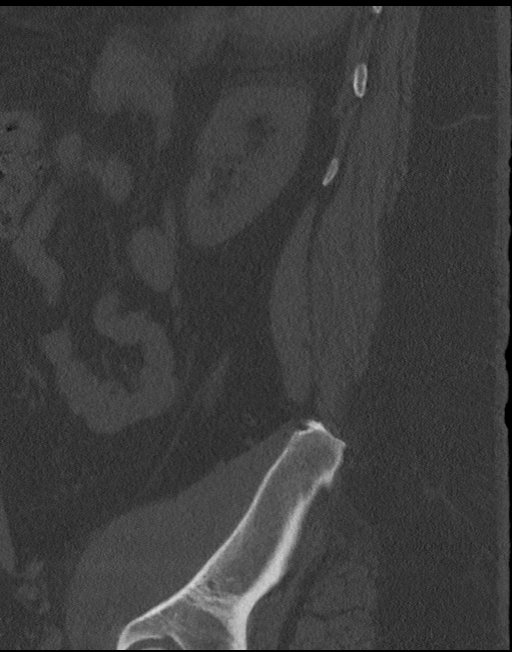

[Series 9: cor bone · coronal · 0.46mm/px · 1 of 127 slices shown]
[im 64/127  bone]
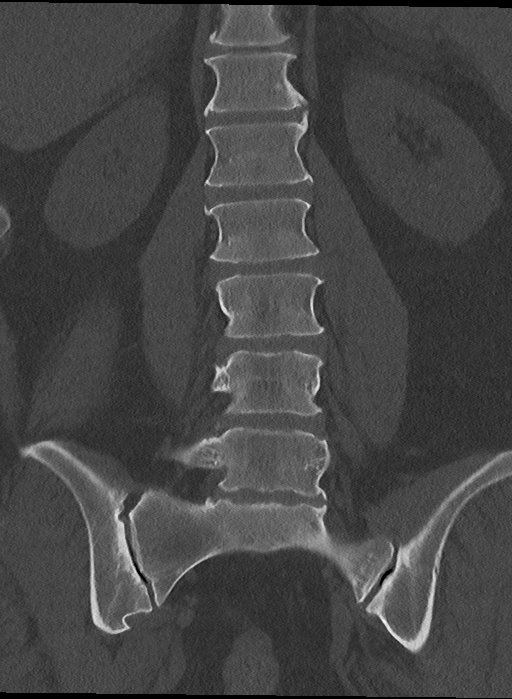

[15 of 33 positions shown; findings below may reference images not displayed]

FINDINGS: Segmentation: Standard. Lowest well-formed disc space labeled the
L5-S1 level.

Alignment: Mild levoscoliosis with straightening of the normal
lumbar lordosis. No listhesis.

Vertebrae: Vertebral body height maintained without acute or chronic
fracture. Visualized sacrum and pelvis intact. SI joints symmetric
and within normal limits. No discrete or worrisome osseous lesions.

Paraspinal and other soft tissues: Paraspinous soft tissues
demonstrate no acute finding. Mild aortic atherosclerosis. Prior
cholecystectomy. Sequelae of prior colectomy with residual mild
sigmoid diverticulosis. Foley catheter partially visualized.

Disc levels: T12-L1: Disc bulge with endplate spurring. Mild facet
hypertrophy. No stenosis.

L1-2: Mild disc bulge with endplate spurring. Mild facet
hypertrophy. No significant stenosis.

L2-3: Mild intervertebral disc space narrowing with diffuse disc
bulge. Superimposed left extraforaminal disc protrusion contacts the
exiting left L2 nerve root (series 7, image 58). Mild facet
hypertrophy. No significant spinal stenosis. Foramina remain patent.

L3-4: Mild disc bulge. Mild bilateral facet hypertrophy. No
significant spinal stenosis. Foramina remain patent.

L4-5: Mild disc bulge, slightly asymmetric to the left. Mild to
moderate facet and ligament flavum hypertrophy. No significant
spinal stenosis. Mild to moderate left L4 foraminal stenosis. Right
neural foramen remains patent.

L5-S1: Degenerative intervertebral disc space narrowing with diffuse
disc bulge and reactive endplate spurring. Superimposed right
subarticular disc protrusion contacts the descending right S1 nerve
root (series 7, image 112). Mild facet hypertrophy. No significant
spinal stenosis. Foramina remain patent.
IMPRESSION: 1. No acute abnormality within the lumbar spine.
2. Small left extraforaminal disc protrusion at L2-3, contacting and
potentially affecting the exiting left L2 nerve root.
3. Right subarticular disc protrusion at L5-S1, contacting the
descending right S1 nerve root.
4. Additional multilevel disc bulging and facet hypertrophy as
detailed above. No significant spinal stenosis.

Aortic Atherosclerosis (DWWI4-JWL.L).

## 2022-06-20 ENCOUNTER — Other Ambulatory Visit: Payer: Self-pay | Admitting: Internal Medicine

## 2022-06-22 ENCOUNTER — Other Ambulatory Visit: Payer: Self-pay

## 2022-06-22 MED ORDER — NOVOLOG FLEXPEN 100 UNIT/ML ~~LOC~~ SOPN: PEN_INJECTOR | SUBCUTANEOUS | 0 refills | Status: DC

## 2022-06-24 ENCOUNTER — Other Ambulatory Visit (HOSPITAL_COMMUNITY): Payer: Self-pay

## 2022-06-24 ENCOUNTER — Telehealth: Payer: Self-pay | Admitting: Pharmacy Technician

## 2022-06-24 NOTE — Telephone Encounter (Signed)
-----   Message from [place], [place] sent at 06/24/2022  4:08 PM EST ----- Regarding: PA Can we get a PA for Novolog. Thank you.

## 2022-06-24 NOTE — Telephone Encounter (Signed)
PA already on file until 07/25/22. CoverMyMeds will not allow [place] to request another PA at this time.

## 2022-06-30 ENCOUNTER — Telehealth: Payer: Self-pay

## 2022-06-30 NOTE — Telephone Encounter (Signed)
Inbound fax from [facility] requesting clinical notes. Clinical notes routed to [phone] via Epic.

## 2022-07-27 ENCOUNTER — Other Ambulatory Visit (HOSPITAL_COMMUNITY): Payer: Self-pay

## 2022-07-27 NOTE — Telephone Encounter (Signed)
Pharmacy Patient Advocate Encounter  Received notification from [employer] that prior authorization for Novolog is required/requested.  Per Test Claim: No PA needed.  $0 copay

## 2022-08-19 ENCOUNTER — Telehealth: Payer: Self-pay

## 2022-08-19 NOTE — Telephone Encounter (Signed)
Inbound call from DME supplier requesting form be completed and faxed with clinical notes. DME supplies ordered via [employer] through online portal.

## 2022-08-25 ENCOUNTER — Other Ambulatory Visit: Payer: Self-pay

## 2022-08-25 MED ORDER — LANTUS SOLOSTAR 100 UNIT/ML ~~LOC~~ SOPN: PEN_INJECTOR | SUBCUTANEOUS | 3 refills | Status: DC

## 2022-10-08 ENCOUNTER — Other Ambulatory Visit: Payer: Self-pay

## 2022-10-08 DIAGNOSIS — E1142 Type 2 diabetes mellitus with diabetic polyneuropathy: Secondary | ICD-10-CM

## 2022-10-08 MED ORDER — ACCU-CHEK GUIDE VI STRP: ORAL_STRIP | 3 refills | Status: DC

## 2022-10-08 MED ORDER — BAQSIMI ONE PACK 3 MG/DOSE NA POWD: 3.0000 mg | NASAL | 1 refills | Status: AC | PRN

## 2022-10-08 NOTE — Telephone Encounter (Signed)
Pt called requesting refill of Glucagon emergency kit.

## 2022-11-30 ENCOUNTER — Ambulatory Visit (INDEPENDENT_AMBULATORY_CARE_PROVIDER_SITE_OTHER): Payer: 59 | Admitting: Internal Medicine

## 2022-11-30 ENCOUNTER — Encounter: Payer: Self-pay | Admitting: Internal Medicine

## 2022-11-30 VITALS — BP 130/84 | HR 92 | Ht 68.0 in | Wt 268.2 lb

## 2022-11-30 DIAGNOSIS — Z794 Long term (current) use of insulin: Secondary | ICD-10-CM

## 2022-11-30 DIAGNOSIS — E119 Type 2 diabetes mellitus without complications: Secondary | ICD-10-CM

## 2022-11-30 DIAGNOSIS — E559 Vitamin D deficiency, unspecified: Secondary | ICD-10-CM | POA: Diagnosis not present

## 2022-11-30 DIAGNOSIS — E7849 Other hyperlipidemia: Secondary | ICD-10-CM | POA: Diagnosis not present

## 2022-11-30 DIAGNOSIS — E1142 Type 2 diabetes mellitus with diabetic polyneuropathy: Secondary | ICD-10-CM

## 2022-11-30 LAB — POCT GLYCOSYLATED HEMOGLOBIN (HGB A1C): Hemoglobin A1C: 6.4 % — AB (ref 4.0–5.6)

## 2022-11-30 MED ORDER — LYUMJEV KWIKPEN 200 UNIT/ML ~~LOC~~ SOPN: 10.0000 [IU] | PEN_INJECTOR | Freq: Three times a day (TID) | SUBCUTANEOUS | 3 refills | Status: DC

## 2022-11-30 MED ORDER — LANTUS SOLOSTAR 100 UNIT/ML ~~LOC~~ SOPN: PEN_INJECTOR | SUBCUTANEOUS | 3 refills | Status: DC

## 2022-11-30 NOTE — Patient Instructions (Addendum)
Please continue: - Lantus 50 units once a day  Change: - Lyumjev             150-175: 14 units,  175-200: 18 units, 200-230: 20 units, 230-and higher: 34 units   Please come back for a follow-up appointment in 6 months.

## 2022-11-30 NOTE — Progress Notes (Signed)
Patient ID: Gavin Smith, adult   DOB: January 27, 1976, 47 y.o.   MRN: [id number]  HPI: Gavin Smith is a 47 y.o.-year-old adult, initially referred by his PCP, Dr. Theador Hawthorne, presenting for follow-up for DM2, dx in 18 (47 y/o), insulin-dependent, uncontrolled, wit complications (PN). Last visit 6 months ago.  He is here with his girlfriend.  Interim history:   Before last visit, he started to walk with his girlfriend-walking 4 to 5 miles a day and completing all the circles on his watch.  He lost 22 pounds.  They also joined [employer] together.  His goal weight loss is 250 pounds, but ultimately 200 mg. He will start exercising at the [facility]. Since then, he lost 43 lbs! Now Gluten free. No increased urination, blurry vision, nausea, chest pain.  DM2: Reviewed history: He has a h/o autonomic neuropathy of the GI tract (dx in 1986, when he was 47 y/o - not related to DM - severe diarrhea: "food goes through me"), [place]. [place] >> permanent colostomy - had sx in 09/2014.   Reviewed HbA1c levels: Lab Results  Component Value Date   HGBA1C 7.1 (A) 06/01/2022   HGBA1C 7.2 (A) 05/18/2021   HGBA1C 5.7 (A) 05/16/2020  03/17/2022: HbA1c 6.7% 11/18/2020: HbA1c 6.9%02/20/2020: HbA1c 6.0%. 10/19/2019: HbA1c 6.7% 12/07/2018: HbA1c 6.8% 11/10/2016:The HbA1c calculated from the fructosamine was 7.3%, better than the measured HbA1c! 02/28/2015: HbA1c 6.9% 10/21/2014: HbA1c 6.9% - after changing from Humalog to NovoLog  07/23/2014: HbA1c 10.9% ICA Abs negative (01/31/2012) C-pp 5.0, Glu 218 (01/25/2012)  He had high HbA1c levels in the past as his insurance did not approve NovoLog and was forced to change to Humalog/Humulin.  Since then, we need to send a preauthorization for NovoLog to his insurance every year.  Pt. is on: - Lantus 20 units 2x a day >> 50 units 1x a day at night - NovoLog:  s   - target: 150  - ISF: 150-175: 14 units,  175-200: 18 units, 200-230: 20 units, 230-and higher: 34  units He cannot digest pills;  tried oral meds in the past >> "they went through me". He also tried Lantus >> this did not work for him >> sugars unchanged despite increasing the dose.  He tried [place] but sugars were higher.  He checks her sugars more than 4 times a day with the freestyle libre CGM:  Previously:   Lowest blood sugar: 42, 59 ...>> 60s  >> 60s. She has hypoglycemia awareness in the 60s. Highest sugars recently 400 (after breast sx)... >> 200s >> 300s.  -No CKD: 03/17/2022: 14/1, GFR 17, ACR 95, glucose 147 11/18/2021: 13/1.01, GFR 71, glucose 151, ACR 23 10/29/2020: Glucose 146, BUN/creatinine 16/1.06, GFR 67, ACR 7 02/20/2020:Glucose 127, BUN/creatinine 11/1.08, GFR 63 10/19/2019: Glucose 174, BUN/creatinine 10/1.10, GFR 63 06/16/2018: glucose 120, BUN/creatinine 14/0.84 Lab Results  Component Value Date   BUN 16 07/15/2017   Lab Results  Component Value Date   CREATININE 0.81 07/15/2017  02/03/2016: 13/0.93, Glu 183 07/02/2015: CMP: Normal, except glucose 216, BUN/creatinine 10/0.81, GFR 93, ALT 39 (0-32), bilirubin 1.3 (0-1.2) 07/23/2014: BUN/creatinine 12/0.9 Lab Results  Component Value Date   MICRALBCREAT 0.9 07/15/2017   MICRALBCREAT 0.5 08/12/2015  For now, off Lisinopril.  -+ HL; last set of lipids: 03/17/2022: 172/147/57/90 10/29/2021: 162/127/57/83 02/20/2020: 145/173/47/75 10/19/2019: 149/155/50/72 Lab Results  Component Value Date   CHOL 180 03/20/2019   HDL 48 03/20/2019   LDLCALC 97 03/20/2019   LDLDIRECT 119.0 07/15/2017   TRIG  173 (A) 03/20/2019   CHOLHDL 5 07/15/2017  02/03/2016: 205/284/55/93 07/02/2015: 206/229/63/97  07/23/2014: 324/868/42/171 AST/ALT: 121/76 He was taken off statins due to hemochromatosis >> now restarted Lipitor 10 mg daily. She had muscle aches on fenofibrate.  Currently on omega-3 fatty acids.  - last eye exam was in 2022: No DR reportedly [facility]).    Seeing podiatry and neurology. -+ he has Numbness and  tingling in his feet - and L hand numbness and pain.  He sees a podiatrist and neurology.  On Neurontin cream and Cymbalta - then restarted Cymbalta >> stopped again b/c ineffective.  Vitamin D def.:  Reviewed vitamin D levels: 11/26/2022: vitamin D 94.9 - he changed rgo to qod 03/17/2022: Vitamin D 51.6 11/18/2021 vitamin D 29.6 - ran out of ergocalciferol 10/29/2020: Vitamin D 55.9 02/20/2020: Vitamin D 47.9 Lab Results  Component Value Date   VD25OH 64.54 09/15/2018   VD25OH 31.25 07/15/2017   VD25OH 45.42 11/10/2016   VD25OH 35.30 05/31/2016   VD25OH 23.86 (L) 08/12/2015   VD25OH 19.32 (L) 11/28/2014  Vit D 23.5 in 02/03/2016 - on Ergocalciferol 50,000 IU daily. Vit D 13.9 in 06/2014 - despite being on Ergocalciferol 50,000 IU 2x a week. Vit D 19.3 in 11/2014 - repeat, on Ergocalciferol 50,000 IU 2x a week >> dose increased to 4 times a week Vit D 23.86 in 07/2015 - on ergocalciferol 50,000 IU 4x a week >> dose increased to daily   He continues on ergocalciferol 50,000 units daily.  Latest TSH was normal: 10/29/2020: TSH 1.83 Lab Results  Component Value Date   TSH 0.762 07/23/2016   He continues on testosterone for male to male transition.  He felt much better after starting this.  Had B mastectomy in 06/2019.  This was an extensive surgery.  He also had hysterectomy in 02/2021.  He started to take testosterone sq instead of im >> liver tests and testosterone levels improved. Latest testosterone 575 (at goal) 03/17/2022. He also has hemochromatosis, allergy-triggered asthma, HTN, OSA-on CPAP, transaminitis, GERD, depression.  He  is on Depo-Provera. Before our visit at the end of 2022, he eliminated starches (bread, rice, pasta) and diet sodas. He lost 31 lbs overall per scale at home. He has rosacea >> was on Bactrim for many years >> not helpful anymore >> on antibiotics per dermatology.   He usually comes to the appointments with her service dog, Konrad Dolores,  who played in [place]  musical and also in [place]. 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  ROS: + See HPI  I reviewed pt's medications, allergies, PMH, social hx, family hx, and changes were documented in the history of present illness. Otherwise, unchanged from my initial visit note.  Past Medical History:  Diagnosis Date   Allergic rhinitis    Asperger's syndrome 06/26/2019   Asthma exacerbation 07/22/2016   Carpal tunnel syndrome, bilateral 06/27/2020   Colostomy in place [facility])    Diabetic gastropathy (HCC)    Diabetic neuropathy, type II diabetes mellitus (HCC)    Diabetic peripheral neuropathy (HCC) 07/15/2015   Encounter for postoperative care 03/12/2021   Essential hypertension, benign    Extrinsic asthma, unspecified    Family history of breast cancer gene mutation in first degree relative 05/29/2019   Food intolerance    Gender dysphoria 12/19/2018   Formatting of this note might be different from the original. Trans Male Assessment and Plan  Pronoun Use: he/him Preferred Name: JP Preferred Anatomical  Terminology: scientific Legal Documentation Corrected: None  Hormone Start Date: Dec 13, 2018 Current Testosterone Dose: 50 mg cypionate Mode of Delivery (SQ vs IM): IM Injection Schedule (Weekly?  Twice a week?): weekly  Recommended Monitoring S   Generalized osteoarthrosis, involving multiple sites    History of colostomy    Hormone replacement therapy    HTN (hypertension), benign    Hyperlipidemia    IDDM (insulin dependent diabetes mellitus) 06/16/2018   Inappropriate sinus tachycardia    Incisional hernia with obstruction but no gangrene 07/22/2017   Insomnia, unspecified    Lumbar spinal stenosis 07/15/2015   Morbid obesity with BMI of 45.0-49.9, adult (HCC)    Muscular dystrophy (HCC) 07/09/2021   Formatting of this note might be different from the original. diagnosed age 52 yrs   Neuropathy 02/27/2015   Other hyperlipidemia    Pityriasis rosea    Pure  hypercholesterolemia    Pure hypercholesterolemia    Sleep apnea    Type 2 diabetes, controlled, with peripheral neuropathy (HCC)    Ulnar neuritis, left    Urinary retention with incomplete bladder emptying    Vitamin D deficiency    Past Surgical History:  Procedure Laterality Date   BREAST REDUCTION SURGERY  2005   CHOLECYSTECTOMY  2000   IR CATHETER TUBE CHANGE  08/31/2021   RHINOPLASTY  2005   SPINAL FUSION  2006   L5-S1 spinal fusion   History   Social History   Marital Status: Single    Spouse Name: N/A   Number of Children: 0   Years of Education: 14   Occupational History   Prev. EMS, now disabled   Social History Main Topics   Smoking status: Never Smoker    Smokeless tobacco: Not on file   Alcohol Use: No   Drug Use: No   Current Outpatient Medications on File Prior to Visit  Medication Sig Dispense Refill   Accu-Chek FastClix Lancets MISC USE TO CHECK BLOOD SUGAR 4 TIMES A DAY 300 each 3   albuterol (PROVENTIL) (2.5 MG/3ML) 0.083% nebulizer solution Take 2.5 mg by nebulization every 6 (six) hours as needed for wheezing.      albuterol (VENTOLIN HFA) 108 (90 Base) MCG/ACT inhaler Inhale 1 puff into the lungs every 4 (four) hours as needed for wheezing.      atorvastatin (LIPITOR) 20 MG tablet Take 20 mg by mouth daily.     cetirizine (ZYRTEC) 10 MG tablet Take 10 mg by mouth daily.     Cholecalciferol (VITAMIN D-3) 125 MCG (5000 UT) TABS Take 50,000 Units by mouth daily. 30000 tablet 0   Continuous Blood Gluc Sensor (FREESTYLE LIBRE 3 SENSOR) MISC 1 each by Does not apply route every 14 (fourteen) days. 6 each 3   cyclobenzaprine (FLEXERIL) 10 MG tablet Take 1 tablet (10 mg total) by mouth 2 (two) times daily as needed for muscle spasms. 10 tablet 0   fluticasone (FLONASE) 50 MCG/ACT nasal spray Place 2 sprays into both nostrils 2 (two) times daily as needed for allergies.     Glucagon (BAQSIMI ONE PACK) 3 MG/DOSE POWD Place 3 mg into the nose as needed. 1 each 1    glucose blood (ACCU-CHEK GUIDE) test strip Use as instructed 4x a day 300 each 3   insulin glargine (LANTUS SOLOSTAR) 100 UNIT/ML Solostar Pen ADMINISTER 24 TO 26 UNITS UNDER THE SKIN TWICE DAILY 30 mL 3   Insulin Pen Needle (B-D ULTRAFINE III SHORT PEN) 31G X 8 MM MISC USE  AS DIRECTED(8 TIMES DAILY) 500 each 3   LINZESS 145 MCG CAPS capsule Take 145 mcg by mouth daily as needed (constipation).     lisinopril (PRINIVIL,ZESTRIL) 10 MG tablet Take 10 mg by mouth daily.       NOVOLOG FLEXPEN 100 UNIT/ML FlexPen INJECT UPTO 50 UNITS UNDER THE SKIN FOUR TIMES DAILY PER SLIDING SCALE(MAX 200 UNITS DAILY) 180 mL 0   omega-3 acid ethyl esters (LOVAZA) 1 g capsule Take 2 capsules by mouth 2 (two) times daily.     oxybutynin (DITROPAN) 5 MG tablet Take 5 mg by mouth 3 (three) times daily as needed for bladder spasms. Overactive bladder     oxyCODONE-acetaminophen (PERCOCET/ROXICET) 5-325 MG tablet Take 1 tablet by mouth every 8 (eight) hours as needed for severe pain. 6 tablet 0   PULMICORT 0.5 MG/2ML nebulizer solution Take 2 mLs by nebulization daily as needed for wheezing.     rizatriptan (MAXALT-MLT) 10 MG disintegrating tablet Take 10 mg by mouth as needed for headache.     sulfamethoxazole-trimethoprim (BACTRIM DS) 800-160 MG tablet Take 1 tablet by mouth daily.     testosterone cypionate (DEPOTESTOSTERONE CYPIONATE) 200 MG/ML injection Inject 50 mg into the muscle once a week. 0.25 mL     tiZANidine (ZANAFLEX) 2 MG tablet Take 2 mg by mouth every 8 (eight) hours as needed for muscle spasms.      traMADol (ULTRAM) 50 MG tablet Take 50-100 mg by mouth every 8 (eight) hours as needed for severe pain. 100 mg every night at bedtime     Vitamin D, Ergocalciferol, (DRISDOL) 1.25 MG (50000 UNIT) CAPS capsule Take 1 capsule (50,000 Units total) by mouth daily. 90 capsule 3   zolpidem (AMBIEN) 10 MG tablet Take 10 mg by mouth at bedtime as needed for sleep.     No current facility-administered medications on  file prior to visit.   Allergies  Allergen Reactions   Fenofibrate Other (See Comments)    Severe leg pain   Humalog [Insulin Lispro] Other (See Comments)    Drug tolerance >> ineffectiveness! Pt needs NovoLog instead!   Penicillins     Family History    Morphine And Related Rash   Family History  Problem Relation Age of Onset   Diabetes Mother    Hypertension Mother    Hyperlipidemia Mother    Hypertension Father    Hyperlipidemia Father    Diabetes Maternal Grandmother    Cancer Maternal Grandmother        Breast Cancer   Hypertension Maternal Grandfather    Heart disease Maternal Grandfather    Heart disease Paternal Grandfather    Cancer Other        Ovarian Cancer-Grandparent   PE: BP 130/84 (BP Location: Left Arm, Patient Position: Sitting, Cuff Size: Normal)   Pulse 92   Ht 5\' 8"  (1.727 m)   Wt 268 lb 3.2 oz (121.7 kg)   SpO2 99%   BMI 40.78 kg/m   Wt Readings from Last 3 Encounters:  11/30/22 268 lb 3.2 oz (121.7 kg)  06/01/22 (!) 311 lb 12.8 oz (141.4 kg)  12/01/21 (!) 333 lb (151 kg)   Constitutional: overweight, in NAD Eyes:  EOMI, no exophthalmos ENT: no neck masses, no cervical lymphadenopathy Cardiovascular: RRR, No MRG, + B arm swelling after lymphectomy after  mastectomy. Respiratory: CTA B Musculoskeletal: no deformities Skin:no rashes Neurological: no tremor with outstretched hands  ASSESSMENT: 1. DM2, insulin-dependent, uncontrolled, with complications - PN  - Patient had a  lot of problems controlling his diabetes over the years due to unpredictability of retaining the food he ate in the setting of autonomic GI neuropathy.    Humalog did not work for him >> sugars >200 constantly. Since he cannot use Humalog, we sent and then re-sent a PA for NovoLog. NovoLog was finally approved, but this form has to be sent every year!  2. Vit D def  PLAN:  1. Patient with improved control in the last several years, and the sugars improving further  after starting a CGM.  His nadir HbA1c was 5.7%.  At last visit, however, HbA1c was higher, at 7.1%.  He was exercising more and already started to lose weight.  Adjust his insulin to carb ratios to avoid hypoglycemia.  I recommended to calculate 50% of the protein amount as carbs as he was eating a low-carb diet.  We also backed off his Lantus dose. CGM interpretation: -At today's visit, we reviewed his CGM downloads: It appears that 80% of values are in target range (goal >70%), while 20% are higher than 180 (goal <25%), and 0% are lower than 70 (goal <4%).  The calculated average blood sugar is 149.  The projected HbA1c for the next 3 months (GMI) is 6.9%. -Reviewing the CGM trends, sugars appear to be fluctuating within the target range with only occasional higher blood sugars, but overall, with excellent control.  They appear to have improved since last visit despite the fact that he changed his insulin regimen quite a bit: He now takes Lantus only once a day, at bedtime, and this works very well for him.  Also, he is not taking insulin before meals if the sugars are lower than 150 and he eats a low-carb meal.  He starts taking insulin depending on the sugars before meals and may add 10 units for higher carb meal.  This is an unusual regimen but it does work for him so I did not suggest to change this for now.  If he continues to lose weight he may need to adjust the regimen further.  He is doing a great job with this! -He needs a new prescription for NovoLog.  This usually requires a preauthorization and an appeal, and its overall a complicated process.  At today's visit, he tells me that he is interested in retrying Humalog, now that he is requiring less insulin compared to before.  I suggested Lyumjev U200.  I sent a prescription for this to his pharmacy.  We also discussed briefly about the CeQur simplicity insulin pump.  He will check with his insurance to see if this is covered for him - I suggested  to:  Patient Instructions  Please continue: - Lantus 50 units once a day  Change: - Lyumjev             150-175: 14 units,  175-200: 18 units, 200-230: 20 units, 230-and higher: 34 units   Please come back for a follow-up appointment in 6 months.      - we checked his HbA1c: 6.4% (lower) - advised to check sugars at different times of the day - 4x a day, rotating check times - advised for yearly eye exams >> he is UTD - return to clinic in 3-4 months  2. HL -Reviewed latest lipid panel from 02/2022: At goal. -He tried fenofibrate in the past but he had side effects.  Now on omega-3 fatty acids.    3. Vit D def -Was previously on a high dose  of vitamin D: 50,000 units daily -Latest vitamin D level was normal in 09/2021 and reportedly again in 11/2022.  However, the latest level was high in the normal range, in the 90s so he decreased to 50,000 units of vitamin D every other day.  We can continue this for now and we could hopefully start decreasing the dose even further as he continues to lose weight and spend more time outside.  He had labs at the beginning of the month and he will send these to me.  Carlus Pavlov, MD PhD [facility] Endocrinology

## 2023-02-03 ENCOUNTER — Other Ambulatory Visit: Payer: Self-pay | Admitting: Internal Medicine

## 2023-02-17 ENCOUNTER — Other Ambulatory Visit: Payer: Self-pay | Admitting: Internal Medicine

## 2023-06-01 ENCOUNTER — Ambulatory Visit: Payer: 59 | Admitting: Internal Medicine

## 2023-06-02 ENCOUNTER — Encounter: Payer: Self-pay | Admitting: Internal Medicine

## 2023-06-02 ENCOUNTER — Ambulatory Visit: Payer: 59 | Admitting: Internal Medicine

## 2023-06-02 VITALS — BP 130/86 | HR 58 | Resp 20 | Ht 68.0 in | Wt 271.2 lb

## 2023-06-02 DIAGNOSIS — E559 Vitamin D deficiency, unspecified: Secondary | ICD-10-CM

## 2023-06-02 DIAGNOSIS — E7849 Other hyperlipidemia: Secondary | ICD-10-CM

## 2023-06-02 DIAGNOSIS — E1142 Type 2 diabetes mellitus with diabetic polyneuropathy: Secondary | ICD-10-CM

## 2023-06-02 DIAGNOSIS — Z794 Long term (current) use of insulin: Secondary | ICD-10-CM | POA: Diagnosis not present

## 2023-06-02 LAB — POCT GLYCOSYLATED HEMOGLOBIN (HGB A1C): Hemoglobin A1C: 7.4 % — AB (ref 4.0–5.6)

## 2023-06-02 MED ORDER — CEQUR SIMPLICITY INSERTER MISC: 0 refills | Status: DC

## 2023-06-02 MED ORDER — CEQUR SIMPLICITY 2U DEVI: 1.0000 | 11 refills | Status: DC

## 2023-06-02 MED ORDER — VITAMIN D (ERGOCALCIFEROL) 1.25 MG (50000 UNIT) PO CAPS: 50000.0000 [IU] | ORAL_CAPSULE | ORAL | 3 refills | Status: DC

## 2023-06-02 MED ORDER — INSULIN LISPRO 100 UNIT/ML IJ SOLN: 70.0000 [IU] | Freq: Every day | INTRAMUSCULAR | 11 refills | Status: AC

## 2023-06-02 NOTE — Patient Instructions (Addendum)
Please continue: - Lantus 50 units once a day  Change from Lyumjev to Humalog.  Start the CeQur pump.  Please schedule an appt with Cristy Folks for diabetes education. [phone].  Use the following doses of Humalog:             150-175: 14 units (7 clicks),  175-200: 18 units (9 clicks), 200-230: 20 units (10 clicks), 230-and higher: 34 units (17 clicks)   Please come back for a follow-up appointment in 4-6 months.

## 2023-06-02 NOTE — Progress Notes (Signed)
Patient ID: Gavin Smith, adult   DOB: 03-Apr-1976, 47 y.o.   MRN: [id number]  HPI: Gavin Smith is a 47 y.o.-year-old adult, initially referred by his PCP, Dr. Theador Hawthorne, presenting for follow-up for DM2, dx in 34 (47 y/o), insulin-dependent, uncontrolled, wit complications (PN). Last visit 6 months ago.  He is here with his girlfriend.  Interim history:   Before last visit, he started to walk with his girlfriend-walking 4 to 5 miles a day and completing all the circles on his watch. They also joined [employer] together.   Before last visit he lost 43 pounds, previously 22.  He gained 3 pounds since then.  He continues on a diet. No increased urination, blurry vision, nausea, chest pain.  DM2: Reviewed history: He has a h/o autonomic neuropathy of the GI tract (dx in 1986, when he was 47 y/o - not related to DM - severe diarrhea: "food goes through me"), [place] Dr. [place] >> permanent colostomy - had sx in 09/2014.   Reviewed HbA1c levels: Lab Results  Component Value Date   HGBA1C 6.4 (A) 11/30/2022   HGBA1C 7.1 (A) 06/01/2022   HGBA1C 7.2 (A) 05/18/2021  03/17/2022: HbA1c 6.7% 11/18/2020: HbA1c 6.9%02/20/2020: HbA1c 6.0%. 10/19/2019: HbA1c 6.7% 12/07/2018: HbA1c 6.8% 11/10/2016:The HbA1c calculated from the fructosamine was 7.3%, better than the measured HbA1c! 02/28/2015: HbA1c 6.9% 10/21/2014: HbA1c 6.9% - after changing from Humalog to NovoLog  07/23/2014: HbA1c 10.9% ICA Abs negative (01/31/2012) C-pp 5.0, Glu 218 (01/25/2012)  He had high HbA1c levels in the past as his insurance did not approve NovoLog and was forced to change to Humalog/Humulin.  Since then, we need to send a preauthorization for NovoLog to his insurance every year.  Pt. is on: - Lantus 20 units 2x a day >> 50 units 1x a day at night - NovoLog >> Lyumjev  s   - target: 150  - ISF: 150-175: 14 units,  175-200: 18 units, 200-230: 20 units, 230-and higher: 34 units He cannot digest pills;  tried oral meds in  the past >> "they went through me". He also tried Lantus >> this did not work for him >> sugars unchanged despite increasing the dose.  He tried [place] but sugars were higher.  He checks her sugars more than 4 times a day with the freestyle libre CGM:  Previously:  Previously:   Lowest blood sugar: 42, 59 ...>> 60s  >> 60s. She has hypoglycemia awareness in the 60s. Highest sugars recently 400 (after breast sx)... >> 200s >> 300s.  -No CKD: 03/17/2022: 14/1, GFR 17, ACR 95, glucose 147 11/18/2021: 13/1.01, GFR 71, glucose 151, ACR 23 10/29/2020: Glucose 146, BUN/creatinine 16/1.06, GFR 67, ACR 7 02/20/2020:Glucose 127, BUN/creatinine 11/1.08, GFR 63 10/19/2019: Glucose 174, BUN/creatinine 10/1.10, GFR 63 06/16/2018: glucose 120, BUN/creatinine 14/0.84 Lab Results  Component Value Date   BUN 16 07/15/2017   Lab Results  Component Value Date   CREATININE 0.81 07/15/2017  02/03/2016: 13/0.93, Glu 183 07/02/2015: CMP: Normal, except glucose 216, BUN/creatinine 10/0.81, GFR 93, ALT 39 (0-32), bilirubin 1.3 (0-1.2) 07/23/2014: BUN/creatinine 12/0.9 Lab Results  Component Value Date   MICRALBCREAT 0.9 07/15/2017   MICRALBCREAT 0.5 08/12/2015  For now, off Lisinopril.  -+ HL; last set of lipids: 03/17/2022: 172/147/57/90 10/29/2021: 162/127/57/83 02/20/2020: 145/173/47/75 10/19/2019: 149/155/50/72 Lab Results  Component Value Date   CHOL 180 03/20/2019   HDL 48 03/20/2019   LDLCALC 97 03/20/2019   LDLDIRECT 119.0 07/15/2017   TRIG 173 (A) 03/20/2019   CHOLHDL  5 07/15/2017  02/03/2016: 205/284/55/93 07/02/2015: 206/229/63/97  07/23/2014: 324/868/42/171 AST/ALT: 121/76 He was taken off statins due to hemochromatosis >> now restarted Lipitor 10 mg daily. She had muscle aches on fenofibrate.  Currently on omega-3 fatty acids.  - last eye exam was in 2024: No DR reportedly [facility]).    Seeing podiatry and neurology. -+ he has Numbness and tingling in his feet - and L hand  numbness and pain.  He sees a podiatrist and neurology.  On Neurontin cream and Cymbalta - then restarted Cymbalta >> stopped again b/c ineffective. On Tramadol for nerve pain. He saw pain clinic but cannot return until stops Lunesta. .  Vitamin D def.:  Reviewed vitamin D levels: 11/26/2022: vitamin D 94.9 - he changed ergocalciferol to qod 03/17/2022: Vitamin D 51.6 11/18/2021 vitamin D 29.6 - ran out of ergocalciferol 10/29/2020: Vitamin D 55.9 02/20/2020: Vitamin D 47.9 Lab Results  Component Value Date   VD25OH 64.54 09/15/2018   VD25OH 31.25 07/15/2017   VD25OH 45.42 11/10/2016   VD25OH 35.30 05/31/2016   VD25OH 23.86 (L) 08/12/2015   VD25OH 19.32 (L) 11/28/2014  Vit D 23.5 in 02/03/2016 - on Ergocalciferol 50,000 IU daily. Vit D 13.9 in 06/2014 - despite being on Ergocalciferol 50,000 IU 2x a week. Vit D 19.3 in 11/2014 - repeat, on Ergocalciferol 50,000 IU 2x a week >> dose increased to 4 times a week Vit D 23.86 in 07/2015 - on ergocalciferol 50,000 IU 4x a week >> dose increased to daily   He continues on ergocalciferol 50,000 units, now every other day, previously daily  Latest TSH was normal: 10/29/2020: TSH 1.83 Lab Results  Component Value Date   TSH 0.762 07/23/2016   He continues on testosterone for male to male transition.  He felt much better after starting this.  Had B mastectomy in 06/2019.  This was an extensive surgery.  He also had hysterectomy in 02/2021.  He started to take testosterone sq instead of im >> liver tests and testosterone levels improved.  He also has hemochromatosis, allergy-triggered asthma, HTN, OSA-on CPAP, transaminitis, GERD, depression.  He  is on Depo-Provera. Before our visit at the end of 2022, he eliminated starches (bread, rice, pasta) and diet sodas. He lost 31 lbs overall per scale at home. He has rosacea >> was on Bactrim for many years >> not helpful anymore >> on antibiotics per dermatology.  ROS: + See HPI  I reviewed pt's  medications, allergies, PMH, social hx, family hx, and changes were documented in the history of present illness. Otherwise, unchanged from my initial visit note.  Past Medical History:  Diagnosis Date   Allergic rhinitis    Asperger's syndrome 06/26/2019   Asthma exacerbation 07/22/2016   Carpal tunnel syndrome, bilateral 06/27/2020   Colostomy in place [facility])    Diabetic gastropathy (HCC)    Diabetic neuropathy, type II diabetes mellitus (HCC)    Diabetic peripheral neuropathy (HCC) 07/15/2015   Encounter for postoperative care 03/12/2021   Essential hypertension, benign    Extrinsic asthma, unspecified    Family history of breast cancer gene mutation in first degree relative 05/29/2019   Food intolerance    Gender dysphoria 12/19/2018   Formatting of this note might be different from the original. Trans Male Assessment and Plan  Pronoun Use: he/him Preferred Name: JP Preferred Anatomical Terminology: scientific Legal Documentation Corrected: None  Hormone Start Date: Dec 13, 2018 Current Testosterone Dose: 50 mg cypionate Mode of Delivery (SQ vs IM): IM  Injection Schedule (Weekly?  Twice a week?): weekly  Recommended Monitoring S   Generalized osteoarthrosis, involving multiple sites    History of colostomy    Hormone replacement therapy    HTN (hypertension), benign    Hyperlipidemia    IDDM (insulin dependent diabetes mellitus) 06/16/2018   Inappropriate sinus tachycardia    Incisional hernia with obstruction but no gangrene 07/22/2017   Insomnia, unspecified    Lumbar spinal stenosis 07/15/2015   Morbid obesity with BMI of 45.0-49.9, adult (HCC)    Muscular dystrophy (HCC) 07/09/2021   Formatting of this note might be different from the original. diagnosed age 69 yrs   Neuropathy 02/27/2015   Other hyperlipidemia    Pityriasis rosea    Pure hypercholesterolemia    Pure hypercholesterolemia    Sleep apnea    Type 2 diabetes, controlled, with peripheral neuropathy (HCC)    Ulnar  neuritis, left    Urinary retention with incomplete bladder emptying    Vitamin D deficiency    Past Surgical History:  Procedure Laterality Date   BREAST REDUCTION SURGERY  2005   CHOLECYSTECTOMY  2000   IR CATHETER TUBE CHANGE  08/31/2021   RHINOPLASTY  2005   SPINAL FUSION  2006   L5-S1 spinal fusion   History   Social History   Marital Status: Single    Spouse Name: N/A   Number of Children: 0   Years of Education: 14   Occupational History   Prev. EMS, now disabled   Social History Main Topics   Smoking status: Never Smoker    Smokeless tobacco: Not on file   Alcohol Use: No   Drug Use: No   Current Outpatient Medications on File Prior to Visit  Medication Sig Dispense Refill   Accu-Chek FastClix Lancets MISC USE TO CHECK BLOOD SUGAR 4 TIMES A DAY 300 each 3   albuterol (PROVENTIL) (2.5 MG/3ML) 0.083% nebulizer solution Take 2.5 mg by nebulization every 6 (six) hours as needed for wheezing.      albuterol (VENTOLIN HFA) 108 (90 Base) MCG/ACT inhaler Inhale 1 puff into the lungs every 4 (four) hours as needed for wheezing.      atorvastatin (LIPITOR) 20 MG tablet Take 20 mg by mouth daily.     Cholecalciferol (VITAMIN D-3) 125 MCG (5000 UT) TABS Take 50,000 Units by mouth daily. 30000 tablet 0   Continuous Blood Gluc Sensor (FREESTYLE LIBRE 3 SENSOR) MISC 1 each by Does not apply route every 14 (fourteen) days. 6 each 3   fluticasone (FLONASE) 50 MCG/ACT nasal spray Place 2 sprays into both nostrils 2 (two) times daily as needed for allergies.     Glucagon (BAQSIMI ONE PACK) 3 MG/DOSE POWD Place 3 mg into the nose as needed. 1 each 1   glucose blood (ACCU-CHEK GUIDE) test strip Use as instructed 4x a day 300 each 3   insulin glargine (LANTUS SOLOSTAR) 100 UNIT/ML Solostar Pen ADMINISTER 24 TO 26 UNITS UNDER THE SKIN TWICE DAILY 30 mL 3   Insulin Lispro-aabc (LYUMJEV KWIKPEN) 200 UNIT/ML KwikPen Inject 10-40 Units into the skin 3 (three) times daily before meals. 60 mL 3    Insulin Pen Needle (B-D ULTRAFINE III SHORT PEN) 31G X 8 MM MISC USE 8 TIMES DAILY AS DIRECTED 500 each 3   LINZESS 145 MCG CAPS capsule Take 145 mcg by mouth daily as needed (constipation).     lisinopril (PRINIVIL,ZESTRIL) 10 MG tablet Take 10 mg by mouth daily.  NOVOLOG FLEXPEN 100 UNIT/ML FlexPen INJECT UPTO 50 UNITS UNDER THE SKIN FOUR TIMES DAILY PER SLIDING SCALE(MAX 200 UNITS DAILY) 180 mL 0   omega-3 acid ethyl esters (LOVAZA) 1 g capsule Take 2 capsules by mouth 2 (two) times daily.     PULMICORT 0.5 MG/2ML nebulizer solution Take 2 mLs by nebulization daily as needed for wheezing.     rizatriptan (MAXALT-MLT) 10 MG disintegrating tablet Take 10 mg by mouth as needed for headache.     testosterone cypionate (DEPOTESTOSTERONE CYPIONATE) 200 MG/ML injection Inject 50 mg into the muscle once a week. 0.25 mL     tiZANidine (ZANAFLEX) 2 MG tablet Take 2 mg by mouth every 8 (eight) hours as needed for muscle spasms.      traMADol (ULTRAM) 50 MG tablet Take 50-100 mg by mouth every 8 (eight) hours as needed for severe pain. 100 mg every night at bedtime     Vitamin D, Ergocalciferol, (DRISDOL) 1.25 MG (50000 UNIT) CAPS capsule Take 1 capsule (50,000 Units total) by mouth daily. 90 capsule 3   No current facility-administered medications on file prior to visit.   Allergies  Allergen Reactions   Fenofibrate Other (See Comments)    Severe leg pain   Humalog [Insulin Lispro] Other (See Comments)    Drug tolerance >> ineffectiveness! Pt needs NovoLog instead!   Penicillins     Family History    Morphine And Codeine Rash   Family History  Problem Relation Age of Onset   Diabetes Mother    Hypertension Mother    Hyperlipidemia Mother    Hypertension Father    Hyperlipidemia Father    Diabetes Maternal Grandmother    Cancer Maternal Grandmother        Breast Cancer   Hypertension Maternal Grandfather    Heart disease Maternal Grandfather    Heart disease Paternal Grandfather     Cancer Other        Ovarian Cancer-Grandparent   PE: BP 130/86 (BP Location: Left Arm, Patient Position: Sitting, Cuff Size: Large)   Pulse (!) 58   Resp 20   Ht 5\' 8"  (1.727 m)   Wt 271 lb 3.2 oz (123 kg)   SpO2 95%   BMI 41.24 kg/m   Wt Readings from Last 3 Encounters:  06/02/23 271 lb 3.2 oz (123 kg)  11/30/22 268 lb 3.2 oz (121.7 kg)  06/01/22 (!) 311 lb 12.8 oz (141.4 kg)   Constitutional: overweight, in NAD Eyes:  EOMI, no exophthalmos ENT: no neck masses, no cervical lymphadenopathy Cardiovascular: RRR, No MRG, + B arm swelling after lymphectomy after  mastectomy. Respiratory: CTA B Musculoskeletal: no deformities Skin:no rashes Neurological: no tremor with outstretched hands Diabetic Foot Exam - Simple   Simple Foot Form Diabetic Foot exam was performed with the following findings: Yes 06/02/2023 10:44 AM  Visual Inspection No deformities, no ulcerations, no other skin breakdown bilaterally: Yes Sensation Testing See comments: Yes Pulse Check Posterior Tibialis and Dorsalis pulse intact bilaterally: Yes Comments No sensation to monofilament    ASSESSMENT: 1. DM2, insulin-dependent, uncontrolled, with complications - PN  - Patient had a lot of problems controlling his diabetes over the years due to unpredictability of retaining the food he ate in the setting of autonomic GI neuropathy.    Humalog did not work for him >> sugars >200 constantly. Since he cannot use Humalog, we sent and then re-sent a PA for NovoLog. NovoLog was finally approved, but this form has to be sent every year!  2. Vit D def  PLAN:  1. Patient with improved control in the last several years, with the sugars improving further after starting the CGM (nadir HbA1c 5.7%) and especially after improving his diet.  Latest HbA1c was 6.4%, decreased from 7.1%.  We previously backed off the Lantus dose and at last visit, he was requiring less mealtime insulin. -At last visit, sugars were fluctuating  within the target range, with occasional higher blood sugars but overall excellent control.  He was taking Lantus only once a day, at bedtime and he was not taking insulin before meals if sugars were lower than 150 and he was eating a low-carb meal.  He was adding 10 units of insulin for higher carb meal.  This was an unusual regimen but it was working for him.  At last visit he was running out of NovoLog and he was agreeable to retry Humalog.  However, I suggested Lyumjev U200.  We also discussed briefly about the CeQur simplicity insulin pump and he wanted to check with his insurance to see if this was covered. CGM interpretation: -At today's visit, we reviewed his CGM downloads: It appears that 37% of values are in target range (goal >70%), while 63% are higher than 180 (goal <25%), and 0% are lower than 70 (goal <4%).  The calculated average blood sugar is 210.  The projected HbA1c for the next 3 months (GMI) is 8.3%. -Reviewing the CGM trends, sugars appear to be increasing when he starts eating, particularly after lunch and significantly after dinner, with a most all blood sugars before bedtime being high, even up to 300s or higher.  Afterwards, sugars improve overnight to the upper half of the target range.  Upon questioning, he feels that Lyumjev is not working well for him.  He would like to switch to Humalog and try this again.  If not working, he will need to go back to NovoLog, and fill out the preauthorization.  He is also interested in starting on an insulin pump.  We discussed about different pumps on the market and I again recommended the CeQur simplicity.  We discussed about capacity, time until she has to change it, and how to do the boluses.  I sent a prescription to the pharmacy and referred him to the diabetes educator. - I suggested to:  Patient Instructions  Please continue: - Lantus 50 units once a day  Change from Lyumjev to Humalog.  Start the CeQur pump.  Please schedule an  appt with Cristy Folks for diabetes education. [phone].  Use the following doses of Humalog:             150-175: 14 units (7 clicks),  175-200: 18 units (9 clicks), 200-230: 20 units (10 clicks), 230-and higher: 34 units (17 clicks)   Please come back for a follow-up appointment in 4-6 months.     - we checked his HbA1c: 7.4% (higher) - advised to check sugars at different times of the day - 4x a day, rotating check times - advised for yearly eye exams >> he is UTD - he recently had labs and they were at goal with the exception of a low testosterone, after which his testosterone supplement was increased.  He agrees with the results. - return to clinic in 6 months  2. HL - Reviewed latest lipid panel from 02/2022: At goal -He tried fenofibrate in the past but he had side effects.  Now on lipitor 10 mg daily, omega-3 fatty acids and continuing to  work on his diet - he will send me the results of his new last lipid panel  3. Vit D def -He was previously on a high dose of vitamin D 5000 units daily, but this was switched to every other day after vitamin D level returned close to the upper limit of the normal range on 11/26/2022 and reportedly also few weeks ago. -Will continue the above dose for now -I refilled his ergocalciferol prescription.  Carlus Pavlov, MD PhD [facility] Endocrinology

## 2023-06-16 ENCOUNTER — Encounter: Payer: Self-pay | Admitting: Internal Medicine

## 2023-06-16 ENCOUNTER — Other Ambulatory Visit (HOSPITAL_COMMUNITY): Payer: Self-pay

## 2023-06-17 MED ORDER — FREESTYLE LIBRE 2 PLUS SENSOR MISC: 1.0000 | 3 refills | Status: DC

## 2023-07-18 ENCOUNTER — Other Ambulatory Visit: Payer: Self-pay | Admitting: Rehabilitation

## 2023-07-18 ENCOUNTER — Encounter: Payer: Self-pay | Admitting: Rehabilitation

## 2023-07-18 DIAGNOSIS — M5414 Radiculopathy, thoracic region: Secondary | ICD-10-CM

## 2023-08-02 ENCOUNTER — Encounter: Payer: 59 | Attending: Internal Medicine | Admitting: Nutrition

## 2023-08-02 NOTE — Patient Instructions (Signed)
 Call Dexcom help line if questions or problems with dexcom Change sensor using PDM and phone every 10 days Change pod when empty Do correction doses for all meals and snacks Call OmniPod help line if questions about pump Call office if blood sugar drop low, or if remain over 250

## 2023-08-02 NOTE — Progress Notes (Addendum)
 Patient is here with his wife to learn how to use the Dexcom G7 sensors and OmniPod 5 insulin pump.  He was on a Medtronic pump several years ago and knows how pumps work.  He was trained on the use of the Dexcom g7 sensors and one was given to him because he says they have not been ordered as yet.  Lot # [id number] Exp. 05/25/24.  This was started on his left arm and linked to his phone and to the PDM and to Clarity app and [facility].  We discussed the difference between sensor readings and BG readings.  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 They were both trained on the use of the OmniPod 5 insulin pump:  Settings were put into the pump per Dr. Lavina Pou orders.  His TDD was 93u of insulin.  Basal rate: 1.55u/hr, I/C; 10, ISF:12, target: 120 with correction over 140 by his request.  Timing was 4 hours, max basal 3.1u/hr and max bolus 25.  His blood sugar was 310 and he was shown how to do a correction dose.  He did this without difficulty,  He re demonstrated how to give a bolus correctly and re verbalized the need to give insulin for all meals and snacks.  We reviewed all topics on the training checklist and signed this indicating complete understanding of all topics   He had no final questions.  Patient is linked to [facility]:  User name: [email or web address],  Pw: [email or web address]! We reviewed the need to change pod when empty, bolus for all meals and snacks and do correction doses on all blood sugar reading over 250.

## 2023-08-03 ENCOUNTER — Telehealth: Payer: Self-pay | Admitting: Nutrition

## 2023-08-03 NOTE — Telephone Encounter (Signed)
 Patient reported no difficulty giving boluses and denied any low blood sugars.  FBS just now-on waking was 105.  This is below his target, but patient is happy with this.  Denies any low blood sugar symptoms. Glooko shows all readings within the target range except for initial reading when started at 310.  He had no questions for me at this time

## 2023-08-10 ENCOUNTER — Encounter: Payer: Self-pay | Admitting: Internal Medicine

## 2023-08-11 ENCOUNTER — Ambulatory Visit
Admission: RE | Admit: 2023-08-11 | Discharge: 2023-08-11 | Disposition: A | Discharge status: Home / Self Care | Payer: 59 | Source: Ambulatory Visit | Attending: Rehabilitation | Admitting: Rehabilitation

## 2023-08-11 DIAGNOSIS — M5414 Radiculopathy, thoracic region: Secondary | ICD-10-CM

## 2023-11-03 ENCOUNTER — Other Ambulatory Visit: Payer: Self-pay | Admitting: Internal Medicine

## 2023-11-03 DIAGNOSIS — E1142 Type 2 diabetes mellitus with diabetic polyneuropathy: Secondary | ICD-10-CM

## 2023-11-09 ENCOUNTER — Other Ambulatory Visit: Payer: Self-pay

## 2023-11-09 ENCOUNTER — Other Ambulatory Visit: Payer: Self-pay | Admitting: Urology

## 2023-11-17 NOTE — Patient Instructions (Signed)
 SURGICAL WAITING ROOM VISITATION  Patients having surgery or a procedure may have no more than 2 support people in the waiting area - these visitors may rotate.    Children under the age of 71 must have an adult with them who is not the patient.  Due to an increase in RSV and influenza rates and associated hospitalizations, children ages 37 and under may not visit patients in [facility] hospitals.  Visitors with respiratory illnesses are discouraged from visiting and should remain at home.  If the patient needs to stay at the hospital during part of their recovery, the visitor guidelines for inpatient rooms apply. Pre-op nurse will coordinate an appropriate time for 1 support person to accompany patient in pre-op.  This support person may not rotate.    Please refer to the [facility] website for the visitor guidelines for Inpatients (after your surgery is over and you are in a regular room).       Your procedure is scheduled on: 11/21/23   Report to [facility] Main Entrance    Report to admitting at 2:15 PM   Call this number if you have problems the morning of surgery [phone]   Do not eat food or drink liquids:After Midnight.But may have sips of water with meds        Oral Hygiene is also important to reduce your risk of infection.                                    Remember - BRUSH YOUR TEETH THE MORNING OF SURGERY WITH YOUR REGULAR TOOTHPASTE   Stop all vitamins and herbal supplements 7 days before surgery.   Take these medicines the morning of surgery with A SIP OF WATER: atorvastatin, cipro , clarinex, duloxetine(cymbalta), linzess, myrbetriq, torsemide(demadex)  DO NOT TAKE ANY ORAL DIABETIC MEDICATIONS DAY OF YOUR SURGERY  Bring CPAP mask and tubing day of surgery.                              You may not have any metal on your body including hair pins, jewelry, and body piercing             Do not wear make-up, lotions, powders, perfumes/cologne, or  deodorant  Do not wear nail polish including gel and S&S, artificial/acrylic nails, or any other type of covering on natural nails including finger and toenails. If you have artificial nails, gel coating, etc. that needs to be removed by a nail salon please have this removed prior to surgery or surgery may need to be canceled/ delayed if the surgeon/ anesthesia feels like they are unable to be safely monitored.   Do not shave  48 hours prior to surgery.               Men may shave face and neck.   Do not bring valuables to the hospital. [facility] IS NOT             RESPONSIBLE   FOR VALUABLES.   Contacts, glasses, dentures or bridgework may not be worn into surgery.  DO NOT BRING YOUR HOME MEDICATIONS TO THE HOSPITAL. PHARMACY WILL DISPENSE MEDICATIONS LISTED ON YOUR MEDICATION LIST TO YOU DURING YOUR ADMISSION IN THE HOSPITAL!    Patients discharged on the day of surgery will not be allowed to drive home.  Someone NEEDS to  stay with you for the first 24 hours after anesthesia.   Special Instructions: Bring a copy of your healthcare power of attorney and living will documents the day of surgery if you haven't scanned them before.              Please read over the following fact sheets you were given: IF YOU HAVE QUESTIONS ABOUT YOUR PRE-OP INSTRUCTIONS PLEASE CALL [phone] Ammon Bales   If you received a COVID test during your pre-op visit  it is requested that you wear a mask when out in public, stay away from anyone that may not be feeling well and notify your surgeon if you develop symptoms. If you test positive for Covid or have been in contact with anyone that has tested positive in the last 10 days please notify you surgeon.    [facility] - Preparing for Surgery Before surgery, you can play an important role.  Because skin is not sterile, your skin needs to be as free of germs as possible.  You can reduce the number of germs on your skin by washing with CHG (chlorahexidine  gluconate) soap before surgery.  CHG is an antiseptic cleaner which kills germs and bonds with the skin to continue killing germs even after washing. Please DO NOT use if you have an allergy to CHG or antibacterial soaps.  If your skin becomes reddened/irritated stop using the CHG and inform your nurse when you arrive at Short Stay. Do not shave (including legs and underarms) for at least 48 hours prior to the first CHG shower.  You may shave your face/neck.  Please follow these instructions carefully:  1.  Shower with CHG Soap the night before surgery and the  morning of surgery.  2.  If you choose to wash your hair, wash your hair first as usual with your normal  shampoo.  3.  After you shampoo, rinse your hair and body thoroughly to remove the shampoo.                             4.  Use CHG as you would any other liquid soap.  You can apply chg directly to the skin and wash.  Gently with a scrungie or clean washcloth.  5.  Apply the CHG Soap to your body ONLY FROM THE NECK DOWN.   Do   not use on face/ open                           Wound or open sores. Avoid contact with eyes, ears mouth and   genitals (private parts).                       Wash face,  Genitals (private parts) with your normal soap.             6.  Wash thoroughly, paying special attention to the area where your    surgery  will be performed.  7.  Thoroughly rinse your body with warm water from the neck down.  8.  DO NOT shower/wash with your normal soap after using and rinsing off the CHG Soap.                9.  Pat yourself dry with a clean towel.            10.  Wear clean pajamas.  11.  Place clean sheets on your bed the night of your first shower and do not  sleep with pets. Day of Surgery : Do not apply any lotions/deodorants the morning of surgery.  Please wear clean clothes to the hospital/surgery center.  FAILURE TO FOLLOW THESE INSTRUCTIONS MAY RESULT IN THE CANCELLATION OF YOUR SURGERY  PATIENT  SIGNATURE_________________________________  NURSE SIGNATURE__________________________________  ________________________________________________________________________How to Manage Your Diabetes Before and After Surgery  Why is it important to control my blood sugar before and after surgery? Improving blood sugar levels before and after surgery helps healing and can limit problems. A way of improving blood sugar control is eating a healthy diet by:  Eating less sugar and carbohydrates  Increasing activity/exercise  Talking with your doctor about reaching your blood sugar goals High blood sugars (greater than 180 mg/dL) can raise your risk of infections and slow your recovery, so you will need to focus on controlling your diabetes during the weeks before surgery. Make sure that the doctor who takes care of your diabetes knows about your planned surgery including the date and location.  How do I manage my blood sugar before surgery? Check your blood sugar at least 4 times a day, starting 2 days before surgery, to make sure that the level is not too high or low. Check your blood sugar the morning of your surgery when you wake up and every 2 hours until you get to the Short Stay unit. If your blood sugar is less than 70 mg/dL, you will need to treat for low blood sugar: Do not take insulin . Treat a low blood sugar (less than 70 mg/dL) with  cup of clear juice (cranberry or apple), 4 glucose tablets, OR glucose gel. Recheck blood sugar in 15 minutes after treatment (to make sure it is greater than 70 mg/dL). If your blood sugar is not greater than 70 mg/dL on recheck, call [phone] for further instructions. Report your blood sugar to the short stay nurse when you get to Short Stay.  If you are admitted to the hospital after surgery: Your blood sugar will be checked by the staff and you will probably be given insulin  after surgery (instead of oral diabetes medicines) to make sure you have  good blood sugar levels. The goal for blood sugar control after surgery is 80-180 mg/dL.   WHAT DO I DO ABOUT MY DIABETES MEDICATION?   If your CBG is greater than 220 mg/dL, you may take  of your sliding scale  (correction) dose of insulin  the morning of surgery.    For patients with insulin  pumps: Contact your diabetes doctor for specific instructions before surgery. Decrease basal rates by 20% at midnight the night before your surgery. Note that if your surgery is planned to be longer than 2 hours, your insulin  pump will be removed and intravenous (IV) insulin  will be started and managed by the nurses and the anesthesiologist. You will be able to restart your insulin  pump once you are awake and able to manage it.  Make sure to bring insulin  pump supplies to the hospital with you in case the  site needs to be changed.  Patient Signature:  Date:   Nurse Signature:  Date:

## 2023-11-17 NOTE — Progress Notes (Incomplete)
 COVID Vaccine received:  []  No [x]  Yes Date of any COVID positive Test in last 90 days: no PCP - Alford Angus MD Cardiologist - [facility] in [place]. Neurologist-Amy Delmas Ferrari PA-C-[facility]  Chest x-ray -  EKG -  11/18/23 Epic Stress Test -  ECHO - 08/25/21 Epic Cardiac Cath -   Bowel Prep - [x]  No  []   Yes ______  Pacemaker / ICD device [x]  No []  Yes   Spinal Cord Stimulator:[x]  No []  Yes       History of Sleep Apnea? []  No [x]  Yes   CPAP used?- []  No [x]  Yes    Does the patient monitor blood sugar?          []  No [x]  Yes  []  N/A  Patient has: []  NO Hx DM   []  Pre-DM                 [x]  DM1  []   DM2 Does patient have a [employer] or Dexacom? []  No [x]  Yes   Fasting Blood Sugar Ranges- 140's Checks Blood Sugar ___10__ times a day  GLP1 agonist / usual dose - no GLP1 instructions:  SGLT-2 inhibitors / usual dose - no SGLT-2 instructions:   Blood Thinner / Instructions:no Aspirin Instructions:no  Comments:   Activity level: Patient is able  to climb a flight of stairs without difficulty; [x]  No CP  [x]  No SOB,    Patient can  perform ADLs without assistance.   Anesthesia review: DM1, HTN, OSA, Murmur  Patient denies shortness of breath, fever, cough and chest pain at PAT appointment.  Patient verbalized understanding and agreement to the Pre-Surgical Instructions that were given to them at this PAT appointment. Patient was also educated of the need to review these PAT instructions again prior to his/her surgery.I reviewed the appropriate phone numbers to call if they have any and questions or concerns.

## 2023-11-18 ENCOUNTER — Other Ambulatory Visit: Payer: Self-pay

## 2023-11-18 ENCOUNTER — Encounter (HOSPITAL_COMMUNITY)
Admission: RE | Admit: 2023-11-18 | Discharge: 2023-11-18 | Disposition: A | Discharge status: Home / Self Care | Source: Ambulatory Visit | Attending: Urology | Admitting: Urology

## 2023-11-18 ENCOUNTER — Encounter: Payer: Self-pay | Admitting: Internal Medicine

## 2023-11-18 ENCOUNTER — Encounter (HOSPITAL_COMMUNITY): Payer: Self-pay

## 2023-11-18 VITALS — BP 129/89 | HR 73 | Temp 97.9°F | Resp 16 | Ht 67.0 in | Wt 268.0 lb

## 2023-11-18 DIAGNOSIS — E1169 Type 2 diabetes mellitus with other specified complication: Secondary | ICD-10-CM | POA: Diagnosis not present

## 2023-11-18 DIAGNOSIS — Z01818 Encounter for other preprocedural examination: Secondary | ICD-10-CM | POA: Diagnosis present

## 2023-11-18 DIAGNOSIS — K3189 Other diseases of stomach and duodenum: Secondary | ICD-10-CM | POA: Diagnosis not present

## 2023-11-18 DIAGNOSIS — I10 Essential (primary) hypertension: Secondary | ICD-10-CM | POA: Insufficient documentation

## 2023-11-18 HISTORY — DX: Headache, unspecified: R51.9

## 2023-11-18 HISTORY — DX: Cardiac murmur, unspecified: R01.1

## 2023-11-18 LAB — BASIC METABOLIC PANEL WITH GFR
Anion gap: 12 (ref 5–15)
BUN: 20 mg/dL (ref 6–20)
CO2: 29 mmol/L (ref 22–32)
Calcium: 9.7 mg/dL (ref 8.9–10.3)
Chloride: 98 mmol/L (ref 98–111)
Creatinine, Ser: 0.95 mg/dL (ref 0.61–1.24)
GFR, Estimated: >60 mL/min (ref >60)
Glucose, Bld: 114 mg/dL — ABNORMAL HIGH (ref 70–99)
Potassium: 4.4 mmol/L (ref 3.5–5.1)
Sodium: 139 mmol/L (ref 135–145)

## 2023-11-18 LAB — CBC
HCT: 52.4 % — ABNORMAL HIGH (ref 39.0–52.0)
Hemoglobin: 17.3 g/dL — ABNORMAL HIGH (ref 13.0–17.0)
MCH: 30 pg (ref 26.0–34.0)
MCHC: 33 g/dL (ref 30.0–36.0)
MCV: 90.8 fL (ref 80.0–100.0)
Platelets: 240 10*3/uL (ref 150–400)
RBC: 5.77 MIL/uL (ref 4.22–5.81)
RDW: 12.7 % (ref 11.5–15.5)
WBC: 10.3 10*3/uL (ref 4.0–10.5)
nRBC: 0 % (ref 0.0–0.2)

## 2023-11-18 LAB — HEMOGLOBIN A1C
Hgb A1c MFr Bld: 6.4 % — ABNORMAL HIGH (ref 4.8–5.6)
Mean Plasma Glucose: 136.98 mg/dL

## 2023-11-21 ENCOUNTER — Ambulatory Visit (HOSPITAL_COMMUNITY): Payer: Self-pay | Admitting: Physician Assistant

## 2023-11-21 ENCOUNTER — Other Ambulatory Visit: Payer: Self-pay

## 2023-11-21 ENCOUNTER — Ambulatory Visit (HOSPITAL_BASED_OUTPATIENT_CLINIC_OR_DEPARTMENT_OTHER): Admitting: Certified Registered Nurse Anesthetist

## 2023-11-21 ENCOUNTER — Ambulatory Visit (HOSPITAL_COMMUNITY)
Admission: RE | Admit: 2023-11-21 | Discharge: 2023-11-21 | Disposition: A | Discharge status: Home / Self Care | Attending: Urology | Admitting: Urology

## 2023-11-21 ENCOUNTER — Encounter (HOSPITAL_COMMUNITY): Payer: Self-pay | Admitting: Urology

## 2023-11-21 ENCOUNTER — Encounter: Payer: Self-pay | Admitting: Internal Medicine

## 2023-11-21 ENCOUNTER — Encounter (HOSPITAL_COMMUNITY)
Admission: RE | Disposition: A | Discharge status: Home / Self Care | Payer: Self-pay | Source: Home / Self Care | Attending: Urology

## 2023-11-21 DIAGNOSIS — E119 Type 2 diabetes mellitus without complications: Secondary | ICD-10-CM

## 2023-11-21 DIAGNOSIS — Z6841 Body Mass Index (BMI) 40.0 and over, adult: Secondary | ICD-10-CM | POA: Insufficient documentation

## 2023-11-21 DIAGNOSIS — I10 Essential (primary) hypertension: Secondary | ICD-10-CM | POA: Diagnosis not present

## 2023-11-21 DIAGNOSIS — E1143 Type 2 diabetes mellitus with diabetic autonomic (poly)neuropathy: Secondary | ICD-10-CM | POA: Insufficient documentation

## 2023-11-21 DIAGNOSIS — N312 Flaccid neuropathic bladder, not elsewhere classified: Secondary | ICD-10-CM | POA: Diagnosis not present

## 2023-11-21 DIAGNOSIS — J45909 Unspecified asthma, uncomplicated: Secondary | ICD-10-CM | POA: Insufficient documentation

## 2023-11-21 DIAGNOSIS — R519 Headache, unspecified: Secondary | ICD-10-CM | POA: Diagnosis not present

## 2023-11-21 DIAGNOSIS — M1990 Unspecified osteoarthritis, unspecified site: Secondary | ICD-10-CM | POA: Diagnosis not present

## 2023-11-21 DIAGNOSIS — N3281 Overactive bladder: Secondary | ICD-10-CM | POA: Insufficient documentation

## 2023-11-21 DIAGNOSIS — E66813 Obesity, class 3: Secondary | ICD-10-CM | POA: Insufficient documentation

## 2023-11-21 DIAGNOSIS — Z794 Long term (current) use of insulin: Secondary | ICD-10-CM | POA: Insufficient documentation

## 2023-11-21 DIAGNOSIS — N310 Uninhibited neuropathic bladder, not elsewhere classified: Secondary | ICD-10-CM | POA: Diagnosis not present

## 2023-11-21 DIAGNOSIS — G4730 Sleep apnea, unspecified: Secondary | ICD-10-CM | POA: Diagnosis not present

## 2023-11-21 DIAGNOSIS — Z79899 Other long term (current) drug therapy: Secondary | ICD-10-CM | POA: Diagnosis not present

## 2023-11-21 HISTORY — PX: CYSTOSCOPY WITH INJECTION: SHX1424

## 2023-11-21 LAB — GLUCOSE, CAPILLARY: Glucose-Capillary: 128 mg/dL — ABNORMAL HIGH (ref 70–99)

## 2023-11-21 SURGERY — CYSTOSCOPY, WITH INJECTION OF BLADDER NECK OR BLADDER WALL
Anesthesia: General

## 2023-11-21 MED ORDER — CHLORHEXIDINE GLUCONATE 0.12 % MT SOLN
15.0000 mL | Freq: Once | OROMUCOSAL | Status: AC
  Administered 2023-11-21: 15 mL via OROMUCOSAL

## 2023-11-21 MED ORDER — FENTANYL CITRATE (PF) 100 MCG/2ML IJ SOLN
INTRAMUSCULAR | Status: DC | PRN
Start: 2023-11-21 — End: 2023-11-21
  Administered 2023-11-21: 25 ug via INTRAVENOUS
  Administered 2023-11-21: 75 ug via INTRAVENOUS

## 2023-11-21 MED ORDER — SODIUM CHLORIDE (PF) 0.9 % IJ SOLN
INTRAMUSCULAR | Status: AC
  Filled 2023-11-21: qty 10

## 2023-11-21 MED ORDER — FENTANYL CITRATE PF 50 MCG/ML IJ SOSY: 25.0000 ug | PREFILLED_SYRINGE | INTRAMUSCULAR | Status: DC | PRN

## 2023-11-21 MED ORDER — SODIUM CHLORIDE 0.9 % IR SOLN
Status: DC | PRN
  Administered 2023-11-21: 3000 mL

## 2023-11-21 MED ORDER — PROPOFOL 10 MG/ML IV BOLUS
INTRAVENOUS | Status: AC
  Filled 2023-11-21: qty 20

## 2023-11-21 MED ORDER — ONDANSETRON HCL 4 MG/2ML IJ SOLN
INTRAMUSCULAR | Status: DC | PRN
  Administered 2023-11-21: 4 mg via INTRAVENOUS

## 2023-11-21 MED ORDER — ONDANSETRON HCL 4 MG/2ML IJ SOLN
INTRAMUSCULAR | Status: AC
  Filled 2023-11-21: qty 2

## 2023-11-21 MED ORDER — LACTATED RINGERS IV SOLN: INTRAVENOUS | Status: DC

## 2023-11-21 MED ORDER — PROPOFOL 10 MG/ML IV BOLUS
INTRAVENOUS | Status: DC | PRN
  Administered 2023-11-21 (×2): 50 mg via INTRAVENOUS
  Administered 2023-11-21: 200 mg via INTRAVENOUS

## 2023-11-21 MED ORDER — ONABOTULINUMTOXINA 100 UNITS IJ SOLR
INTRAMUSCULAR | Status: AC
  Filled 2023-11-21: qty 100

## 2023-11-21 MED ORDER — GENTAMICIN SULFATE 40 MG/ML IJ SOLN
5.0000 mg/kg | INTRAVENOUS | Status: AC
  Administered 2023-11-21: 428 mg via INTRAVENOUS
  Filled 2023-11-21: qty 10.75

## 2023-11-21 MED ORDER — LIDOCAINE HCL (PF) 2 % IJ SOLN
INTRAMUSCULAR | Status: AC
  Filled 2023-11-21: qty 5

## 2023-11-21 MED ORDER — FENTANYL CITRATE (PF) 100 MCG/2ML IJ SOLN
INTRAMUSCULAR | Status: AC
  Filled 2023-11-21: qty 2

## 2023-11-21 MED ORDER — LIDOCAINE HCL (CARDIAC) PF 100 MG/5ML IV SOSY
PREFILLED_SYRINGE | INTRAVENOUS | Status: DC | PRN
  Administered 2023-11-21: 60 mg via INTRAVENOUS

## 2023-11-21 MED ORDER — ORAL CARE MOUTH RINSE: 15.0000 mL | Freq: Once | OROMUCOSAL | Status: AC

## 2023-11-21 MED ORDER — ONABOTULINUMTOXINA 100 UNITS IJ SOLR
INTRAMUSCULAR | Status: DC | PRN
Start: 2023-11-21 — End: 2023-11-21
  Administered 2023-11-21: 200 [IU] via INTRAMUSCULAR

## 2023-11-21 SURGICAL SUPPLY — 15 items
BAG URO CATCHER STRL LF (MISCELLANEOUS) ×1 IMPLANT
CATH FOLEY 2WAY SLVR 5CC 18FR (CATHETERS) IMPLANT
CLOTH BEACON ORANGE TIMEOUT ST (SAFETY) ×1 IMPLANT
GLOVE BIO SURGEON STRL SZ7.5 (GLOVE) ×1 IMPLANT
GOWN STRL REUS W/ TWL XL LVL3 (GOWN DISPOSABLE) ×2 IMPLANT
KIT TURNOVER KIT A (KITS) IMPLANT
MANIFOLD NEPTUNE II (INSTRUMENTS) ×1 IMPLANT
NDL ASPIRATION 22 (NEEDLE) ×1 IMPLANT
NDL SAFETY ECLIPSE 18X1.5 (NEEDLE) IMPLANT
NEEDLE ASPIRATION 22 (NEEDLE) ×1 IMPLANT
PACK CYSTO (CUSTOM PROCEDURE TRAY) ×1 IMPLANT
SPONGE DRAIN TRACH 4X4 STRL 2S (GAUZE/BANDAGES/DRESSINGS) IMPLANT
SYR CONTROL 10ML LL (SYRINGE) IMPLANT
TUBING CONNECTING 10 (TUBING) IMPLANT
WATER STERILE IRR 3000ML UROMA (IV SOLUTION) ×1 IMPLANT

## 2023-11-21 NOTE — Op Note (Signed)
 Operative Note  Preoperative diagnosis:  1.  Detrusor overactivity  Postoperative diagnosis: 1.  Detrusor overactivity  Procedure(s): 1.  Cystoscopy with 200 units of intradetrusor Botox  Surgeon: Leila Punt, MD  Assistants: None  Anesthesia: General  Complications: None immediate  EBL: Minimal  Specimens: 1.  None  Drains/Catheters: 1.  18 French Foley catheter as a suprapubic tube  Intraoperative findings: Normal urethra and bladder  Indication: 48 year old male transgender patient with urinary retention and detrusor overactivity managed with Foley catheter presents for intra detrusor Botox.  Description of procedure:  The patient was identified and consent was obtained.  The patient was taken to the operating room and placed in the supine position.  The patient was placed under general anesthesia.  Perioperative antibiotics were administered.  The patient was placed in dorsal lithotomy.  Patient was prepped and draped in a standard sterile fashion and a timeout was performed.  A rigid cystoscope was advanced into the urethra and into the bladder.  200 units of Botox was systematically injected throughout the bladder.  There was no significant active bleeding noted.  I exchanged the suprapubic tube for another 18 French catheter.  This concluded the operation.  Patient tolerated the procedure well was stable postoperatively.  Plan: Repeat Botox in 6 months if it is successful for him

## 2023-11-21 NOTE — OR Nursing (Signed)
 Patient has insulin  pump with continuous Humalog  basal rate at 1.55u; patient does not want to remove for surgery.  On his Freestyle Libra continuous monitor trending 140s in last 12 hours.  Our blood sugar reading shows 128. Per anesthesia, patient to keep insulin  pump on for procedure.

## 2023-11-21 NOTE — Anesthesia Procedure Notes (Signed)
 Procedure Name: LMA Insertion Date/Time: 11/21/2023 5:28 PM  Performed by: Elaina Graver, CRNAPre-anesthesia Checklist: Patient identified, Emergency Drugs available, Suction available and Patient being monitored Patient Re-evaluated:Patient Re-evaluated prior to induction Oxygen  Delivery Method: Circle System Utilized Preoxygenation: Pre-oxygenation with 100% oxygen  Induction Type: IV induction Ventilation: Mask ventilation without difficulty LMA: LMA inserted LMA Size: 4.0 Number of attempts: 1 Airway Equipment and Method: Bite block Placement Confirmation: positive ETCO2 Tube secured with: Tape Dental Injury: Teeth and Oropharynx as per pre-operative assessment

## 2023-11-21 NOTE — Anesthesia Preprocedure Evaluation (Addendum)
 Anesthesia Evaluation  Patient identified by MRN, date of birth, ID band Patient awake    Reviewed: Allergy & Precautions, H&P , NPO status , Patient's Chart, lab work & pertinent test results  Airway Mallampati: II  TM Distance: >3 FB Neck ROM: Full    Dental no notable dental hx. (+) Teeth Intact, Dental Advisory Given   Pulmonary asthma , sleep apnea    Pulmonary exam normal breath sounds clear to auscultation       Cardiovascular hypertension, Pt. on medications  Rhythm:Regular Rate:Normal     Neuro/Psych  Headaches  negative psych ROS   GI/Hepatic negative GI ROS, Neg liver ROS,,,  Endo/Other  diabetes, Insulin  Dependent  Class 3 obesity  Renal/GU negative Renal ROS  negative genitourinary   Musculoskeletal  (+) Arthritis , Osteoarthritis,    Abdominal   Peds  Hematology negative hematology ROS (+)   Anesthesia Other Findings   Reproductive/Obstetrics negative OB ROS                             Anesthesia Physical Anesthesia Plan  ASA: 3  Anesthesia Plan: General   Post-op Pain Management: Ofirmev  IV (intra-op)*   Induction: Intravenous  PONV Risk Score and Plan: 3 and Ondansetron , Dexamethasone  and Midazolam   Airway Management Planned: LMA  Additional Equipment:   Intra-op Plan:   Post-operative Plan: Extubation in OR  Informed Consent: I have reviewed the patients History and Physical, chart, labs and discussed the procedure including the risks, benefits and alternatives for the proposed anesthesia with the patient or authorized representative who has indicated his/her understanding and acceptance.     Dental advisory given  Plan Discussed with: CRNA  Anesthesia Plan Comments:        Anesthesia Quick Evaluation

## 2023-11-21 NOTE — Discharge Instructions (Addendum)
 May have some blood in the urine.  This is normal as long as her catheter continues to drain.

## 2023-11-21 NOTE — H&P (Signed)
 CC/HPI: CC: Possible urinary retention  HPI:  05/21/2021  48 year old male transgender patient. 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 He has a history of diabetes since teenage years. Also has a complex history of pelvic floor dysfunction with autonomic neuropathy of the gastrointestinal system which required colostomy in 2016. Primary complaint is worsening lower urinary tract symptoms especially over the last 9 months. Patient primary complaint is difficulty voiding and bladder pain/discomfort as well as history of recurrent UTIs. Unable to leave urine sample today. Has to Valsalva and Crede maneuver in order to void. Recently had a hysterectomy back in August. Postoperative course was unremarkable and that this was completed 3 weeks ago. Has a reported vaginal stenosis and not able to advance a pediatric speculum.   07/09/2021  Patient underwent urodynamics. This revealed an areflexic bladder. Unfortunately unable to generate a contraction. The patient has done a great deal of reading and personal investigation and would like to proceed with suprapubic tube placement. Renal ultrasound today showed no hydronephrosis bilaterally.   08/03/2021: Patient underwent suprapubic placement with interventional radiology on 07/24/2021. 14 French catheter placed with plans to replace/upsize again in 6 weeks from time of its placement. Today and for the past week the patient is complaining of severe lower abdominal/suprapubic pain and discomfort that has been intermittently occurring, exacerbated with position changes and prolonged sitting. Urine output from the suprapubic tube has been appropriate, patient denies any leaking from the urethra or from around catheter tubing. Denies gross hematuria. Patient has a known colostomy output has been appropriate by the report, no evidence of constipation. Denies blood in urine, blood in stool. Denies any  increased mucus or other abnormal fecal material from the colostomy itself. Patient states pain has been severe to the point of nausea but denies vomiting. No correlating unilateral lower back or flank pain/discomfort. Denies fevers or chills.   09/23/2021  Patient is status post upsizing of suprapubic catheter to 18 [place] on 08/31/2021. Very pleased with how they are doing. No longer having any flank pain. Urine draining very well. Having occasional bladder spasms that they take oxybutynin as needed.   10/13/2021  Patient presents for suprapubic tube exchange. This is their first 1 since having it upsized. Overall doing well. A little bit nervous about the exchange.   12/17/2021  Patient had an increase in bladder spasms and by accident stopped oxybutynin. Spasms actually improved after stopping oxybutynin. Went several days without a spasm and then had a couple yesterday. Wanted to talk about options in regards to this. They are currently taking doxycycline for rosacea.   12/30/2022:  Patient presents with their fianc at their side, with request to learn how to self catheterize their suprapubic stoma. She is in nursing school. They unfortunately did not make their earlier appointment today with their urologist. They had wished to also discuss their bothersome bladder spasticity. Since last seen, patient has been undergoing regular SP tube changes, without any interim concerns. Patient states that they have been spending a lot of time in the pool exercising with their fianc. However, upon entering water, patient experiences leg and bladder spasms, initially rather bothersome, but they do subside. Their bladder spasms also occur at random. Patient endorses occasional urethral incontinence with bladder spasms. Today, they deny frank irritative symptoms, gross hematuria, fever/chills, nausea/vomiting.   11/04/2018: Greison is a 48 year old who presents for concerning bladder spasms and urinary leakage.  Recently treated for a UTI but was  traveling and lost the antibiotics so did not finish the course. Spike reports that the pain returned and bladder spasms are recurring. Jacksons fiance changes the suprapubic tube.     ALLERGIES: Contrast Dye Fexofenadine Morphine Hcl Penicillin Shellfish    MEDICATIONS: Lisinopril   Myrbetriq 50 MG Tablet Extended Release 24 Hour 1 tablet PO Daily  oxyBUTYnin Chloride 5 MG Tablet 1 tablet PO TID PRN  oxyBUTYnin Chloride 5 MG Tablet 1 tablet PO TID PRN  Simvastatin  Zyrtec  Albuterol  Sulfate  Bactrim  Calcium  Lantus   Maxalt  Novolog   Omega 3  Testosterone   Vitamin B12  Vitamin D      GU PSH: Complex cystometrogram, w/ void pressure and urethral pressure profile studies, any technique - 07/01/2021 Complex Uroflow - 07/01/2021 Emg surf Electrd - 07/01/2021 Inject For cystogram - 07/01/2021 Intrabd voidng Press - 07/01/2021 Simple Change SP Tube - 09/21/2023, 12/24/2022, 11/18/2022, 10/14/2022, 09/16/2022, 08/10/2022, 06/28/2022, 05/19/2022, 04/28/2022, 03/05/2022, 01/08/2022, 11/20/2021, 2023       PSH Notes: Spinal Fusion      NON-GU PSH: Breast mastectomy, Bilateral Cholecystectomy (laparoscopic) Colostomy Hernia Repair Hysterectomy     GU PMH: Urinary Retention - 09/21/2023, - 12/30/2022, - 12/24/2022, - 11/18/2022, - 10/14/2022, - 09/16/2022, - 08/10/2022, - 06/28/2022, - 05/19/2022, - 04/28/2022, - 12/17/2021, - 11/20/2021, - 2023, - 2023, - 07/09/2021, - 07/01/2021, - 05/21/2021 Areflexic bladder - 12/30/2022, - 01/08/2022, - 12/17/2021, - 07/09/2021 Unihibited neuropathic bladder - 03/05/2022, - 2023, - 2023, - 2023, - 07/09/2021 Acute Cystitis/UTI - 2023    NON-GU PMH: Arthritis Diabetes Type 2 GERD Hypercholesterolemia Hypertension Other rosacea Sleep Apnea    FAMILY HISTORY: Brain Cancer - Father Diabetes - Runs in Family   SOCIAL HISTORY: Marital Status: Single Current Smoking Status: Patient has never smoked.  Drinks 1 drinks per week.  Light Drinker.  Does not use drugs. Does not drink caffeine. Has not had a blood transfusion. Patient's occupation is/was disabled.    REVIEW OF SYSTEMS:    GU Review Male:   Patient denies burning/ pain with urination, get up at night to urinate, leakage of urine, stream starts and stops, trouble starting your stream, have to strain to urinate , erection problems, and penile pain.  Gastrointestinal (Upper):   Patient denies nausea, vomiting, and indigestion/ heartburn.  Gastrointestinal (Lower):   Patient denies diarrhea and constipation.  Constitutional:   Patient denies fever, night sweats, weight loss, and fatigue.  Cardiovascular:   Patient denies leg swelling and chest pains.   Notes: Bladder spasm    VITAL SIGNS:      11/04/2023 02:30 PM  Weight 270 lb / 122.47 kg  Height 68 in / 172.72 cm  BP 124/84 mmHg  Heart Rate 77 /min  Temperature 98.2 F / 36.7 C  BMI 41.0 kg/m   MULTI-SYSTEM PHYSICAL EXAMINATION:    Constitutional: Well-nourished. No physical deformities. Normally developed. Good grooming.  Cardiovascular: Normal temperature, normal extremity pulses, no swelling, no varicosities.  Skin: No paleness, no jaundice, no cyanosis. No lesion, no ulcer, no rash.  Neurologic / Psychiatric: Oriented to time, oriented to place, oriented to person. No depression, no anxiety, no agitation.  Gastrointestinal: Obese abdomen. No mass, no tenderness, no rigidity.      Complexity of Data:  Source Of History:  Patient  Records Review:   Previous Doctor Records, Previous Hospital Records, Previous Patient Records  Urine Test Review:   Urine Culture   PROCEDURES:          Visit Complexity -  N5621    ASSESSMENT:      ICD-10 Details  1 GU:   Areflexic bladder - N31.2 Chronic, Stable  2   Urinary Retention - R33.8 Chronic, Stable  3   Acute Cystitis/UTI - N30.00 Chronic, Stable   PLAN:            Medications New Meds: Bactrim DS 800-160 MG Tablet 1 tablet PO BID   #20  0  Refill(s)  Pharmacy Name:  [facility] [phone]  Address:  [address]   [place], [place] [id number]  Phone:  [phone]  Fax:  [phone]            Orders Labs CULTURE, URINE          Document Letter(s):  Created for Patient: Clinical Summary         Notes:   1. Acute cystitis: Urine was sent for culture. Since Grahame lost his medication for his last positive culture I will treat according to those results. I sent in Bactrim. Will follow-up regarding results of urine culture. He is also interested in bladder Botox. He would like to have this done in the OR if possible and be put to sleep. He does have a lot of urinary leakage and spasms.        Next Appointment:      Next Appointment: 12/30/2023 11:30 AM    Appointment Type: Office Visit Established Patient    Location: [facility] [phone]    Provider: Leila Punt, III, M.D.    Reason for Visit: f/u/ pt sch      Signed by Raymon Caldron, NP on 11/04/23 at 3:20 PM (EDT

## 2023-11-21 NOTE — Anesthesia Postprocedure Evaluation (Signed)
 Anesthesia Post Note  Patient: DOYEL TALBOT  Procedure(s) Performed: CYSTOSCOPY, WITH INJECTION OF BLADDER NECK OR BLADDER WALL     Patient location during evaluation: PACU Anesthesia Type: General Level of consciousness: awake and alert Pain management: pain level controlled Vital Signs Assessment: post-procedure vital signs reviewed and stable Respiratory status: spontaneous breathing, nonlabored ventilation and respiratory function stable Cardiovascular status: blood pressure returned to baseline and stable Postop Assessment: no apparent nausea or vomiting Anesthetic complications: no  No notable events documented.  Last Vitals:  Vitals:   11/21/23 1830 11/21/23 1841  BP: 121/82 (!) 136/98  Pulse: 66 71  Resp: 13   Temp: 36.5 C   SpO2: 98% 96%    Last Pain:  Vitals:   11/21/23 1841  TempSrc:   PainSc: 0-No pain                 Ladislav Caselli,W. EDMOND

## 2023-11-21 NOTE — Transfer of Care (Signed)
 Immediate Anesthesia Transfer of Care Note  Patient: Gavin Smith  Procedure(s) Performed: CYSTOSCOPY, WITH INJECTION OF BLADDER NECK OR BLADDER WALL  Patient Location: PACU  Anesthesia Type:General  Level of Consciousness: drowsy  Airway & Oxygen  Therapy: Patient Spontanous Breathing and Patient connected to face mask oxygen   Post-op Assessment: Report given to RN and Post -op Vital signs reviewed and stable  Post vital signs: Reviewed and stable  Last Vitals:  Vitals Value Taken Time  BP 149/95 11/21/23 1807  Temp 36.4 C 11/21/23 1807  Pulse 104 11/21/23 1808  Resp 15 11/21/23 1808  SpO2 100 % 11/21/23 1808  Vitals shown include unfiled device data.  Last Pain:  Vitals:   11/21/23 1436  TempSrc: Oral  PainSc: 0-No pain      Patients Stated Pain Goal: 5 (11/21/23 1436)  Complications: No notable events documented.

## 2023-11-22 ENCOUNTER — Encounter (HOSPITAL_COMMUNITY): Payer: Self-pay | Admitting: Urology

## 2023-11-29 ENCOUNTER — Ambulatory Visit (INDEPENDENT_AMBULATORY_CARE_PROVIDER_SITE_OTHER): Admitting: Internal Medicine

## 2023-11-29 ENCOUNTER — Encounter: Payer: Self-pay | Admitting: Internal Medicine

## 2023-11-29 VITALS — BP 120/70 | HR 80 | Ht 67.0 in | Wt 285.8 lb

## 2023-11-29 DIAGNOSIS — E1142 Type 2 diabetes mellitus with diabetic polyneuropathy: Secondary | ICD-10-CM | POA: Diagnosis not present

## 2023-11-29 DIAGNOSIS — E7849 Other hyperlipidemia: Secondary | ICD-10-CM | POA: Diagnosis not present

## 2023-11-29 DIAGNOSIS — Z794 Long term (current) use of insulin: Secondary | ICD-10-CM | POA: Diagnosis not present

## 2023-11-29 DIAGNOSIS — E559 Vitamin D deficiency, unspecified: Secondary | ICD-10-CM | POA: Diagnosis not present

## 2023-11-29 NOTE — Progress Notes (Signed)
 Patient ID: Gavin Smith, adult   DOB: 09-12-1975, 48 y.o.   MRN: [id number]  HPI: Gavin Smith is a 48 y.o.-year-old adult, initially referred by his PCP, Dr. Meri Stammer, presenting for follow-up for DM2, dx in 46 (48 y/o), insulin -dependent, uncontrolled, wit complications (PN). Last visit 6 months ago.  He is here with his girlfriend.  Interim history:   Before last visit, he started to walk with his girlfriend-walking 4 to 5 miles a day and completing all the circles on his watch. They also joined [employer] together. He lost more than 60 pounds in the last 3 years. He lost 26 lbs since last OV. No increased urination, blurry vision, nausea, chest pain. Since last visit, he had cystoscopy with Botox  for worsening LUTS: difficulty voiding, bladder pain, recurrent UTIs, in the setting of pelvic floor dysfunction. Sugars got to 600s 1 mo ago - pod failed. He has bone pain. He suspects his vitamin D  level may be low again.  DM2: Reviewed history: He has a h/o autonomic neuropathy of the GI tract (dx in 1986, when he was 48 y/o - not related to DM - severe diarrhea: "food goes through me"), [place]. [place] >> permanent colostomy - had sx in 09/2014.   Reviewed HbA1c levels: Lab Results  Component Value Date   HGBA1C 6.4 (H) 11/18/2023   HGBA1C 7.4 (A) 06/02/2023   HGBA1C 6.4 (A) 11/30/2022  03/17/2022: HbA1c 6.7% 11/18/2020: HbA1c 6.9%02/20/2020: HbA1c 6.0%. 10/19/2019: HbA1c 6.7% 12/07/2018: HbA1c 6.8% 11/10/2016:The HbA1c calculated from the fructosamine was 7.3%, better than the measured HbA1c! 02/28/2015: HbA1c 6.9% 10/21/2014: HbA1c 6.9% - after changing from Humalog  to NovoLog   07/23/2014: HbA1c 10.9% ICA Abs negative (01/31/2012) C-pp 5.0, Glu 218 (01/25/2012)  He had high HbA1c levels in the past as his insurance did not approve NovoLog  and was forced to change to Humalog /Humulin.  Since then, we need to send a preauthorization for NovoLog  to his insurance every year.  At last  visit, I suggested: - Lantus  20 units 2x a day >> 50 units daily at night - NovoLog  >> Lyumjev  >> Humalog  in CeQur pump 150-175: 14 units (7 clicks) 175-200: 18 units (9 clicks) 200-230: 20 units (10 clicks) 230-and higher: 34 units (17 clicks) He cannot digest pills;  tried oral meds in the past >> "they went through me". He also tried Lantus  >> this did not work for him >> sugars unchanged despite increasing the dose.  He tried Tresiba  and also Lyumjev  but sugars were higher.  However, he was not able to start the CeQur simplicity insulin  pump as this was not covered by his insurance.    Pump: - OmniPod 5 - started 05/2023  CGM: - Dexcom G7  Insulin : - Humalog   Supplies: - [facility]  Insulin  pump settings: - Basal rates: 12 am: 1.55 units/h - Insulin  to carb ratio: 12 am: 1:10 - Target: 12 am: 120-120 - Correction factor (insulin  sensitivity factor):  12 am: 1:12 - Active insulin  time: 4h - Changes infusion site: q3 days  Total daily dose from basal insulin : 51% (34.5 units) Total daily dose from bolus insulin : 49% (32.8 units)  He checks her sugars more than 4 times a day with the CGM:  Previously:  Previously:  Lowest blood sugar: 42, 59 ...>> 60s  >> 60s >> . She has hypoglycemia awareness in the 60s. Highest sugars recently 400 (after breast sx)... >> 200s >> 300s.  -No CKD: Lab Results  Component Value Date  BUN 20 11/18/2023   Lab Results  Component Value Date   CREATININE 0.95 11/18/2023   Lab Results  Component Value Date   MICRALBCREAT 0.9 07/15/2017   MICRALBCREAT 0.5 08/12/2015  For now, off Lisinopril .  -+ HL; last set of lipids: 03/17/2022: 172/147/57/90 10/29/2021: 162/127/57/83 02/20/2020: 145/173/47/75 10/19/2019: 149/155/50/72 Lab Results  Component Value Date   CHOL 180 03/20/2019   HDL 48 03/20/2019   LDLCALC 97 03/20/2019   LDLDIRECT 119.0 07/15/2017   TRIG 173 (A) 03/20/2019   CHOLHDL 5 07/15/2017  02/03/2016:  205/284/55/93 07/02/2015: 206/229/63/97  07/23/2014: 324/868/42/171 AST/ALT: 121/76 He was taken off statins due to hemochromatosis >> now restarted Lipitor 10 mg daily. She had muscle aches on fenofibrate .  Currently on omega-3 fatty acids.  - last eye exam was in 2024: No DR reportedly [facility]).   -+ Numbness and tingling in his feet - and L hand numbness and pain.  He sees podiatry and neurology.  On Neurontin cream and Cymbalta - then restarted Cymbalta >> stopped again b/c ineffective. On Tramadol  for nerve pain. He saw pain clinic but cannot return until stops Lunesta.  Last foot exam was in 05/2023.  Vitamin D  def.:  Reviewed vitamin D  levels: 11/26/2022: vitamin D  94.9 - he changed ergocalciferol  to qod 03/17/2022: Vitamin D  51.6 11/18/2021 vitamin D  29.6 - ran out of ergocalciferol  10/29/2020: Vitamin D  55.9 02/20/2020: Vitamin D  47.9 Lab Results  Component Value Date   VD25OH 64.54 09/15/2018   VD25OH 31.25 07/15/2017   VD25OH 45.42 11/10/2016   VD25OH 35.30 05/31/2016   VD25OH 23.86 (L) 08/12/2015   VD25OH 19.32 (L) 11/28/2014  Vit D 23.5 in 02/03/2016 - on Ergocalciferol  50,000 IU daily. Vit D 13.9 in 06/2014 - despite being on Ergocalciferol  50,000 IU 2x a week. Vit D 19.3 in 11/2014 - repeat, on Ergocalciferol  50,000 IU 2x a week >> dose increased to 4 times a week Vit D 23.86 in 07/2015 - on ergocalciferol  50,000 IU 4x a week >> dose increased to daily   He continues on ergocalciferol  50,000 units, now every other day, previously daily  Latest TSH was normal: 10/29/2020: TSH 1.83 Lab Results  Component Value Date   TSH 0.762 07/23/2016   He continues on testosterone  for male to male transition.  He felt much better after starting this.  Had B mastectomy in 06/2019.  This was an extensive surgery.  He also had hysterectomy in 02/2021.  He started to take testosterone  sq instead of im >> liver tests and testosterone  levels improved.  He also has hemochromatosis,  allergy-triggered asthma, HTN, OSA-on CPAP, transaminitis, GERD, depression.  He  is on Depo-Provera. Before our visit at the end of 2022, he eliminated starches (bread, rice, pasta) and diet sodas. He lost 31 lbs overall per scale at home. He has rosacea >> was on Bactrim for many years >> not helpful anymore >> on antibiotics per dermatology.  ROS: + See HPI  I reviewed pt's medications, allergies, PMH, social hx, family hx, and changes were documented in the history of present illness. Otherwise, unchanged from my initial visit note.  Past Medical History:  Diagnosis Date   Allergic rhinitis    Asperger's syndrome 06/26/2019   Asthma exacerbation 07/22/2016   Carpal tunnel syndrome, bilateral 06/27/2020   Colostomy in place [facility])    Diabetic gastropathy (HCC)    Diabetic neuropathy, type II diabetes mellitus (HCC)    Diabetic peripheral neuropathy (HCC) 07/15/2015   Encounter for postoperative care 03/12/2021  Essential hypertension, benign    Extrinsic asthma, unspecified    Family history of breast cancer gene mutation in first degree relative 05/29/2019   Food intolerance    Gender dysphoria 12/19/2018   Formatting of this note might be different from the original. Trans Male Assessment and Plan  Pronoun Use: he/him Preferred Name: JP Preferred Anatomical Terminology: scientific Legal Documentation Corrected: None  Hormone Start Date: Dec 13, 2018 Current Testosterone  Dose: 50 mg cypionate Mode of Delivery (SQ vs IM): IM Injection Schedule (Weekly?  Twice a week?): weekly  Recommended Monitoring S   Generalized osteoarthrosis, involving multiple sites    Headache    Heart murmur    History of colostomy    Hormone replacement therapy    HTN (hypertension), benign    Hyperlipidemia    IDDM (insulin  dependent diabetes mellitus) 06/16/2018   Inappropriate sinus tachycardia (HCC)    Incisional hernia with obstruction but no gangrene 07/22/2017   Insomnia, unspecified    Lumbar  spinal stenosis 07/15/2015   Morbid obesity with BMI of 45.0-49.9, adult (HCC)    Muscular dystrophy (HCC) 07/09/2021   Formatting of this note might be different from the original. diagnosed age 68 yrs   Neuropathy 02/27/2015   Other hyperlipidemia    Pityriasis rosea    Pure hypercholesterolemia    Pure hypercholesterolemia    Sleep apnea    Type 2 diabetes, controlled, with peripheral neuropathy (HCC)    Ulnar neuritis, left    Urinary retention with incomplete bladder emptying    Vitamin D  deficiency    Past Surgical History:  Procedure Laterality Date   ABDOMINAL HYSTERECTOMY     BREAST REDUCTION SURGERY  07/27/2003   CHOLECYSTECTOMY  07/26/1998   CYSTOSCOPY WITH INJECTION N/A 11/21/2023   Procedure: CYSTOSCOPY, WITH INJECTION OF BLADDER NECK OR BLADDER WALL;  Surgeon: Samson Croak, MD;  Location: [facility];  Service: Urology;  Laterality: N/A;  200 UNITS OF BOTOX    IR CATHETER TUBE CHANGE  08/31/2021   RHINOPLASTY  07/27/2003   SPINAL FUSION  07/26/2004   L5-S1 spinal fusion   History   Social History   Marital Status: Single    Spouse Name: N/A   Number of Children: 0   Years of Education: 14   Occupational History   Prev. EMS, now disabled   Social History Main Topics   Smoking status: Never Smoker    Smokeless tobacco: Not on file   Alcohol Use: No   Drug Use: No   Current Outpatient Medications on File Prior to Visit  Medication Sig Dispense Refill   Accu-Chek FastClix Lancets MISC USE TO CHECK BLOOD SUGAR 4 TIMES A DAY 300 each 3   AIMOVIG 70 MG/ML SOAJ 70 mg by Subconjunctival route every 28 (twenty-eight) days.     albuterol  (PROVENTIL ) (2.5 MG/3ML) 0.083% nebulizer solution Take 2.5 mg by nebulization every 6 (six) hours as needed for wheezing.      albuterol  (VENTOLIN  HFA) 108 (90 Base) MCG/ACT inhaler Inhale 1 puff into the lungs every 4 (four) hours as needed for wheezing.      atorvastatin (LIPITOR) 20 MG tablet Take 20 mg by mouth daily.      budesonide -formoterol (SYMBICORT) 80-4.5 MCG/ACT inhaler Inhale 2 puffs into the lungs daily.     Cholecalciferol (VITAMIN D -3) 125 MCG (5000 UT) TABS Take 50,000 Units by mouth daily. (Patient not taking: Reported on 11/14/2023) 30000 tablet 0   ciprofloxacin  (CIPRO ) 250 MG tablet Take 250 mg by  mouth 2 (two) times daily.     Continuous Glucose Sensor (FREESTYLE LIBRE 2 PLUS SENSOR) MISC Inject 1 Device into the skin every 14 (fourteen) days. 6 each 3   desloratadine (CLARINEX) 5 MG tablet Take 1 tablet by mouth daily.     doxycycline (VIBRAMYCIN) 100 MG capsule Take 100 mg by mouth daily.     DULoxetine (CYMBALTA) 30 MG capsule Take 30 mg by mouth 2 (two) times daily.     EPINEPHrine 0.3 mg/0.3 mL IJ SOAJ injection Inject 0.3 mg into the muscle as needed for anaphylaxis.     fluticasone (FLONASE) 50 MCG/ACT nasal spray Place 2 sprays into both nostrils 2 (two) times daily as needed for allergies.     Glucagon  (BAQSIMI  ONE PACK) 3 MG/DOSE POWD Place 3 mg into the nose as needed. 1 each 1   glucose blood (ACCU-CHEK GUIDE TEST) test strip USE FOUR TIMES DAILY AS INSTRUCTED 300 strip 3   injection device for insulin  (CEQUR SIMPLICITY 2U) DEVI 1 each by Other route every 3 (three) days. 10 each 11   Injection Device for Insulin  (CEQUR SIMPLICITY INSERTER) MISC Use as advised 1 each 0   insulin  glargine (LANTUS  SOLOSTAR) 100 UNIT/ML Solostar Pen ADMINISTER 24 TO 26 UNITS UNDER THE SKIN TWICE DAILY (Patient not taking: Reported on 11/14/2023) 30 mL 3   insulin  lispro (HUMALOG ) 100 UNIT/ML injection Inject 0.7-0.9 mLs (70-90 Units total) into the skin daily. 30 mL 11   Insulin  Pen Needle (B-D ULTRAFINE III SHORT PEN) 31G X 8 MM MISC USE 8 TIMES DAILY AS DIRECTED 500 each 3   ipratropium (ATROVENT ) 0.03 % nasal spray Place 2 sprays into both nostrils daily.     LINZESS 145 MCG CAPS capsule Take 145 mcg by mouth daily as needed (constipation).     Multiple Vitamin (MULTIVITAMIN) tablet Take 1 tablet by mouth  daily.     MYRBETRIQ 50 MG TB24 tablet Take 50 mg by mouth daily.     Olopatadine HCl 0.2 % SOLN Place 1 drop into both eyes 2 (two) times daily as needed (allergies).     omega-3 acid ethyl esters (LOVAZA) 1 g capsule Take 2 capsules by mouth 2 (two) times daily.     PULMICORT  0.5 MG/2ML nebulizer solution Take 2 mLs by nebulization daily as needed for wheezing.     QUVIVIQ 25 MG TABS Take 25 mg by mouth at bedtime.     rizatriptan (MAXALT-MLT) 10 MG disintegrating tablet Take 10 mg by mouth as needed for headache or migraine.     testosterone  cypionate (DEPOTESTOSTERONE CYPIONATE) 200 MG/ML injection Inject 100 mg into the muscle once a week.     tiZANidine  (ZANAFLEX ) 4 MG tablet Take 4 mg by mouth every 8 (eight) hours as needed for muscle spasms.     torsemide (DEMADEX) 20 MG tablet Take 40 mg by mouth 2 (two) times daily.     traMADol  (ULTRAM ) 50 MG tablet Take 50-100 mg by mouth every 8 (eight) hours as needed for severe pain. 100 mg every night at bedtime     Vitamin D , Ergocalciferol , (DRISDOL ) 1.25 MG (50000 UNIT) CAPS capsule Take 1 capsule (50,000 Units total) by mouth every other day. 45 capsule 3   No current facility-administered medications on file prior to visit.   Allergies  Allergen Reactions   Fenofibrate  Other (See Comments)    Severe leg pain   Morphine And Codeine Rash   Family History  Problem Relation Age of Onset  Diabetes Mother    Hypertension Mother    Hyperlipidemia Mother    Hypertension Father    Hyperlipidemia Father    Diabetes Maternal Grandmother    Cancer Maternal Grandmother        Breast Cancer   Hypertension Maternal Grandfather    Heart disease Maternal Grandfather    Heart disease Paternal Grandfather    Cancer Other        Ovarian Cancer-Grandparent   PE: BP 120/70   Pulse 80   Ht 5\' 7"  (1.702 m)   Wt 285 lb 12.8 oz (129.6 kg)   SpO2 96%   BMI 44.76 kg/m   Wt Readings from Last 20 Encounters:  11/29/23 285 lb 12.8 oz (129.6 kg)   11/21/23 268 lb (121.6 kg)  11/18/23 268 lb (121.6 kg)  06/02/23 271 lb 3.2 oz (123 kg)  11/30/22 268 lb 3.2 oz (121.7 kg)  06/01/22 (!) 311 lb 12.8 oz (141.4 kg)  12/01/21 (!) 333 lb (151 kg)  08/31/21 (!) 332 lb (150.6 kg)  08/20/21 (!) 343 lb (155.6 kg)  07/24/21 (!) 333 lb (151 kg)  05/18/21 (!) 334 lb 12.8 oz (151.9 kg)  11/14/20 (!) 346 lb 12.8 oz (157.3 kg)  05/16/20 (!) 340 lb 12.8 oz (154.6 kg)  09/15/18 (!) 314 lb (142.4 kg)  11/24/17 (!) 304 lb 9.6 oz (138.2 kg)  07/15/17 287 lb (130.2 kg)  11/10/16 (!) 312 lb 3.2 oz (141.6 kg)  07/22/16 (!) 314 lb 14.4 oz (142.8 kg)  05/31/16 (!) 321 lb (145.6 kg)  09/25/15 (!) 313 lb (142 kg)    Constitutional: overweight, in NAD Eyes:  EOMI, no exophthalmos ENT: no neck masses, no cervical lymphadenopathy Cardiovascular: RRR, No MRG, + B arm swelling after lymphectomy after  mastectomy. Respiratory: CTA B Musculoskeletal: no deformities Skin:no rashes Neurological: no tremor with outstretched hands  ASSESSMENT: 1. DM2, insulin -dependent, uncontrolled, with complications - PN  - Patient had a lot of problems controlling his diabetes over the years due to unpredictability of retaining the food he ate in the setting of autonomic GI neuropathy.    Humalog  did not work for him >> sugars >200 constantly. Since he cannot use Humalog , we sent and then re-sent a PA for NovoLog . NovoLog  was finally approved, but this form has to be sent every year!  2. Vit D def  PLAN:  1. Patient with improved control in the last several years, especially after starting a CGM (nadir HbA1c 5.7%) and also improving his diet.  At last visit, HbA1c level was higher, at 7.4%.  Sugars appears to be increasing when he was starting eating, particularly after lunch and significantly after dinner, with most of the blood sugars before bedtime being quite high, even up to 300s or higher.  Sugars were improving afterwards.  He felt that Lyumjev  was not working well  for him and wanted to switch to Humalog .  We did this and I also recommended the CeQur simplicity pump.  This was not covered by his insurance but he was able to start the OmniPod 5 integrated with the Dexcom CGM.  HbA1c improved to 6.4% on 11/18/2023. CGM interpretation: -At today's visit, we reviewed his CGM downloads: It appears that 56% of values are in target range (goal >70%), while 44% are higher than 180 (goal <25%), and 0% are lower than 70 (goal <4%).  The calculated average blood sugar is 183.  The projected HbA1c for the next 3 months (GMI) is 7.7%. -Reviewing the CGM  trends, sugars appear to be fluctuating in the upper half of the target range during the day, but they increase after lunch and more consistently after dinner.  He is sometimes not entering all of the carbs into the pump before meals and I advised him to do so.  We also discussed about strengthening his insulin  to carb ratio with dinner for now, and, if sugars overnight remain elevated, he may need to lower his target from 120 to 110 milligrams per deciliter.  He is in the manual mode all the time as he saw high blood sugars in the 300s when he tried the auto mode before.  We discussed that this could have been an error, but advised him to try again to switch to auto mode for now. - I suggested to:  Patient Instructions  Please use the following pump settings: - Basal rates: 12 am: 1.55 units/h - Insulin  to carb ratio: 12 am: 1:10 5 pm: 1:10 >> 1:8 - Target: 12 am: 120-120 (if sugars remain elevated, can decrease the target to 110) - Correction factor (insulin  sensitivity factor):  12 am: 1:12 - Active insulin  time: 4h  Try to change to auto mode.  Please send me your new blood work.  Please return in 6 months.      - advised to check sugars at different times of the day - 4x a day, rotating check times - advised for yearly eye exams >> he is UTD - he needs an ACR and lipid panel but he declines this at today's  visit as he has pending labs by PCP - return to clinic in 4-6 months  2. HL - Reviewed latest lipid panel from 02/2022: All fractions at goal -He continues on Lipitor 10 mg daily and omega-3 fatty acids and continues to work on his diet.  Of note, he tried fenofibrate  in the past but he had to stop due to side effects. - he would want to wait until his next visit with PCP to have this checked  3. Vit D def -He was previously on high-dose vitamin D  50,000 units daily, but this was switched to every other day after vitamin D  level returned close to the upper limit of the normal range in 11/2021: - I usually refill his ergocalciferol  prescription -this was done at last visit - he has a vitamin D  level that will be checked by PCP later this month.  He feels that his vitamin D  may be low again as he does have bone pain.  Emilie Harden, MD PhD [facility]

## 2023-11-29 NOTE — Patient Instructions (Addendum)
 Please use the following pump settings: - Basal rates: 12 am: 1.55 units/h - Insulin  to carb ratio: 12 am: 1:10 5 pm: 1:10 >> 1:8 - Target: 12 am: 120-120 (if sugars remain elevated, can decrease the target to 110) - Correction factor (insulin  sensitivity factor):  12 am: 1:12 - Active insulin  time: 4h  Try to change to auto mode.  Please send me your new blood work.  Please return in 6 months.

## 2023-11-30 ENCOUNTER — Ambulatory Visit: Payer: 59 | Admitting: Internal Medicine

## 2023-12-01 ENCOUNTER — Encounter: Payer: Self-pay | Admitting: Internal Medicine

## 2024-01-19 ENCOUNTER — Other Ambulatory Visit: Payer: Self-pay | Admitting: Urology

## 2024-01-31 DIAGNOSIS — Z0289 Encounter for other administrative examinations: Secondary | ICD-10-CM

## 2024-02-23 ENCOUNTER — Encounter (INDEPENDENT_AMBULATORY_CARE_PROVIDER_SITE_OTHER): Payer: Self-pay | Admitting: Family Medicine

## 2024-02-23 ENCOUNTER — Ambulatory Visit (INDEPENDENT_AMBULATORY_CARE_PROVIDER_SITE_OTHER): Admitting: Family Medicine

## 2024-02-23 VITALS — BP 114/77 | HR 79 | Temp 98.0°F | Ht 67.0 in | Wt 284.0 lb

## 2024-02-23 DIAGNOSIS — G4733 Obstructive sleep apnea (adult) (pediatric): Secondary | ICD-10-CM

## 2024-02-23 DIAGNOSIS — R0602 Shortness of breath: Secondary | ICD-10-CM | POA: Diagnosis not present

## 2024-02-23 DIAGNOSIS — Z6841 Body Mass Index (BMI) 40.0 and over, adult: Secondary | ICD-10-CM

## 2024-02-23 DIAGNOSIS — E1159 Type 2 diabetes mellitus with other circulatory complications: Secondary | ICD-10-CM | POA: Diagnosis not present

## 2024-02-23 DIAGNOSIS — E785 Hyperlipidemia, unspecified: Secondary | ICD-10-CM | POA: Diagnosis not present

## 2024-02-23 DIAGNOSIS — Z794 Long term (current) use of insulin: Secondary | ICD-10-CM

## 2024-02-23 DIAGNOSIS — E1169 Type 2 diabetes mellitus with other specified complication: Secondary | ICD-10-CM | POA: Diagnosis not present

## 2024-02-23 DIAGNOSIS — Z1331 Encounter for screening for depression: Secondary | ICD-10-CM

## 2024-02-23 DIAGNOSIS — I152 Hypertension secondary to endocrine disorders: Secondary | ICD-10-CM

## 2024-02-23 DIAGNOSIS — R5383 Other fatigue: Secondary | ICD-10-CM

## 2024-02-23 DIAGNOSIS — G7100 Muscular dystrophy, unspecified: Secondary | ICD-10-CM

## 2024-02-23 NOTE — Progress Notes (Signed)
 Gavin Smith, D.O.  AB[facility], [facility] Specializing in Clinical Bariatric Medicine Office located at: [address]  [place], [place]  [address]     Bariatric Medicine Visit  Dear Gavin Smith., MD   Thank you for referring Gavin Smith to our clinic today for evaluation.  We performed a consultation to discuss his options for treatment and educate the patient on his disease state.  The following note includes my evaluation and treatment recommendations.   Please do not hesitate to reach out to me directly if you have any further concerns.    Assessment and Plan:    Labs obtained today (CBC, CMP w GFR, folate, A1c, insulin , lipid panel, T3, free T4, TSH, vit B12, and vit D) will be reviewed at next OV.    FOR THE DISEASE OF OBESITY: Morbid obesity with BMI of 45.0-49.9, adult (HCC) current 44.48 Starting Morbid obesity (HCC) 44.48 Assessment & Plan: Gavin Smith is currently in the action stage of change. As such, his goal is to start our weight management plan.  He has agreed to implement: START implementation of CAT 2 MP w B/L options -- 1200-1400 calories and 90+ Gavin Smith of protein per day.    Behavioral Intervention We discussed the following Behavioral Modification Strategies today:  begin to work on implementation of reduced calorie nutritional plan, getting the recommended amount of protein, incorporating whole foods, making healthy choices, staying well hydrated and practicing mindfulness when eating.  Additional resources provided today: Handout on CAT 2 meal plan, Handout on CAT 1-2 breakfast options, and Handout on CAT 1-2 lunch options  Evidence-based interventions for health behavior change were utilized today including the discussion of self monitoring techniques, problem-solving barriers and SMART goal setting techniques.    Pt will specifically work on: Implementation of meal plan!    Recommended Physical Activity Goals Gavin Smith has been advised to work up  to 150 minutes of moderate intensity aerobic activity a week and strengthening exercises 2-3 times per week for cardiovascular health, weight loss maintenance and preservation of muscle mass.   He has agreed to: Maintain current level of activity.    Pharmacotherapy We discussed various medication options to help Gavin Smith with his weight loss efforts and we both agreed to: START nutritional and behavioral strategies   ASSOCIATED CONDITIONS ADDRESSED TODAY:  Fatigue Assessment & Plan: Gavin Smith does feel that his weight is causing his energy to be lower than it should be. Fatigue may be related to obesity, depression or many other causes. he does not appear to have any red flag symptoms and this appears to most likely be related to his current lifestyle habits and dietary intake.  Labs will be ordered and reviewed with him at their next office visit in two weeks.   OSA (obstructive sleep apnea) Epworth sleepiness scale is 3 and appears to be within normal limits. Gavin Smith reports some daytime somnolence and admits to waking up still tired. Patient has a history of symptoms of daytime fatigue, morning fatigue, and hypertension. Gavin Smith generally gets 3-4 hours of sleep per night, and states that he has difficulty falling asleep. Pt is uncertain if snoring is present. Apneic episodes is present.   Using CPAP nightly. Has hx of chronic insomnia, his whole life. Pt is in the process of looking for a new sleep medicine specialist. Discussed option of sleep specialists with pulmonologist, neurologist, or cardiologist. Encouraged pt to discuss with PCP about a referral to sleep medicine.    ECG: Performed on 11/18/2023;  was personally reviewed today. Normal sinus rhythm, rate 78 bpm; reassuring without any acute abnormalities. Will continue to monitor for symptoms   Shortness of breath on exertion Assessment & Plan: Gavin Smith does feel that he gets out of breath more easily than he used to when he  exercises and seems to be worsening over time with weight gain.  This has not gotten worse recently. Gavin Smith denies shortness of breath at rest or orthopnea. Pt denies chest pain, dizziness, heart palpitations, or excessive diaphoresis or nausea with activity.  This is not new and is ongoing.  Gavin Smith's shortness of breath appears to be obesity related and exercise induced, as they do not appear to have any red flag symptoms/ concerns today.  Also, this condition appears to be related to a state of poor cardiovascular conditioning   Obtain labs today and will be reviewed with him at their next office visit in two weeks.  Indirect Calorimeter completed today to help guide our dietary regimen. It shows a VO2 of 264 and a REE of 1814.  His calculated basal metabolic rate is 7619 thus his resting energy expenditure is worse than expected.  Patient agreed to work on weight loss at this time.  As Gavin Smith progresses through our weight loss program, we will gradually increase exercise as tolerated to treat his current condition.   If Gavin Smith follows our recommendations and loses 5-10% of their weight without improvement of his shortness of breath or if at any time, symptoms become more concerning, they agree to urgently follow up with their PCP/ specialist for further consideration/ evaluation.   Gavin Smith verbalizes agreement with this plan.   Depression Screen  Assessment & Plan: His PHQ-9 score was 5. Moods are stable. Pt denies any emotional eating. Denies any SI/HI.   Per New Patient Packet:  - Moods affect his eating more than normal -- at least half the time - Extra wt decreases his interest in doing things he used to enjoy -- occasionally  - Weight problem make shim feel depressed, down, or even hopeless -- occasionally.   Advised pt on the importance of following their new nutritional meal plan prescribed today and how food can affect moods in addition to emotional eating. Counseling on the  importance of stress management, exercise, and self-care activities like meditation and adequate sleep (7-9 hrs/night). Stressed the importance of following up with their PCP.    Type 2 diabetes mellitus with other specified complication, with long-term current use of insulin  Gavin Smith) Assessment & Plan: Diagnosed at 48 y/o. He was first put on oral medication, but oral meds were not being properly digested. He states his medication would appear in his stool as a full tablet. Per pt, his glucose tolerance test showed he was a 1.5 diabetic, which is rare. He has been on insulin  since 48 y/o. Currently established with Dr. Trixie of endocrinology.   Continue current medication regimen as prescribed. Continue to follow up with endocrinology and PCP as instructed by them. Follow low-carb and high protein meal plan prescribed today. Increasing protein intake may improve glycemic control. Will check A1c, insulin , CBC, and CMP w GFR, folate, T3, free T4, TSH, vit B12, and vit D today. Will review results at next OV.    Hyperlipidemia associated with type 2 diabetes mellitus Gavin Smith) Assessment & Plan: Lab Results  Component Value Date   CHOL 180 03/20/2019   HDL 48 03/20/2019   LDLCALC 97 03/20/2019   LDLDIRECT 119.0 07/15/2017   TRIG 173 (A) 03/20/2019  CHOLHDL 5 07/15/2017   Diagnosed and started medication at 48 y/o. Started meds shortly after dx. Has strong fmhx of early CAD; all four grandparents had CAD that contributed to their passing. He adds his grandfather passed from an MI at 70. Currently taking Lipitor 20 mg once daily and Lovaza 2 Gavin Smith BID. His visceral fat rating is 26.   Ideal visceral fat rating of <10 reviewed with patient. Discussed how above goal visceral fat ratings and uncontrolled visceral obesity increases the risk of increased adipose tissue around vital organs and increased risk of CAD and MIs. Continue current med regimen as prescribed. Decrease simple carbs, added sugars, and  consumption of pre-packaged meals. Increase lean protein intake. Reduce fatty meats and saturated/trans fats. Will check lipid panel today. Will review results at next OV.   Hypertension associated with diabetes Gavin Smith) Assessment & Plan: BP Readings from Last 3 Encounters:  02/23/24 114/77  11/29/23 120/70  11/21/23 (!) 136/98   Currently diet and lifestyle controlled. No acute concerns today. BP is expected to decrease as he loses weight. Focus on heart-healthy, low-salt, high protein diet per his newly prescribed nutritional meal plan. Will continue monitoring alongside PCP.    Muscular dystrophy Assessment & Plan: Pt has hx of muscular dystrophy with multiple neurogenic bladder and neurogenic intestines. He states food and oral medications are not properly digested and appear whole in stools. He also reports a hx of lactose intolerance   Continue current regimen. Reviewed importance of increasing lean protein to improve muscle mass. Will closely monitor patient's muscle mass as he progresses in the program. Continue following up with specialists/PCP as instructed by them.    FOLLOW UP:   Follow up in 2 weeks. He was informed of the importance of frequent follow up visits to maximize his success with intensive lifestyle modifications for his multiple health conditions.  BRANDYN LOWREY is aware that we will review all of his lab results at our next visit.  He is aware that if anything is critical/ life threatening with the results, we will be contacting him via MyChart prior to the office visit to discuss management.    Chief Complaint:   OBESITY Gavin Smith (MR# [id number]) is a pleasant 48 y.o. adult who presents for evaluation and treatment of obesity and related comorbidities. Current BMI is Body mass index is 44.48 kg/m. Gavin Smith has been struggling with his weight for many years and has been unsuccessful in either losing weight, maintaining weight loss, or reaching his  healthy weight goal.  Gavin Smith is currently in the action stage of change and ready to dedicate time achieving and maintaining a healthier weight. Gavin Smith is interested in becoming our patient and working on intensive lifestyle modifications including (but not limited to) diet and exercise for weight loss.  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    No formal exercise, but does yard work daily for about 1 hour.   Wears a fitness tracker and gets 6,900 steps per day.  Desired weight: 200 lbs.  Reason for wanting to lose weight: more clothing options .  Peak weight: 376 lb.  What diet plan has worked best:  Eating healthy.  Cravings: none.  Snacks on fruit and cheese.  States eating healthy is financially difficult.  Skips breakfast most days due to nausea in the mornings.   Only drinks water, no caloric beverages.   Uncertain about using sugar substitutes.  Worst food habits: not eating enough, eating the same foods over and over again  Pt is allergic to all types of fish.   Subjective:   This is the patient's first visit at [facility].  The patient's NEW PATIENT PACKET that they filled out prior to today's office visit was reviewed at length and information from that paperwork was included within the following office visit note.    Included in the packet: current and past health history, medications, allergies, ROS, gynecologic history (women only), surgical history, family history, social history, weight history, weight loss surgery history (for those that have had weight loss surgery), nutritional evaluation, mood and food questionnaire along with a depression screening (PHQ9) on all patients, an Epworth questionnaire, sleep habits questionnaire, patient life and health improvement goals questionnaire. These will all be scanned into the patient's chart under the media tab.   Review of Systems: Please refer  to new patient packet scanned into media. Pertinent positives were addressed with patient today.  Reviewed by clinician on day of visit: allergies, medications, problem list, medical history, surgical history, family history, social history, and previous encounter notes.  During the visit, I independently reviewed the patient's EKG, bioimpedance scale results, and indirect calorimeter results. I used this information to tailor a meal plan for the patient that will help Gavin Smith to lose weight and will improve his obesity-related conditions going forward.  I performed a medically necessary appropriate examination and/or evaluation. I discussed the assessment and treatment plan with the patient. The patient was provided an opportunity to ask questions and all were answered. The patient agreed with the plan and demonstrated an understanding of the instructions. Labs were ordered today (unless patient declined them) and will be reviewed with the patient at our next visit unless more critical results need to be addressed immediately. Clinical information was updated and documented in the EMR.    Objective:   PHYSICAL EXAM: Blood pressure 114/77, pulse 79, temperature 98 F (36.7 C), height 5' 7 (1.702 m), weight 284 lb (128.8 kg), SpO2 100%. Body mass index is 44.48 kg/m.  General: Well Developed, well nourished, and in no acute distress.  HEENT: Normocephalic, atraumatic; EOMI, sclerae are anicteric. Skin: Warm and dry, good turgor Chest:  Normal excursion, shape, no gross ABN Respiratory: No conversational dyspnea; speaking in full sentences NeuroM-Sk:  Normal gross ROM * 4 extremities  Psych: A and O *3, insight adequate, mood- full   Anthropometric Measurements Height: 5' 7 (1.702 m) Weight: 284 lb (128.8 kg) BMI (Calculated): 44.47 Weight at Last Visit: N/a Weight Lost Since Last Visit: N/a Weight Gained Since Last Visit: N/a Starting Weight: 284lb Total Weight Loss (lbs): 0 lb  (0 kg) Peak Weight: 376 Waist Measurement : 51 inches   Body Composition  Body Fat %: 41.2 % Fat Mass (lbs): 117.2 lbs Muscle Mass (lbs): 159.2 lbs Total Body Water (lbs): 124.6 lbs Visceral Fat Rating : 26   Other Clinical Data RMR: 1814 Fasting: Yes Labs: Yes Today's Visit #: 1 Starting Date: 02/23/24 Comments: first visit    DIAGNOSTIC DATA REVIEWED:  BMET    Component Value Date/Time   NA 139 11/18/2023 1039   K 4.4 11/18/2023 1039   CL 98 11/18/2023 1039   CO2 29 11/18/2023 1039   GLUCOSE 114 (H) 11/18/2023 1039   BUN 20 11/18/2023 1039   CREATININE 0.95 11/18/2023 1039   CREATININE 0.81 07/15/2017 1126   CALCIUM 9.7 11/18/2023 1039   GFRNONAA >60  11/18/2023 1039   GFRNONAA 91 07/15/2017 1126   GFRAA 106 07/15/2017 1126   Lab Results  Component Value Date   HGBA1C 6.4 (H) 11/18/2023   HGBA1C 7.6 (H) 05/28/2011   No results found for: INSULIN  Lab Results  Component Value Date   TSH 0.762 07/23/2016   CBC    Component Value Date/Time   WBC 10.3 11/18/2023 1039   RBC 5.77 11/18/2023 1039   HGB 17.3 (H) 11/18/2023 1039   HCT 52.4 (H) 11/18/2023 1039   PLT 240 11/18/2023 1039   MCV 90.8 11/18/2023 1039   MCH 30.0 11/18/2023 1039   MCHC 33.0 11/18/2023 1039   RDW 12.7 11/18/2023 1039   Iron Studies No results found for: IRON, TIBC, FERRITIN, IRONPCTSAT Lipid Panel     Component Value Date/Time   CHOL 180 03/20/2019 0000   TRIG 173 (A) 03/20/2019 0000   HDL 48 03/20/2019 0000   CHOLHDL 5 07/15/2017 1126   VLDL 74.4 (H) 07/15/2017 1126   LDLCALC 97 03/20/2019 0000   LDLDIRECT 119.0 07/15/2017 1126   Hepatic Function Panel     Component Value Date/Time   PROT 6.6 07/15/2017 1126   ALBUMIN 3.9 07/23/2016 0402   AST 23 07/15/2017 1126   ALT 26 07/15/2017 1126   ALKPHOS 59 07/23/2016 0402   BILITOT 1.3 (H) 07/15/2017 1126      Component Value Date/Time   TSH 0.762 07/23/2016 0729   Nutritional Lab Results  Component Value  Date   VD25OH 64.54 09/15/2018   VD25OH 31.25 07/15/2017   VD25OH 45.42 11/10/2016    Attestation Statements:   I, Vernell Forest, acting as a medical scribe for Gavin Jenkins, DO., have compiled all relevant documentation for today's office visit on behalf of Gavin Jenkins, DO, while in the presence of [employer], DO.  I have spent 52 minutes in the care of the patient today including: 42 minutes face-to-face assessing and reviewing listed medical problems above as outlined in office visit note, providing nutritional and behavioral counseling as outlined in obesity care plan, independently interpreting results and goals of care, see listed medical problems, and discussing biometric information and progress.   I have reviewed the above documentation for accuracy and completeness, and I agree with the above. Gavin JINNY Smith, D.O.  The 21st Century Cures Act was signed into law in 2016 which includes the topic of electronic health records.  This provides immediate access to information in MyChart.  This includes consultation notes, operative notes, office notes, lab results and pathology reports.  If you have any questions about what you read please let us  know at your next visit so we can discuss your concerns and take corrective action if need be.  We are right here with you.

## 2024-02-24 LAB — FOLATE: Folate: 5.2 ng/mL (ref >3.0)

## 2024-02-24 LAB — COMPREHENSIVE METABOLIC PANEL WITH GFR
ALT: 31 IU/L (ref 0–44)
AST: 43 IU/L — ABNORMAL HIGH (ref 0–40)
Albumin: 4.4 g/dL (ref 4.1–5.1)
Alkaline Phosphatase: 84 IU/L (ref 44–121)
BUN/Creatinine Ratio: 16 (ref 9–20)
BUN: 16 mg/dL (ref 6–24)
Bilirubin Total: 2.9 mg/dL — ABNORMAL HIGH (ref 0.0–1.2)
CO2: 25 mmol/L (ref 20–29)
Calcium: 9.5 mg/dL (ref 8.7–10.2)
Chloride: 101 mmol/L (ref 96–106)
Creatinine, Ser: 0.97 mg/dL (ref 0.76–1.27)
Globulin, Total: 2.9 g/dL (ref 1.5–4.5)
Glucose: 118 mg/dL — ABNORMAL HIGH (ref 70–99)
Potassium: 4 mmol/L (ref 3.5–5.2)
Sodium: 141 mmol/L (ref 134–144)
Total Protein: 7.3 g/dL (ref 6.0–8.5)
eGFR: 98 mL/min/1.73 (ref >59)

## 2024-02-24 LAB — VITAMIN D 25 HYDROXY (VIT D DEFICIENCY, FRACTURES): Vit D, 25-Hydroxy: 24.8 ng/mL — ABNORMAL LOW (ref 30.0–100.0)

## 2024-02-24 LAB — CBC WITH DIFFERENTIAL/PLATELET
Basophils Absolute: 0 x10E3/uL (ref 0.0–0.2)
Basos: 1 %
EOS (ABSOLUTE): 0.2 x10E3/uL (ref 0.0–0.4)
Eos: 3 %
Hematocrit: 50.6 % (ref 37.5–51.0)
Hemoglobin: 16.7 g/dL (ref 13.0–17.7)
Immature Grans (Abs): 0 x10E3/uL (ref 0.0–0.1)
Immature Granulocytes: 0 %
Lymphocytes Absolute: 2.2 x10E3/uL (ref 0.7–3.1)
Lymphs: 35 %
MCH: 30.6 pg (ref 26.6–33.0)
MCHC: 33 g/dL (ref 31.5–35.7)
MCV: 93 fL (ref 79–97)
Monocytes Absolute: 0.4 x10E3/uL (ref 0.1–0.9)
Monocytes: 6 %
Neutrophils Absolute: 3.4 x10E3/uL (ref 1.4–7.0)
Neutrophils: 55 %
Platelets: 211 x10E3/uL (ref 150–450)
RBC: 5.46 x10E6/uL (ref 4.14–5.80)
RDW: 12.3 % (ref 11.6–15.4)
WBC: 6.2 x10E3/uL (ref 3.4–10.8)

## 2024-02-24 LAB — LIPID PANEL
Chol/HDL Ratio: 3.4 ratio (ref 0.0–5.0)
Cholesterol, Total: 158 mg/dL (ref 100–199)
HDL: 47 mg/dL (ref >39)
LDL Chol Calc (NIH): 72 mg/dL (ref 0–99)
Triglycerides: 237 mg/dL — ABNORMAL HIGH (ref 0–149)
VLDL Cholesterol Cal: 39 mg/dL (ref 5–40)

## 2024-02-24 LAB — TSH: TSH: 1.38 u[IU]/mL (ref 0.450–4.500)

## 2024-02-24 LAB — VITAMIN B12: Vitamin B-12: 551 pg/mL (ref 232–1245)

## 2024-02-24 LAB — INSULIN, RANDOM: INSULIN: 2.7 u[IU]/mL (ref 2.6–24.9)

## 2024-02-24 LAB — HEMOGLOBIN A1C
Est. average glucose Bld gHb Est-mCnc: 126 mg/dL
Hgb A1c MFr Bld: 6 % — ABNORMAL HIGH (ref 4.8–5.6)

## 2024-02-24 LAB — T4, FREE: Free T4: 0.98 ng/dL (ref 0.82–1.77)

## 2024-02-24 LAB — T3: T3, Total: 132 ng/dL (ref 71–180)

## 2024-03-07 ENCOUNTER — Other Ambulatory Visit: Payer: Self-pay | Admitting: Medical Genetics

## 2024-03-08 ENCOUNTER — Ambulatory Visit (INDEPENDENT_AMBULATORY_CARE_PROVIDER_SITE_OTHER): Payer: Self-pay | Admitting: Family Medicine

## 2024-03-08 ENCOUNTER — Encounter (INDEPENDENT_AMBULATORY_CARE_PROVIDER_SITE_OTHER): Payer: Self-pay | Admitting: Family Medicine

## 2024-03-08 VITALS — BP 105/62 | HR 79 | Temp 98.3°F | Ht 67.0 in | Wt 280.0 lb

## 2024-03-08 DIAGNOSIS — E785 Hyperlipidemia, unspecified: Secondary | ICD-10-CM

## 2024-03-08 DIAGNOSIS — I152 Hypertension secondary to endocrine disorders: Secondary | ICD-10-CM

## 2024-03-08 DIAGNOSIS — Z794 Long term (current) use of insulin: Secondary | ICD-10-CM

## 2024-03-08 DIAGNOSIS — E1169 Type 2 diabetes mellitus with other specified complication: Secondary | ICD-10-CM

## 2024-03-08 DIAGNOSIS — Z6841 Body Mass Index (BMI) 40.0 and over, adult: Secondary | ICD-10-CM

## 2024-03-08 DIAGNOSIS — E1159 Type 2 diabetes mellitus with other circulatory complications: Secondary | ICD-10-CM

## 2024-03-08 DIAGNOSIS — E559 Vitamin D deficiency, unspecified: Secondary | ICD-10-CM

## 2024-03-08 NOTE — Progress Notes (Signed)
 Gavin Smith, D.O.  ABFM, ABOM Clinical Bariatric Medicine Physician  Office located at: [address] [place], [place]  [address]    Assessment and Plan:    FOR THE DISEASE OF OBESITY:  Morbid obesity with BMI of 45.0-49.9, adult [facility]) current Starting Morbid obesity (HCC) 44.47 Assessment & Plan: Since last office visit on 02/23/24 patient's muscle mass has decreased by 1.8 lbs. Fat mass has decreased by 2.2 lbs. Total body water has increased by 1.4 lbs.  Counseling done on how various foods will affect these numbers and how to maximize success  Total lbs lost to date: 4 lbs Total weight loss percentage to date: -1.41 %   Recommended Dietary Goals Armoni is currently in the action stage of change. As such, his goal is to continue weight management plan.  He has agreed to: continue current plan   Behavioral Intervention We discussed the following today: increasing lean protein intake to established goals, decreasing simple carbohydrates , increasing vegetables, increasing lower glycemic fruits, avoiding skipping meals, work on meal planning and preparation, and identifying sources and decreasing liquid calories.  Encouraged  pt to eat protein with real food sources not supplements, reviewed options of high protein sources.   Pt is currently on hormone therapy. Reviewed how testosterone may elevate hemacrit and creatinine levels.    Additional resources provided today: None  Evidence-based interventions for health behavior change were utilized today including the discussion of self monitoring techniques, problem-solving barriers and SMART goal setting techniques.  Regarding patient's less desirable eating habits and patterns, we employed the technique of small changes.   Pt will specifically work on: Eat more real food &  only drink 1 protein shake per day (under 200 calories)   Recommended Physical Activity Goals Herbert has been advised to work up to 300-450  minutes of moderate intensity aerobic activity a week and strengthening exercises 2-3 times per week for cardiovascular health, weight loss maintenance and preservation of muscle mass.   He has agreed to : Increase physical activity in their day and reduce sedentary time (increase NEAT).   Pharmacotherapy We discussed various medication options to help Trevonn with his weight loss efforts and we both agreed to: Continue with current nutritional and behavioral strategies   ASSOCIATED CONDITIONS ADDRESSED TODAY:  Type 2 diabetes mellitus with other specified complication, with long-term current use of insulin [facility]) Assessment & Plan: Lab Results  Component Value Date   HGBA1C 6.0 (H) 02/23/2024   HGBA1C 6.4 (H) 11/18/2023   HGBA1C 7.4 (A) 06/02/2023   INSULIN 2.7 02/23/2024    Reviewed labs obtained last OV: A1c improved to 6.0 from 6.4. Insulin at goal.  Has a continuous glucose monitor, reporting readings are within normal range. Currently on Humalog 70-90 units daily with good compliance and tolerance. No adverse SE reported. Has been on injectables in the past but discontinued d/t intolerances -- elevated blood sugars, lethargy, and body could not process it . Also took Byetta several years ago.   Ideal fasting BGs readings reviewed with pt. Continue working on healthy diet per meal plan by increasing protein intake, decreasing simple carbs and added sugars, and properly hydrating. Drink 1/2 his body weight in ounces of water per day. Reviewed role of protein as it relates to diabetes and weight loss; helps with better glycemic control, improving hunger/cravings, and improves metabolism. Continue medication regimen and f/u with specialist/PCP as instructed by them. Will continue monitoring.     Hyperlipidemia associated with type  2 diabetes mellitus [facility]) Assessment & Plan: Lab Results  Component Value Date   CHOL 158 02/23/2024   HDL 47 02/23/2024   LDLCALC 72 02/23/2024    LDLDIRECT 119.0 07/15/2017   TRIG 237 (H) 02/23/2024   CHOLHDL 3.4 02/23/2024   Reviewed labs obtained last OV: TGs are elevated at 237. LDL at 72. Pt states he has had elevated TGs since 48 y/o. Currently on Lipitor 20 mg once daily and Lovaza 2 g BID. Good compliance/tolerance with no adverse SE reported. He tends to avoid pork and fatty meats. VFR went down from 26 to 25 since LOV. Of note, his Bilirubin and AST elevated today at 2.9 and 43 respectively.   As a diabetic, LDL should be less than 70. HDL goal of 60 or above. TG goal of <150. Emphasized lifestyle interventions including best efforts with following heart healthy diet and increasing exercise. Continue working on decreasing saturated and trans fats, decrease simple carbs, and increase protein. Continue with current medication regimen as prescribed. Will continue monitoring condition.    Hypertension associated with diabetes [facility]) Assessment & Plan: BP Readings from Last 3 Encounters:  03/08/24 105/62  02/23/24 114/77  11/29/23 120/70   Managed with diet/lifestyle; not currently on pharmacologic treatment. BP is at low normal today, below what he reports is his baseline. Pt is asx and denies any hypotensive sx today.   BP is expected to decrease as he loses weight. Continue working on heart healthy, low salt, high protein diet per his meal plan. Will continue monitoring alongside PCP.     Vitamin D deficiency Assessment & Plan: Lab Results  Component Value Date   VD25OH 24.8 (L) 02/23/2024   VD25OH 64.54 09/15/2018   VD25OH 31.25 07/15/2017   Reviewed labs obtained at LOV: Vit D is suboptimal at 24.8. Also reviewed B12 level, at goal without current supplementation. Folate at goal. Pt has been taking ERGO 50K units every other day prescribed by his endocrinologist. . Metta compliance/tolerance with no SE. Vit D related to hunger and cravings pathways, mood, energy levels, and joint pain.   Ideal vit D range of 50-70  reviewed with pt. Discussed vit D being a lipophilic (fat-soluble) vitamin and vit D levels are expected to improve as he loses fat mass. Continue current supplementation regimen as prescribed. Will continue monitoring condition alongside endocrinologist/PCP.     FOLLOW UP:   Return in about 4 weeks (around 04/05/2024) for f/u on 04/04/2024 at 2:20 PM. Make another appt if pt desires. He was informed of the importance of frequent follow up visits to maximize his success with intensive lifestyle modifications for his multiple health conditions.  Subjective:   Chief complaint: Obesity Tait is here to discuss his progress with his obesity treatment plan. He is on the Category 2 Plan w B/L options and journaling 1200-1400 calories and 90+ g of protein per day and states he is following his eating plan approximately 99% of the time. He states he is not exercising.   Interval History:  CHRISANGEL ESKENAZI is here today for his first follow-up office visit since starting the program with us . Patient is off to a good start and has lost 4 lbs. He reports craving bacon on Tuesday, which he typically only eats once a year, stating this was the only time he ate unhealthy since starting the program. He made sure to journal, even when eating off-plan and reports the slices of bacon totaled to about 910 calories. Has been eating [address]  Crunch cereal which he pre-measured into smaller baggies for convenience. He struggles to eat breakfast and often has to supplement with a protein shake or protein bar. If busy, he has tried to rush and eat as much as he could before having to leave home. It was difficult to eat the entire amount of food recommended at each meal and although he is still not used to eating it all, he note she is now feeling more full and satisfied after meals. When eating high glycemic foods such as a banana, he has been weighing it to accurately journal it.   All blood work/ lab tests that were  recently ordered by myself or an outside provider were reviewed with patient today per their request. Extended time was spent counseling him on all new disease processes that were discovered or preexisting ones that are affected by BMI.  he understands that many of these abnormalities will need to monitored regularly along with the current treatment plan of prudent dietary changes, in which we are making each and every office visit, to improve these health parameters.  Pharmacotherapy for weight loss: He is not currently taking medications  for medical weight loss.    Review of Systems:  Pertinent positives were addressed with patient today. Reviewed by clinician on day of visit: allergies, medications, problem list, medical history, surgical history, family history, social history, and previous encounter notes.  Weight Summary and Biometrics   Weight Lost Since Last Visit: 4lb  Weight Gained Since Last Visit: 0   Vitals Temp: 98.3 F (36.8 C) BP: 105/62 Pulse Rate: 79 SpO2: 97 %   Anthropometric Measurements Height: 5' 7 (1.702 m) Weight: 280 lb (127 kg) BMI (Calculated): 43.84 Weight at Last Visit: 284lb Weight Lost Since Last Visit: 4lb Weight Gained Since Last Visit: 0 Starting Weight: 284lb Total Weight Loss (lbs): 4 lb (1.814 kg) Peak Weight: 376lb Waist Measurement : 51 inches   Body Composition  Body Fat %: 41 % Fat Mass (lbs): 115 lbs Muscle Mass (lbs): 157.2 lbs Total Body Water (lbs): 126 lbs Visceral Fat Rating : 25   Other Clinical Data Fasting: No Labs: No Today's Visit #: 2 Starting Date: 02/23/24     Objective:   PHYSICAL EXAM:  Blood pressure 105/62, pulse 79, temperature 98.3 F (36.8 C), height 5' 7 (1.702 m), weight 280 lb (127 kg), SpO2 97%. Body mass index is 43.85 kg/m.  General: he is overweight, cooperative and in no acute distress.   HEENT: EOMI, sclerae are anicteric. Lungs: Normal breathing effort, no conversational  dyspnea. M-Sk:  Normal gross ROM * 4 extremities  PSYCH: Has normal mood, affect and thought process. Neurologic: No gross sensory or motor deficits. Well developed, A and O * 3  DIAGNOSTIC DATA REVIEWED:  BMET    Component Value Date/Time   NA 141 02/23/2024 1223   K 4.0 02/23/2024 1223   CL 101 02/23/2024 1223   CO2 25 02/23/2024 1223   GLUCOSE 118 (H) 02/23/2024 1223   GLUCOSE 114 (H) 11/18/2023 1039   BUN 16 02/23/2024 1223   CREATININE 0.97 02/23/2024 1223   CREATININE 0.81 07/15/2017 1126   CALCIUM 9.5 02/23/2024 1223   GFRNONAA >60 11/18/2023 1039   GFRNONAA 91 07/15/2017 1126   GFRAA 106 07/15/2017 1126   Lab Results  Component Value Date   HGBA1C 6.0 (H) 02/23/2024   HGBA1C 7.6 (H) 05/28/2011   Lab Results  Component Value Date   INSULIN 2.7 02/23/2024   Lab Results  Component Value Date   TSH 1.380 02/23/2024   CBC    Component Value Date/Time   WBC 6.2 02/23/2024 1223   WBC 10.3 11/18/2023 1039   RBC 5.46 02/23/2024 1223   RBC 5.77 11/18/2023 1039   HGB 16.7 02/23/2024 1223   HCT 50.6 02/23/2024 1223   PLT 211 02/23/2024 1223   MCV 93 02/23/2024 1223   MCH 30.6 02/23/2024 1223   MCH 30.0 11/18/2023 1039   MCHC 33.0 02/23/2024 1223   MCHC 33.0 11/18/2023 1039   RDW 12.3 02/23/2024 1223   Iron Studies No results found for: IRON, TIBC, FERRITIN, IRONPCTSAT Lipid Panel     Component Value Date/Time   CHOL 158 02/23/2024 1223   TRIG 237 (H) 02/23/2024 1223   HDL 47 02/23/2024 1223   CHOLHDL 3.4 02/23/2024 1223   CHOLHDL 5 07/15/2017 1126   VLDL 74.4 (H) 07/15/2017 1126   LDLCALC 72 02/23/2024 1223   LDLDIRECT 119.0 07/15/2017 1126   Hepatic Function Panel     Component Value Date/Time   PROT 7.3 02/23/2024 1223   ALBUMIN 4.4 02/23/2024 1223   AST 43 (H) 02/23/2024 1223   ALT 31 02/23/2024 1223   ALKPHOS 84 02/23/2024 1223   BILITOT 2.9 (H) 02/23/2024 1223      Component Value Date/Time   TSH 1.380 02/23/2024 1223    Nutritional Lab Results  Component Value Date   VD25OH 24.8 (L) 02/23/2024   VD25OH 64.54 09/15/2018   VD25OH 31.25 07/15/2017    Attestations:   I, Vernell Forest, acting as a medical scribe for Gavin Jenkins, DO., have compiled all relevant documentation for today's office visit on behalf of Gavin Jenkins, DO, while in the presence of [employer], DO.  I have spent 62 minutes in the care of the patient today.  52 minutes was spent on face-to-face counseling and reviewing listed medical problems above as outlined in office visit note, providing nutritional and behavioral counseling as outlined in obesity care plan, independently interpreting results and goals of care, see listed medical problems, and discussing biometric information and progress. We reviewed his meal plan and discussed how the foods he is eating is affecting each one of his labs. Pt educated on why we want him to eat various foods in various amounts and has a better understanding of the nutritional plan because of this. All his questions were answered today.   I have reviewed the above documentation for accuracy and completeness, and I agree with the above. Gavin JINNY Smith, D.O.  The 21st Century Cures Act was signed into law in 2016 which includes the topic of electronic health records.  This provides immediate access to information in MyChart.  This includes consultation notes, operative notes, office notes, lab results and pathology reports.  If you have any questions about what you read please let us  know at your next visit so we can discuss your concerns and take corrective action if need be.  We are right here with you.

## 2024-03-13 ENCOUNTER — Other Ambulatory Visit: Payer: Self-pay

## 2024-03-13 MED ORDER — OMNIPOD 5 DEXG7G6 PODS GEN 5 MISC: 3 refills | Status: AC

## 2024-03-22 ENCOUNTER — Ambulatory Visit (INDEPENDENT_AMBULATORY_CARE_PROVIDER_SITE_OTHER): Admitting: Family Medicine

## 2024-04-04 ENCOUNTER — Ambulatory Visit (INDEPENDENT_AMBULATORY_CARE_PROVIDER_SITE_OTHER): Admitting: Family Medicine

## 2024-04-04 ENCOUNTER — Encounter (INDEPENDENT_AMBULATORY_CARE_PROVIDER_SITE_OTHER): Payer: Self-pay | Admitting: Family Medicine

## 2024-04-04 VITALS — BP 120/77 | HR 85 | Temp 98.3°F | Ht 67.0 in | Wt 278.0 lb

## 2024-04-04 DIAGNOSIS — E559 Vitamin D deficiency, unspecified: Secondary | ICD-10-CM | POA: Diagnosis not present

## 2024-04-04 DIAGNOSIS — I152 Hypertension secondary to endocrine disorders: Secondary | ICD-10-CM

## 2024-04-04 DIAGNOSIS — E1169 Type 2 diabetes mellitus with other specified complication: Secondary | ICD-10-CM | POA: Diagnosis not present

## 2024-04-04 DIAGNOSIS — Z794 Long term (current) use of insulin: Secondary | ICD-10-CM | POA: Diagnosis not present

## 2024-04-04 DIAGNOSIS — E1159 Type 2 diabetes mellitus with other circulatory complications: Secondary | ICD-10-CM | POA: Diagnosis not present

## 2024-04-04 DIAGNOSIS — Z6841 Body Mass Index (BMI) 40.0 and over, adult: Secondary | ICD-10-CM

## 2024-04-04 NOTE — Progress Notes (Signed)
 Gavin Smith, D.O.  ABFM, ABOM Specializing in Clinical Bariatric Medicine  Office located at: [address] [place], [place]  [address]   Assessment and Plan:   FOR THE DISEASE OF OBESITY:  Starting Morbid obesity (HCC) 44.47; Morbid obesity with BMI of 45.0-49.9, adult [facility]) current 43.53 Assessment & Plan: Since last office visit on 03/08/2024 patient's muscle mass has increased by 0.4 lbs. Fat mass has decreased by 2.6 lbs. Total body water has decreased by 2.8 lbs.  Body fat % has decreased by 0.6 %. Counseling done on how various foods will affect these numbers and how to maximize success  Total lbs lost to date: 6 lbs Total weight loss percentage to date: 2.11 %  - Tracking Calories/Macros: yes  - Eating More Whole Foods: yes  - Adequate Protein Intake: yes  - Adequate Water Intake: yes  - Skipping Meals: no - eating at least 1 meal daily  - Sleeping 7-9 Hours/ Night: no - struggles to fall asleep, averaging ~6 hours nightly. Denies napping. Does take Quviviq 50 mg at bedtime, but doesn't believe this is very effective.  Recommended Dietary Goals Gavin Smith is currently in the action stage of change. As such, his goal is to continue weight management plan.  He has agreed to: continue current plan  Behavioral Intervention We discussed the following today: increasing lean protein intake to established goals, avoiding skipping meals, increasing water intake , and work on managing stress, creating time for self-care and relaxation  Additional resources provided today: Handout on Healthy Tuna Salad Recipe   Evidence-based interventions for health behavior change were utilized today including the discussion of self monitoring techniques, problem-solving barriers and SMART goal setting techniques.   Regarding patient's less desirable eating habits and patterns, we employed the technique of small changes.   Goal(s) for next OV: increase meal frequency, focus on  self-care instead of putting himself second.   Recommended Physical Activity Goals Gavin Smith has been advised to work up to 300-450 minutes of moderate intensity aerobic activity a week and strengthening exercises 2-3 times per week for cardiovascular health, weight loss maintenance and preservation of muscle mass.   He has agreed to: Unable to participate in physical activity at present as he is supporting his partner, who is dealing with significant back pain causing their own mobility issues   Pharmacotherapy We both agreed to: Continue with current nutritional and behavioral strategies  ASSOCIATED CONDITIONS ADDRESSED TODAY:  Type 2 diabetes mellitus with other specified complication, with long-term current use of insulin  Gavin Smith) Assessment & Plan: Medication: Humalog  70-90 units daily and for hypoglycemia. Blood glucose monitored via CGM - patient shared report which said he is within range 73% of the time w/o any lows, and highs are attributed to pod failures.   Reviewed most recent HgbA1c, which improved 0.4 points from April 2025, and fasting insulin  was low-normal. Instructed patient to focus on protein-rich, low simple carbohydrate foods. We reviewed the importance of hydration, regular exercise for stress reduction, and restorative sleep. Additionally discussed the importance of eating on a regular basis- no skipping or going long periods without eating. Educated patient on affect that missing meals has on metabolism. Advised he continue to monitor blood sugars and regular follow-up with Endocrinologist for thorough diabetes management.   Lab Results  Component Value Date   HGBA1C 6.0 (H) 02/23/2024   HGBA1C 6.4 (H) 11/18/2023   HGBA1C 7.4 (A) 06/02/2023   INSULIN  2.7 02/23/2024    Lab Results  Component Value Date   LDLCALC 72 02/23/2024   CREATININE 0.97 02/23/2024     Hypertension associated with diabetes (HCC) Discontinued Lisinopril  10 mg daily in April 2025; current  medication: Demadex 20 mg. Blood pressures are well-controlled in-office and reportedly at home as well with this morning's reading of 120/80. Creatinine within normal limits. Instructed to continue monitoring blood pressures at home. Continue avoiding void buying foods that are: processed, frozen, or prepackaged to avoid excess salt. We will continue to monitor closely alongside his PCP and/or Specialist.  Regular follow up with PCP and specialists was also encouraged.  BP Readings from Last 3 Encounters:  04/04/24 120/77  03/08/24 105/62  02/23/24 114/77   Lab Results  Component Value Date   CREATININE 0.97 02/23/2024     Vitamin D  Deficiency Assessment & Plan: Followed by Endocrinologist, Dr. Tawni Fendt, who has prescribed him Vitamin-D 50,000 units every other day, which he has been tolerating without side effects. Despite compliance, his most recent Vitamin-D was significantly below goal of 50-70. His B12 and folate continue to remain at goal. Reviewed most recent Vitamin-D, B12 & folate with patient. Recommended discussing increasing supplementation to daily, especially as we head into the autumn/winter seasons. Counseled on symptoms related to Vitamin-D deficiency (I.e hunger and cravings pathways, mood, energy levels, and joint pain).  Lab Results  Component Value Date   VD25OH 24.8 (L) 02/23/2024   Lab Results  Component Value Date   VITAMINB12 551 02/23/2024   FOLATE 5.2 02/23/2024   Follow up:   Return in 4 weeks (on 05/02/2024). He was informed of the importance of frequent follow up visits to maximize his success with intensive lifestyle modifications for his multiple health conditions.  Subjective:   Chief complaint: Obesity Gavin Smith is here to discuss his progress with his obesity treatment plan. He is on  the Category 2 Plan w B/L options and journaling 1200-1400 calories and 90+ g of protein per day and states he is following his eating plan approximately 100% of  the time. Pt is not exercising while taking care of his partner.  Interval History:  Gavin Smith is here for a follow up office visit. Pt has experienced a weight loss of 2 LBS since last OV on 03/08/2024.  In the interim, he admits that while his partner has been in pain he has been neglecting himself to take care of her. He does endorse compliance with journaling his calories & macros, noting that he discovered that he struggled to eat or hydrate adequately as recommended. Despite this, he denies experiencing hunger/cravings. Additionally, he has improved his meal choices and is only drinking 1 protein drink/ day.    Pharmacotherapy that aid with weight loss: He is currently taking no anti-obesity medication.    Review of Systems:  Pertinent positives were addressed with patient today.  Reviewed by clinician on day of visit: allergies, medications, problem list, medical history, surgical history, family history, social history, and previous encounter notes.  Weight Summary and Biometrics   Weight Lost Since Last Visit: 2lb  Weight Gained Since Last Visit: 0    Vitals Temp: 98.3 F (36.8 C) BP: 120/77 Pulse Rate: 85 SpO2: 97 %   Anthropometric Measurements Height: 5' 7 (1.702 m) Weight: 278 lb (126.1 kg) BMI (Calculated): 43.53 Weight at Last Visit: 280lb Weight Lost Since Last Visit: 2lb Weight Gained Since Last Visit: 0 Starting Weight: 284lb Total Weight Loss (lbs): 6 lb (2.722 kg) Peak Weight: 376lb   Body Composition  Body Fat %: 40.4 % Fat Mass (lbs): 112.4 lbs Muscle Mass (lbs): 157.6 lbs Total Body Water (lbs): 123.2 lbs Visceral Fat Rating : 25   Other Clinical Data Fasting: no Labs: no Today's Visit #: 3 Starting Date: 02/23/24    Objective:   PHYSICAL EXAM: Blood pressure 120/77, pulse 85, temperature 98.3 F (36.8 C), height 5' 7 (1.702 m), weight 278 lb (126.1 kg), SpO2 97%. Body mass index is 43.54 kg/m.  General: he is overweight,  cooperative and in no acute distress. PSYCH: Has normal mood, affect and thought process.   HEENT: EOMI, sclerae are anicteric. Lungs: Normal breathing effort, no conversational dyspnea. Extremities: Moves * 4 Neurologic: A and O * 3, good insight  DIAGNOSTIC DATA REVIEWED: BMET    Component Value Date/Time   NA 141 02/23/2024 1223   K 4.0 02/23/2024 1223   CL 101 02/23/2024 1223   CO2 25 02/23/2024 1223   GLUCOSE 118 (H) 02/23/2024 1223   GLUCOSE 114 (H) 11/18/2023 1039   BUN 16 02/23/2024 1223   CREATININE 0.97 02/23/2024 1223   CREATININE 0.81 07/15/2017 1126   CALCIUM 9.5 02/23/2024 1223   GFRNONAA >60 11/18/2023 1039   GFRNONAA 91 07/15/2017 1126   GFRAA 106 07/15/2017 1126   Lab Results  Component Value Date   HGBA1C 6.0 (H) 02/23/2024   HGBA1C 7.6 (H) 05/28/2011   Lab Results  Component Value Date   INSULIN  2.7 02/23/2024   Lab Results  Component Value Date   TSH 1.380 02/23/2024   CBC    Component Value Date/Time   WBC 6.2 02/23/2024 1223   WBC 10.3 11/18/2023 1039   RBC 5.46 02/23/2024 1223   RBC 5.77 11/18/2023 1039   HGB 16.7 02/23/2024 1223   HCT 50.6 02/23/2024 1223   PLT 211 02/23/2024 1223   MCV 93 02/23/2024 1223   MCH 30.6 02/23/2024 1223   MCH 30.0 11/18/2023 1039   MCHC 33.0 02/23/2024 1223   MCHC 33.0 11/18/2023 1039   RDW 12.3 02/23/2024 1223   Iron Studies No results found for: IRON, TIBC, FERRITIN, IRONPCTSAT Lipid Panel     Component Value Date/Time   CHOL 158 02/23/2024 1223   TRIG 237 (H) 02/23/2024 1223   HDL 47 02/23/2024 1223   CHOLHDL 3.4 02/23/2024 1223   CHOLHDL 5 07/15/2017 1126   VLDL 74.4 (H) 07/15/2017 1126   LDLCALC 72 02/23/2024 1223   LDLDIRECT 119.0 07/15/2017 1126   Hepatic Function Panel     Component Value Date/Time   PROT 7.3 02/23/2024 1223   ALBUMIN 4.4 02/23/2024 1223   AST 43 (H) 02/23/2024 1223   ALT 31 02/23/2024 1223   ALKPHOS 84 02/23/2024 1223   BILITOT 2.9 (H) 02/23/2024 1223       Component Value Date/Time   TSH 1.380 02/23/2024 1223   Nutritional Lab Results  Component Value Date   VD25OH 24.8 (L) 02/23/2024   VD25OH 64.54 09/15/2018   VD25OH 31.25 07/15/2017    Attestations:   LILLETTE Damien Blanks, acting as a Stage manager for Gavin Jenkins, DO., have compiled all relevant documentation for today's office visit on behalf of Gavin Jenkins, DO, while in the presence of [employer], DO.  I have spent 49 minutes in the care of the patient today including 44 minutes face-to-face assessing and reviewing listed medical problems above as outlined in office visit note and providing nutritional and behavioral counseling as outlined in obesity care plan.   I have reviewed the above documentation  for accuracy and completeness, and I agree with the above. Gavin JINNY Smith, D.O.  The 21st Century Cures Act was signed into law in 2016 which includes the topic of electronic health records.  This provides immediate access to information in MyChart.  This includes consultation notes, operative notes, office notes, lab results and pathology reports.  If you have any questions about what you read please let us  know at your next visit so we can discuss your concerns and take corrective action if need be.  We are right here with you.

## 2024-04-11 ENCOUNTER — Telehealth: Payer: Self-pay

## 2024-04-11 NOTE — Telephone Encounter (Signed)
 Called and left voicemail to ask pt to bring in CPAP or BiPAP if using one.

## 2024-04-12 ENCOUNTER — Ambulatory Visit (INDEPENDENT_AMBULATORY_CARE_PROVIDER_SITE_OTHER)

## 2024-04-12 VITALS — BP 138/91 | HR 78 | Ht 67.0 in | Wt 286.8 lb

## 2024-04-12 DIAGNOSIS — G4733 Obstructive sleep apnea (adult) (pediatric): Secondary | ICD-10-CM

## 2024-04-12 DIAGNOSIS — J4520 Mild intermittent asthma, uncomplicated: Secondary | ICD-10-CM

## 2024-04-12 DIAGNOSIS — Z6841 Body Mass Index (BMI) 40.0 and over, adult: Secondary | ICD-10-CM | POA: Diagnosis not present
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
## 2024-04-12 NOTE — Patient Instructions (Addendum)
 Notification of test results are managed in the following manner: If there are any recommendations or changes to the plan of care discussed in office today, we will contact you and let you know what they are. If you do not hear from us , then your results are normal/expected and you can view them through your MyChart account, or a letter will be sent to you. Thank you again for trusting us  with your care [facility].  Change airspura to as needed.  Take symbicort 80 2 puff BID scheduled.  Stop ventolin .

## 2024-04-20 ENCOUNTER — Other Ambulatory Visit (HOSPITAL_COMMUNITY)
Admission: RE | Admit: 2024-04-20 | Discharge: 2024-04-20 | Disposition: A | Discharge status: Home / Self Care | Payer: Self-pay | Source: Ambulatory Visit | Attending: Medical Genetics | Admitting: Medical Genetics

## 2024-04-23 NOTE — Patient Instructions (Signed)
 SURGICAL WAITING ROOM VISITATION Patients having surgery or a procedure may have no more than 2 support people in the waiting area - these visitors may rotate in the visitor waiting room.   Due to an increase in RSV and influenza rates and associated hospitalizations, children ages 84 and under may not visit patients in [facility] hospitals. If the patient needs to stay at the hospital during part of their recovery, the visitor guidelines for inpatient rooms apply.  PRE-OP VISITATION  Pre-op nurse will coordinate an appropriate time for 1 support person to accompany the patient in pre-op.  This support person may not rotate.  This visitor will be contacted when the time is appropriate for the visitor to come back in the pre-op area.  Please refer to the [facility] website for the visitor guidelines for Inpatients (after your surgery is over and you are in a regular room).  You are not required to quarantine at this time prior to your surgery. However, you must do this: Hand Hygiene often Do NOT share personal items Notify your provider if you are in close contact with someone who has COVID or you develop fever 100.4 or greater, new onset of sneezing, cough, sore throat, shortness of breath or body aches.  If you test positive for Covid or have been in contact with anyone that has tested positive in the last 10 days please notify you surgeon.    Your procedure is scheduled on: 05/11/24   Report to [facility] Main Entrance: [place] entrance where the [employer] is available.   Report to admitting at: 10:00 AM  Call this number if you have any questions or problems the morning of surgery [phone]  FOLLOW ANY ADDITIONAL PRE OP INSTRUCTIONS YOU RECEIVED FROM YOUR SURGEON'S OFFICE!!!  Do not eat food or drink fluids after Midnight the night prior to your surgery/procedure.   Oral Hygiene is also important to reduce your risk of infection.        Remember - BRUSH YOUR TEETH  THE MORNING OF SURGERY WITH YOUR REGULAR TOOTHPASTE  Do NOT smoke after Midnight the night before surgery.  STOP TAKING all Vitamins, Herbs and supplements 1 week before your surgery.   Take ONLY these medicines the morning of surgery with A SIP OF WATER: Use inhalers and nebulizer as usual.Rizatriptan as needed.  How to Manage Your Diabetes Before and After Surgery  Why is it important to control my blood sugar before and after surgery? Improving blood sugar levels before and after surgery helps healing and can limit problems. A way of improving blood sugar control is eating a healthy diet by:  Eating less sugar and carbohydrates  Increasing activity/exercise  Talking with your doctor about reaching your blood sugar goals High blood sugars (greater than 180 mg/dL) can raise your risk of infections and slow your recovery, so you will need to focus on controlling your diabetes during the weeks before surgery. Make sure that the doctor who takes care of your diabetes knows about your planned surgery including the date and location.  How do I manage my blood sugar before surgery? Check your blood sugar at least 4 times a day, starting 2 days before surgery, to make sure that the level is not too high or low. Check your blood sugar the morning of your surgery when you wake up and every 2 hours until you get to the Short Stay unit. If your blood sugar is less than 70 mg/dL, you will need  to treat for low blood sugar: Do not take insulin . Treat a low blood sugar (less than 70 mg/dL) with  cup of clear juice (cranberry or apple), 4 glucose tablets, OR glucose gel. Recheck blood sugar in 15 minutes after treatment (to make sure it is greater than 70 mg/dL). If your blood sugar is not greater than 70 mg/dL on recheck, call [phone] for further instructions. Report your blood sugar to the short stay nurse when you get to Short Stay.  If you are admitted to the hospital after surgery: Your  blood sugar will be checked by the staff and you will probably be given insulin  after surgery (instead of oral diabetes medicines) to make sure you have good blood sugar levels. The goal for blood sugar control after surgery is 80-180 mg/dL.  WHAT DO I DO ABOUT MY DIABETES MEDICATION?  If your CBG is greater than 220 mg/dL, you may take  of your sliding scale  (correction) dose of insulin .    If You have been diagnosed with Sleep Apnea - Bring CPAP mask and tubing day of surgery. We will provide you with a CPAP machine on the day of your surgery.                   You may not have any metal on your body including hair pins, jewelry, and body piercing  Do not wear make-up, lotions, powders, perfumes / cologne, or deodorant  Do not wear nail polish including gel and S&S, artificial / acrylic nails, or any other type of covering on natural nails including finger and toenails. If you have artificial nails, gel coating, etc., that needs to be removed by a nail salon, Please have this removed prior to surgery. Not doing so may mean that your surgery could be cancelled or delayed if the Surgeon or anesthesia staff feels like they are unable to monitor you safely.   Do not shave 48 hours prior to surgery to avoid nicks in your skin which may contribute to postoperative infections.   Men may shave face and neck.  Contacts, Hearing Aids, dentures or bridgework may not be worn into surgery. DENTURES WILL BE REMOVED PRIOR TO SURGERY PLEASE DO NOT APPLY Poly grip OR ADHESIVES!!!  You may bring a small overnight bag with you on the day of surgery, only pack items that are not valuable. [facility] IS NOT RESPONSIBLE   FOR VALUABLES THAT ARE LOST OR STOLEN.   Patients discharged on the day of surgery will not be allowed to drive home.  Someone NEEDS to stay with you for the first 24 hours after anesthesia.  Do not bring your home medications to the hospital. The Pharmacy will dispense medications  listed on your medication list to you during your admission in the Hospital.  Special Instructions: Bring a copy of your healthcare power of attorney and living will documents the day of surgery, if you wish to have them scanned into your [facility] Records- EPIC  Please read over the following fact sheets you were given: IF YOU HAVE QUESTIONS ABOUT YOUR PRE-OP INSTRUCTIONS, PLEASE CALL [phone]   [facility] - Preparing for Surgery Before surgery, you can play an important role.  Because skin is not sterile, your skin needs to be as free of germs as possible.  You can reduce the number of germs on your skin by washing with CHG (chlorahexidine gluconate) soap before surgery.  CHG is an antiseptic cleaner which kills germs and bonds with  the skin to continue killing germs even after washing. Please DO NOT use if you have an allergy to CHG or antibacterial soaps.  If your skin becomes reddened/irritated stop using the CHG and inform your nurse when you arrive at Short Stay. Do not shave (including legs and underarms) for at least 48 hours prior to the first CHG shower.  You may shave your face/neck.  Please follow these instructions carefully:  1.  Shower with CHG Soap the night before surgery and the  morning of surgery.  2.  If you choose to wash your hair, wash your hair first as usual with your normal  shampoo.  3.  After you shampoo, rinse your hair and body thoroughly to remove the shampoo.                             4.  Use CHG as you would any other liquid soap.  You can apply chg directly to the skin and wash.  Gently with a scrungie or clean washcloth.  5.  Apply the CHG Soap to your body ONLY FROM THE NECK DOWN.   Do not use on face/ open                           Wound or open sores. Avoid contact with eyes, ears mouth and genitals (private parts).                       Wash face,  Genitals (private parts) with your normal soap.             6.  Wash thoroughly, paying  special attention to the area where your  surgery  will be performed.  7.  Thoroughly rinse your body with warm water from the neck down.  8.  DO NOT shower/wash with your normal soap after using and rinsing off the CHG Soap.            9.  Pat yourself dry with a clean towel.            10.  Wear clean pajamas.            11.  Place clean sheets on your bed the night of your first shower and do not  sleep with pets.  ON THE DAY OF SURGERY : Do not apply any lotions/deodorants the morning of surgery.  Please wear clean clothes to the hospital/surgery center.     FAILURE TO FOLLOW THESE INSTRUCTIONS MAY RESULT IN THE CANCELLATION OF YOUR SURGERY  PATIENT SIGNATURE_________________________________  NURSE SIGNATURE__________________________________  ________________________________________________________________________

## 2024-04-24 ENCOUNTER — Encounter (HOSPITAL_COMMUNITY)
Admission: RE | Admit: 2024-04-24 | Discharge: 2024-04-24 | Disposition: A | Discharge status: Home / Self Care | Source: Ambulatory Visit | Attending: Urology | Admitting: Urology

## 2024-04-24 ENCOUNTER — Encounter (HOSPITAL_COMMUNITY): Payer: Self-pay

## 2024-04-24 ENCOUNTER — Other Ambulatory Visit: Payer: Self-pay

## 2024-04-24 VITALS — BP 152/90 | HR 80 | Temp 98.6°F | Ht 67.0 in | Wt 285.0 lb

## 2024-04-24 DIAGNOSIS — Z01812 Encounter for preprocedural laboratory examination: Secondary | ICD-10-CM | POA: Diagnosis present

## 2024-04-24 DIAGNOSIS — I10 Essential (primary) hypertension: Secondary | ICD-10-CM | POA: Diagnosis not present

## 2024-04-24 DIAGNOSIS — E1142 Type 2 diabetes mellitus with diabetic polyneuropathy: Secondary | ICD-10-CM | POA: Insufficient documentation

## 2024-04-24 LAB — BASIC METABOLIC PANEL WITH GFR
Anion gap: 10 (ref 5–15)
BUN: 16 mg/dL (ref 6–20)
CO2: 27 mmol/L (ref 22–32)
Calcium: 9.7 mg/dL (ref 8.9–10.3)
Chloride: 102 mmol/L (ref 98–111)
Creatinine, Ser: 1 mg/dL (ref 0.61–1.24)
GFR, Estimated: >60 mL/min (ref >60)
Glucose, Bld: 151 mg/dL — ABNORMAL HIGH (ref 70–99)
Potassium: 4 mmol/L (ref 3.5–5.1)
Sodium: 139 mmol/L (ref 135–145)

## 2024-04-24 LAB — CBC
HCT: 51 % (ref 39.0–52.0)
Hemoglobin: 16.7 g/dL (ref 13.0–17.0)
MCH: 29.8 pg (ref 26.0–34.0)
MCHC: 32.7 g/dL (ref 30.0–36.0)
MCV: 90.9 fL (ref 80.0–100.0)
Platelets: 220 K/uL (ref 150–400)
RBC: 5.61 MIL/uL (ref 4.22–5.81)
RDW: 12.7 % (ref 11.5–15.5)
WBC: 8.3 K/uL (ref 4.0–10.5)
nRBC: 0 % (ref 0.0–0.2)

## 2024-04-24 LAB — GLUCOSE, CAPILLARY: Glucose-Capillary: 157 mg/dL — ABNORMAL HIGH (ref 70–99)

## 2024-04-24 NOTE — Progress Notes (Signed)
 For Anesthesia: PCP - Elaine Garnette BIRCH., MD  Cardiologist - Revankar, Jennifer SAUNDERS, MD . LOV: 08/21/23  Bowel Prep reminder:N/A  Chest x-ray -  EKG - 11/18/23 Stress Test -  ECHO - 08/25/21 Cardiac Cath -  Pacemaker/ICD device last checked: Pacemaker orders received: Device Rep notified:  Spinal Cord Stimulator:N/A  Sleep Study - Yes CPAP - Yes  Fasting Blood Sugar - 200's Checks Blood Sugar : Dexcom 7 Date and result of last Hgb A1c- 6.0: 02/23/24  Last dose of GLP1 agonist- N/A GLP1 instructions: Hold 7 days prior to schedule (Hold 24 hours-daily)   Last dose of SGLT-2 inhibitors- N/A SGLT-2 instructions: Hold 72 hours prior to surgery  Blood Thinner Instructions:N/A Last Dose: Time last taken:  Aspirin Instructions:N/A Last Dose: Time last taken:  Activity level: Can go up a flight of stairs and activities of daily living without stopping and without chest pain and/or shortness of breath   Able to exercise without chest pain and/or shortness of breath  Anesthesia review: HX: HTN,Murmur,DIA 1,Asperger's syndrome,OSA(CPAP)  Patient denies shortness of breath, fever, cough and chest pain at PAT appointment   Patient verbalized understanding of instructions that were reviewed over the telephone.

## 2024-05-02 ENCOUNTER — Encounter (INDEPENDENT_AMBULATORY_CARE_PROVIDER_SITE_OTHER): Payer: Self-pay | Admitting: Family Medicine

## 2024-05-02 ENCOUNTER — Ambulatory Visit (INDEPENDENT_AMBULATORY_CARE_PROVIDER_SITE_OTHER): Admitting: Family Medicine

## 2024-05-02 ENCOUNTER — Encounter: Payer: Self-pay | Admitting: Internal Medicine

## 2024-05-02 VITALS — BP 112/77 | HR 84 | Temp 99.0°F | Ht 67.0 in | Wt 280.0 lb

## 2024-05-02 DIAGNOSIS — I152 Hypertension secondary to endocrine disorders: Secondary | ICD-10-CM

## 2024-05-02 DIAGNOSIS — E1159 Type 2 diabetes mellitus with other circulatory complications: Secondary | ICD-10-CM

## 2024-05-02 DIAGNOSIS — Z6841 Body Mass Index (BMI) 40.0 and over, adult: Secondary | ICD-10-CM

## 2024-05-02 DIAGNOSIS — E65 Localized adiposity: Secondary | ICD-10-CM

## 2024-05-02 DIAGNOSIS — Z794 Long term (current) use of insulin: Secondary | ICD-10-CM

## 2024-05-02 DIAGNOSIS — E559 Vitamin D deficiency, unspecified: Secondary | ICD-10-CM

## 2024-05-02 DIAGNOSIS — E1169 Type 2 diabetes mellitus with other specified complication: Secondary | ICD-10-CM

## 2024-05-02 LAB — GENECONNECT MOLECULAR SCREEN: Genetic Analysis Overall Interpretation: NEGATIVE

## 2024-05-02 NOTE — Progress Notes (Signed)
 Gavin Smith, D.O.  ABFM, ABOM Specializing in Clinical Bariatric Medicine  Office located at: [address] [place], [place]  [address]    FOR THE CHRONIC DISEASE OF OBESITY:   Starting Morbid obesity (HCC) 44.47; Morbid obesity with BMI of 45.0-49.9, adult (HCC) current 43.84  Weight Summary and Body Composition Analysis (BIA)   Weight Lost Since Last Visit: 0lb  Weight Gained Since Last Visit: 2lb    Vitals Temp: 99 F (37.2 C) BP: 112/77 Pulse Rate: 84 SpO2: 97 %   Anthropometric Measurements Height: 5' 7 (1.702 m) Weight: 280 lb (127 kg) BMI (Calculated): 43.84 Weight at Last Visit: 278lb Weight Lost Since Last Visit: 0lb Weight Gained Since Last Visit: 2lb Starting Weight: 284lb Total Weight Loss (lbs): 4 lb (1.814 kg) Peak Weight: 376lb Waist Measurement : 51 inches   Body Composition  Body Fat %: 41.2 % Fat Mass (lbs): 115.6 lbs Muscle Mass (lbs): 157 lbs Total Body Water (lbs): 123 lbs Visceral Fat Rating : 26   Other Clinical Data Fasting: no Labs: no Today's Visit #: 4 Starting Date: 02/23/24   Chief complaint: Obesity  Interval History Gavin Smith is here for a follow-up office visit to discuss his progress with his obesity treatment plan. He is keeping a food journal and adhering to recommended goals of 1200-1400 calories and 90+ grams protein and states he is following his eating plan approximately 100 % of the time. He is doing cardio 60  minutes 3 days per week  He has experienced a weight gain of 2 lbs since last OV on 04/04/2024.   His dietary and life habits include:  - Tracking Calories/Macros: yes, he is hitting calorie goals on most days per journaling log  - Eating More Whole Foods: yes  - Adequate Protein Intake: yes, he is hitting protein goals on most day per journaling log.  - Adequate Water Intake: yes  - Skipping Meals: no  - Sleeping 7-9 Hours/ Night: yes    04/04/24 13:00 05/02/24 14:00   Body  Fat % 40.4 % 41.2 %  Muscle Mass (lbs) 157.6 lbs 157 lbs  Fat Mass (lbs) 112.4 lbs 115.6 lbs  Total Body Water (lbs) 123.2 lbs 123 lbs  Visceral Fat Rating  25 26   Counseling done on how various foods will affect these numbers and how to maximize success  Total lbs lost to date: - 4 lbs Total Fat Mass lost to date: -1.6 lbs Total weight loss percentage to date: - 1.41 %   Recommended Dietary Goals Gavin Smith is currently in the action stage of change. As such, his goal is to continue weight management plan.  Slightly modified his journaling parameters to 1200-1300 cal and 85+ grams protein.    Nutritional and Behavioral Counseling:  We discussed the following today: reading nutritional labels, increasing lean protein intake to established goals, work on tracking and journaling calories using tracking application, and continue to work on implementation of reduced calorie nutritional plan  Additional resources provided today: Handout on hidden calories   Evidence-based interventions for health behavior change were utilized today including the discussion of self monitoring techniques, problem-solving barriers and SMART goal setting techniques.   Regarding patient's less desirable eating habits and patterns, we employed the technique of small changes.   SMART Goal(s) created today: journal accurately and be mindful of any sources of excess/hidden calories   Recommended Physical Activity Goals Gavin Smith has been advised to work up to 300-450 minutes of  moderate intensity aerobic activity a week and strengthening exercises 2-3 times per week for cardiovascular health, weight loss maintenance and preservation of muscle mass.   He was encouraged to increase his exercise to 4-5 days a week.   Medical Interventions and Pharmacotherapy Previous Bariatric surgery: n/a Pharmacotherapy: See T2DM note.   OBESITY RELATED CONDITIONS ADDRESSED TODAY:    Type 2 diabetes mellitus with other  specified complication, with long-term current use of insulin  [facility]) Assessment & Plan: Lab Results  Component Value Date   HGBA1C 6.0 (H) 02/23/2024   HGBA1C 6.4 (H) 11/18/2023   HGBA1C 7.4 (A) 06/02/2023   INSULIN  2.7 02/23/2024    T2DM managed by endocrinologist Gavin Smith. Pt currently on Humalog  70-90 units daily. Pt has a CGM and states his blood sugars have been running high with an average of 190 the past couple of days. He reports compliance with his low carb, high protein meal plan. I contacted Gavin Smith during our office visit to determine if she would be opposed to pt starting a GLP-GIP (preferably Mounjaro). Pt will also reach out to Gavin Smith about this. We will await her response and make any changes at next OV. Cont treatment per endocrinology. Cont to decrease simple carbs/ sugars; increase fiber and proteins -> follow meal plan.    Hypertension associated with diabetes [facility]) Assessment & Plan: Last 3 blood pressure readings in our office are as follows: BP Readings from Last 3 Encounters:  05/02/24 112/77  04/24/24 (!) 152/90  04/12/24 (!) 138/91   The 10-year ASCVD risk score (Arnett DK, et al., 2019) is: 2.9%  Lab Results  Component Value Date   CREATININE 1.00 04/24/2024   BP at goal today. No acute concerns. Cont Demadex 20 mg twice daily and low sodium nutritional plan.     Visceral obesity Assessment & Plan: Current visceral fat rating: 26.  The visceral fat rating should be  < 10 in a male. Visceral adipose tissue is a hormonally active component of total body fat. This body composition phenotype is associated with medical disorders such as metabolic syndrome, cardiovascular disease and several malignancies including prostate, breast, and colorectal cancers. Continue losing 7-10% of weight via prudent nutritional plan and lifestyle changes.     Vitamin D  deficiency Assessment & Plan: Lab Results  Component Value Date   VD25OH 24.8 (L) 02/23/2024    VD25OH 64.54 09/15/2018   VD25OH 31.25 07/15/2017   On Ergocalciferol  50,000 units daily per endocrinology. Cont supplementation and wt loss efforts. Recheck levels 1-2 months.     Objective:   PHYSICAL EXAM: Blood pressure 112/77, pulse 84, temperature 99 F (37.2 C), height 5' 7 (1.702 m), weight 280 lb (127 kg), SpO2 97%. Body mass index is 43.85 kg/m.  General: he is overweight, cooperative and in no acute distress. PSYCH: Has normal mood, affect and thought process.   HEENT: EOMI, sclerae are anicteric. Lungs: Normal breathing effort, no conversational dyspnea. Extremities: Moves * 4 Neurologic: A and O * 3, good insight  DIAGNOSTIC DATA REVIEWED: BMET    Component Value Date/Time   NA 139 04/24/2024 1053   NA 141 02/23/2024 1223   K 4.0 04/24/2024 1053   CL 102 04/24/2024 1053   CO2 27 04/24/2024 1053   GLUCOSE 151 (H) 04/24/2024 1053   BUN 16 04/24/2024 1053   BUN 16 02/23/2024 1223   CREATININE 1.00 04/24/2024 1053   CREATININE 0.81 07/15/2017 1126   CALCIUM 9.7 04/24/2024 1053   GFRNONAA >60 04/24/2024 1053   GFRNONAA  91 07/15/2017 1126   GFRAA 106 07/15/2017 1126   Lab Results  Component Value Date   HGBA1C 6.0 (H) 02/23/2024   HGBA1C 7.6 (H) 05/28/2011   Lab Results  Component Value Date   INSULIN  2.7 02/23/2024   Lab Results  Component Value Date   TSH 1.380 02/23/2024   CBC    Component Value Date/Time   WBC 8.3 04/24/2024 1053   RBC 5.61 04/24/2024 1053   HGB 16.7 04/24/2024 1053   HGB 16.7 02/23/2024 1223   HCT 51.0 04/24/2024 1053   HCT 50.6 02/23/2024 1223   PLT 220 04/24/2024 1053   PLT 211 02/23/2024 1223   MCV 90.9 04/24/2024 1053   MCV 93 02/23/2024 1223   MCH 29.8 04/24/2024 1053   MCHC 32.7 04/24/2024 1053   RDW 12.7 04/24/2024 1053   RDW 12.3 02/23/2024 1223   Iron Studies No results found for: IRON, TIBC, FERRITIN, IRONPCTSAT Lipid Panel     Component Value Date/Time   CHOL 158 02/23/2024 1223   TRIG 237  (H) 02/23/2024 1223   HDL 47 02/23/2024 1223   CHOLHDL 3.4 02/23/2024 1223   CHOLHDL 5 07/15/2017 1126   VLDL 74.4 (H) 07/15/2017 1126   LDLCALC 72 02/23/2024 1223   LDLDIRECT 119.0 07/15/2017 1126   Hepatic Function Panel     Component Value Date/Time   PROT 7.3 02/23/2024 1223   ALBUMIN 4.4 02/23/2024 1223   AST 43 (H) 02/23/2024 1223   ALT 31 02/23/2024 1223   ALKPHOS 84 02/23/2024 1223   BILITOT 2.9 (H) 02/23/2024 1223      Component Value Date/Time   TSH 1.380 02/23/2024 1223   Nutritional Lab Results  Component Value Date   VD25OH 24.8 (L) 02/23/2024   VD25OH 64.54 09/15/2018   VD25OH 31.25 07/15/2017     Follow up:   Return 05/30/2024 at 11:40 AM.  He was informed of the importance of frequent follow up visits to maximize his success with intensive lifestyle modifications for his multiple health conditions.   Attestations:   I, Special Puri, acting as a Stage manager for [employer], DO., have compiled all relevant documentation for today's office visit on behalf of Gavin Jenkins, DO, while in the presence of [employer], DO.  Pertinent positives were addressed with patient today. Reviewed by clinician on day of visit: allergies, medications, problem list, medical history, surgical history, family history, social history, and previous encounter notes.  I have reviewed the above documentation for accuracy and completeness, and I agree with the above. Gavin JINNY Smith, D.O.  The 21st Century Cures Act was signed into law in 2016 which includes the topic of electronic health records.  This provides immediate access to information in MyChart. This includes consultation notes, operative notes, office notes, lab results and pathology reports.  If you have any questions about what you read please let us  know at your next visit so we can discuss your concerns and take corrective action if need be.  We are right here with you.

## 2024-05-11 ENCOUNTER — Encounter (HOSPITAL_COMMUNITY)
Admission: RE | Disposition: A | Discharge status: Home / Self Care | Payer: Self-pay | Source: Ambulatory Visit | Attending: Urology

## 2024-05-11 ENCOUNTER — Ambulatory Visit (HOSPITAL_COMMUNITY)

## 2024-05-11 ENCOUNTER — Ambulatory Visit (HOSPITAL_COMMUNITY)
Admission: RE | Admit: 2024-05-11 | Discharge: 2024-05-11 | Disposition: A | Discharge status: Home / Self Care | Source: Ambulatory Visit | Attending: Urology | Admitting: Urology

## 2024-05-11 ENCOUNTER — Encounter (HOSPITAL_COMMUNITY): Payer: Self-pay | Admitting: Urology

## 2024-05-11 ENCOUNTER — Ambulatory Visit (HOSPITAL_COMMUNITY): Payer: Self-pay | Admitting: Medical

## 2024-05-11 DIAGNOSIS — F640 Transsexualism: Secondary | ICD-10-CM | POA: Diagnosis not present

## 2024-05-11 DIAGNOSIS — F32A Depression, unspecified: Secondary | ICD-10-CM | POA: Insufficient documentation

## 2024-05-11 DIAGNOSIS — I10 Essential (primary) hypertension: Secondary | ICD-10-CM | POA: Insufficient documentation

## 2024-05-11 DIAGNOSIS — J45909 Unspecified asthma, uncomplicated: Secondary | ICD-10-CM | POA: Diagnosis not present

## 2024-05-11 DIAGNOSIS — G4730 Sleep apnea, unspecified: Secondary | ICD-10-CM | POA: Diagnosis not present

## 2024-05-11 DIAGNOSIS — N319 Neuromuscular dysfunction of bladder, unspecified: Secondary | ICD-10-CM | POA: Diagnosis present

## 2024-05-11 DIAGNOSIS — Z794 Long term (current) use of insulin: Secondary | ICD-10-CM | POA: Insufficient documentation

## 2024-05-11 DIAGNOSIS — N3281 Overactive bladder: Secondary | ICD-10-CM | POA: Diagnosis not present

## 2024-05-11 DIAGNOSIS — E66813 Obesity, class 3: Secondary | ICD-10-CM | POA: Diagnosis not present

## 2024-05-11 DIAGNOSIS — Z6841 Body Mass Index (BMI) 40.0 and over, adult: Secondary | ICD-10-CM | POA: Insufficient documentation

## 2024-05-11 DIAGNOSIS — Z7722 Contact with and (suspected) exposure to environmental tobacco smoke (acute) (chronic): Secondary | ICD-10-CM | POA: Insufficient documentation

## 2024-05-11 DIAGNOSIS — E1142 Type 2 diabetes mellitus with diabetic polyneuropathy: Secondary | ICD-10-CM | POA: Diagnosis not present

## 2024-05-11 HISTORY — PX: CYSTOSCOPY WITH INJECTION: SHX1424

## 2024-05-11 LAB — GLUCOSE, CAPILLARY
Glucose-Capillary: 132 mg/dL — ABNORMAL HIGH (ref 70–99)
Glucose-Capillary: 109 mg/dL — ABNORMAL HIGH (ref 70–99)
Glucose-Capillary: 109 mg/dL — ABNORMAL HIGH (ref 70–99)

## 2024-05-11 SURGERY — CYSTOSCOPY, WITH INJECTION OF BLADDER NECK OR BLADDER WALL
Anesthesia: General | Site: Bladder

## 2024-05-11 MED ORDER — SODIUM CHLORIDE 0.9 % IV SOLN
2.0000 g | INTRAVENOUS | Status: AC
  Administered 2024-05-11: 2 g via INTRAVENOUS
  Filled 2024-05-11: qty 20

## 2024-05-11 MED ORDER — HYDROMORPHONE HCL 1 MG/ML IJ SOLN: 0.2500 mg | INTRAMUSCULAR | Status: DC | PRN

## 2024-05-11 MED ORDER — FENTANYL CITRATE (PF) 100 MCG/2ML IJ SOLN
INTRAMUSCULAR | Status: AC
  Filled 2024-05-11: qty 2

## 2024-05-11 MED ORDER — ONDANSETRON HCL 4 MG/2ML IJ SOLN
INTRAMUSCULAR | Status: DC | PRN
  Administered 2024-05-11: 4 mg via INTRAVENOUS

## 2024-05-11 MED ORDER — MEPERIDINE HCL 100 MG/ML IJ SOLN: 6.2500 mg | INTRAMUSCULAR | Status: DC | PRN

## 2024-05-11 MED ORDER — ONABOTULINUMTOXINA 100 UNITS IJ SOLR
INTRAMUSCULAR | Status: AC
  Filled 2024-05-11: qty 200

## 2024-05-11 MED ORDER — MIDAZOLAM HCL (PF) 2 MG/2ML IJ SOLN
INTRAMUSCULAR | Status: DC | PRN
  Administered 2024-05-11: 2 mg via INTRAVENOUS

## 2024-05-11 MED ORDER — AMISULPRIDE (ANTIEMETIC) 5 MG/2ML IV SOLN: 10.0000 mg | Freq: Once | INTRAVENOUS | Status: DC | PRN

## 2024-05-11 MED ORDER — MIDAZOLAM HCL 2 MG/2ML IJ SOLN
INTRAMUSCULAR | Status: AC
  Filled 2024-05-11: qty 2

## 2024-05-11 MED ORDER — LACTATED RINGERS IV SOLN: INTRAVENOUS | Status: DC

## 2024-05-11 MED ORDER — FENTANYL CITRATE (PF) 250 MCG/5ML IJ SOLN
INTRAMUSCULAR | Status: DC | PRN
  Administered 2024-05-11 (×2): 50 ug via INTRAVENOUS

## 2024-05-11 MED ORDER — OXYCODONE HCL 5 MG PO TABS: 5.0000 mg | ORAL_TABLET | Freq: Once | ORAL | Status: DC | PRN

## 2024-05-11 MED ORDER — ONABOTULINUMTOXINA 100 UNITS IJ SOLR
INTRAMUSCULAR | Status: DC | PRN
  Administered 2024-05-11: 200 [IU] via INTRAMUSCULAR

## 2024-05-11 MED ORDER — ORAL CARE MOUTH RINSE: 15.0000 mL | Freq: Once | OROMUCOSAL | Status: AC

## 2024-05-11 MED ORDER — SODIUM CHLORIDE (PF) 0.9 % IJ SOLN
INTRAMUSCULAR | Status: AC
  Filled 2024-05-11: qty 20

## 2024-05-11 MED ORDER — OXYCODONE HCL 5 MG/5ML PO SOLN: 5.0000 mg | Freq: Once | ORAL | Status: DC | PRN

## 2024-05-11 MED ORDER — FENTANYL CITRATE (PF) 250 MCG/5ML IJ SOLN
INTRAMUSCULAR | Status: AC
  Filled 2024-05-11: qty 5

## 2024-05-11 MED ORDER — DEXAMETHASONE SOD PHOSPHATE PF 10 MG/ML IJ SOLN
INTRAMUSCULAR | Status: DC | PRN
  Administered 2024-05-11: 10 mg via INTRAVENOUS

## 2024-05-11 MED ORDER — LIDOCAINE 2% (20 MG/ML) 5 ML SYRINGE
INTRAMUSCULAR | Status: DC | PRN
  Administered 2024-05-11: 100 mg via INTRAVENOUS

## 2024-05-11 MED ORDER — SODIUM CHLORIDE (PF) 0.9 % IJ SOLN
INTRAMUSCULAR | Status: DC | PRN
  Administered 2024-05-11: 20 mL

## 2024-05-11 MED ORDER — INSULIN ASPART 100 UNIT/ML IJ SOLN: 0.0000 [IU] | INTRAMUSCULAR | Status: DC | PRN

## 2024-05-11 MED ORDER — PROPOFOL 10 MG/ML IV BOLUS
INTRAVENOUS | Status: AC
  Filled 2024-05-11: qty 20

## 2024-05-11 MED ORDER — STERILE WATER FOR IRRIGATION IR SOLN
Status: DC | PRN
  Administered 2024-05-11: 1000 mL

## 2024-05-11 MED ORDER — CHLORHEXIDINE GLUCONATE 0.12 % MT SOLN
15.0000 mL | Freq: Once | OROMUCOSAL | Status: AC
  Administered 2024-05-11: 15 mL via OROMUCOSAL

## 2024-05-11 MED ADMIN — PROPOFOL 200 MG/20ML IV EMUL: 200 mg | INTRAVENOUS | NDC 00069020910

## 2024-05-11 SURGICAL SUPPLY — 13 items
BAG URO CATCHER STRL LF (MISCELLANEOUS) ×1 IMPLANT
CLOTH BEACON ORANGE TIMEOUT ST (SAFETY) ×1 IMPLANT
GLOVE BIO SURGEON STRL SZ7.5 (GLOVE) ×1 IMPLANT
GOWN STRL REUS W/ TWL XL LVL3 (GOWN DISPOSABLE) ×1 IMPLANT
KIT TURNOVER KIT A (KITS) ×1 IMPLANT
MANIFOLD NEPTUNE II (INSTRUMENTS) ×1 IMPLANT
NDL ASPIRATION 22 (NEEDLE) ×1 IMPLANT
NDL SAFETY ECLIPSE 18X1.5 (NEEDLE) IMPLANT
NEEDLE ASPIRATION 22 (NEEDLE) ×1 IMPLANT
PACK CYSTO (CUSTOM PROCEDURE TRAY) ×1 IMPLANT
SYR CONTROL 10ML LL (SYRINGE) IMPLANT
TUBING CONNECTING 10 (TUBING) IMPLANT
WATER STERILE IRR 3000ML UROMA (IV SOLUTION) ×1 IMPLANT

## 2024-05-11 NOTE — Transfer of Care (Signed)
 Immediate Anesthesia Transfer of Care Note  Patient: Gavin Smith  Procedure(s) Performed: CYSTOSCOPY, WITH INJECTION OF BLADDER NECK OR BLADDER WALL (Bladder)  Patient Location: PACU  Anesthesia Type:General  Level of Consciousness: drowsy  Airway & Oxygen  Therapy: Patient Spontanous Breathing and Patient connected to face mask oxygen   Post-op Assessment: Report given to RN and Post -op Vital signs reviewed and stable  Post vital signs: Reviewed and stable  Last Vitals:  Vitals Value Taken Time  BP 119/71 05/11/24 13:21  Temp    Pulse 64 05/11/24 13:27  Resp 12 05/11/24 13:27  SpO2 100 % 05/11/24 13:27  Vitals shown include unfiled device data.  Last Pain:  Vitals:   05/11/24 1019  TempSrc: Oral  PainSc: 0-No pain         Complications: No notable events documented.

## 2024-05-11 NOTE — Anesthesia Preprocedure Evaluation (Addendum)
 Anesthesia Evaluation  Patient identified by MRN, date of birth, ID band Patient awake    Reviewed: Allergy & Precautions, H&P , NPO status , Patient's Chart, lab work & pertinent test results  Airway Mallampati: II  TM Distance: >3 FB Neck ROM: Full    Dental no notable dental hx. (+) Teeth Intact, Dental Advisory Given   Pulmonary asthma , sleep apnea    Pulmonary exam normal breath sounds clear to auscultation       Cardiovascular hypertension, Pt. on medications  Rhythm:Regular Rate:Normal     Neuro/Psych  Headaches   Depression     negative psych ROS   GI/Hepatic negative GI ROS, Neg liver ROS,,,  Endo/Other  diabetes, Insulin  Dependent  Class 3 obesity  Renal/GU negative Renal ROS  negative genitourinary   Musculoskeletal  (+) Arthritis ,    Abdominal  (+) + obese  Peds negative pediatric ROS (+)  Hematology negative hematology ROS (+)   Anesthesia Other Findings   Reproductive/Obstetrics negative OB ROS                              Anesthesia Physical Anesthesia Plan  ASA: 3  Anesthesia Plan: General   Post-op Pain Management:    Induction: Intravenous  PONV Risk Score and Plan: 2 and Ondansetron , Midazolam  and Treatment may vary due to age or medical condition  Airway Management Planned: LMA  Additional Equipment:   Intra-op Plan:   Post-operative Plan: Extubation in OR  Informed Consent: I have reviewed the patients History and Physical, chart, labs and discussed the procedure including the risks, benefits and alternatives for the proposed anesthesia with the patient or authorized representative who has indicated his/her understanding and acceptance.     Dental advisory given  Plan Discussed with: CRNA  Anesthesia Plan Comments:         Anesthesia Quick Evaluation

## 2024-05-11 NOTE — H&P (Signed)
 H&P  Chief Complaint: neurogenic bladder  History of Present Illness: 48 YO F with neurogenic bladder managed with botox  and SPT here for repeat botox .  Past Medical History:  Diagnosis Date   Allergic rhinitis    Asperger's syndrome 06/26/2019   Asthma exacerbation 07/22/2016   Back pain    Bilateral swelling of feet    Carpal tunnel syndrome, bilateral 06/27/2020   Chest pain    Colostomy in place [facility])    Depression    Diabetic gastropathy (HCC)    Diabetic neuropathy, type II diabetes mellitus (HCC)    Diabetic peripheral neuropathy (HCC) 07/15/2015   Encounter for postoperative care 03/12/2021   Essential hypertension, benign    Extrinsic asthma, unspecified    Family history of breast cancer gene mutation in first degree relative 05/29/2019   Fatty liver    Food allergy    Food intolerance    Gender dysphoria 12/19/2018   Formatting of this note might be different from the original. Trans Male Assessment and Plan  Pronoun Use: he/him Preferred Name: Gavin Smith Preferred Anatomical Terminology: scientific Legal Documentation Corrected: None  Hormone Start Date: Dec 13, 2018 Current Testosterone  Dose: 50 mg cypionate Mode of Delivery (SQ vs IM): IM Injection Schedule (Weekly?  Twice a week?): weekly  Recommended Monitoring S   Generalized osteoarthrosis, involving multiple sites    Headache    Heart murmur    History of colostomy    Hormone replacement therapy    HTN (hypertension), benign    Hyperlipidemia    IBS (irritable bowel syndrome)    IDDM (insulin  dependent diabetes mellitus) 06/16/2018   Inappropriate sinus tachycardia    Incisional hernia with obstruction but no gangrene 07/22/2017   Insomnia, unspecified    Joint pain    Lactose intolerance    Liver problem    Lumbar spinal stenosis 07/15/2015   Morbid obesity with BMI of 45.0-49.9, adult (HCC)    Muscular dystrophy (HCC) 07/09/2021   Formatting of this note might be different from the original. diagnosed age  48 yrs   Neurogenic bladder    Neuropathy 02/27/2015   Other hyperlipidemia    Palpitation    Pityriasis rosea    Pure hypercholesterolemia    Pure hypercholesterolemia    Sleep apnea    SOB (shortness of breath)    Swallowing problem    Type 2 diabetes, controlled, with peripheral neuropathy (HCC)    Ulnar neuritis, left    Urinary retention with incomplete bladder emptying    Vitamin D  deficiency    Vitamin D  deficiency    Past Surgical History:  Procedure Laterality Date   ABDOMINAL HYSTERECTOMY     BREAST REDUCTION SURGERY  07/27/2003   CHOLECYSTECTOMY  07/26/1998   COLOSTOMY  10/2014   CYSTOSCOPY WITH INJECTION N/A 11/21/2023   Procedure: CYSTOSCOPY, WITH INJECTION OF BLADDER NECK OR BLADDER WALL;  Surgeon: Carolee Sherwood JONETTA DOUGLAS, MD;  Location: [facility];  Service: Urology;  Laterality: N/A;  200 UNITS OF BOTOX    IR CATHETER TUBE CHANGE  08/31/2021   PARASTOMAL HERNIA REPAIR     2018,2019   RHINOPLASTY  07/27/2003   SEPTOPLASTY     2007   SPINAL FUSION  07/26/2004   L5-S1 spinal fusion   SUPRAPUBIC CATHETER PLACEMENT     2022   TRIGGER FINGER RELEASE     thumb 2021. index 2024    Home Medications:  Medications Prior to Admission  Medication Sig Dispense Refill Last Dose/Taking   Accu-Chek FastClix  Lancets MISC USE TO CHECK BLOOD SUGAR 4 TIMES A DAY 300 each 3 Past Week   AIMOVIG 70 MG/ML SOAJ 70 mg by Subconjunctival route every 28 (twenty-eight) days.   Past Week   albuterol  (PROVENTIL ) (2.5 MG/3ML) 0.083% nebulizer solution Take 2.5 mg by nebulization every 6 (six) hours as needed for wheezing.    05/10/2024   albuterol  (VENTOLIN  HFA) 108 (90 Base) MCG/ACT inhaler Inhale 1 puff into the lungs every 4 (four) hours as needed for wheezing.    Past Week   atorvastatin (LIPITOR) 20 MG tablet Take 20 mg by mouth every evening.   05/10/2024   budesonide -formoterol (SYMBICORT) 80-4.5 MCG/ACT inhaler Inhale 2 puffs into the lungs in the morning.   05/10/2024   Cholecalciferol  (VITAMIN D -3) 125 MCG (5000 UT) TABS Take 50,000 Units by mouth daily. 30000 tablet 0 Past Week   desloratadine (CLARINEX) 5 MG tablet Take 5 mg by mouth every evening.   Past Week   doxycycline (VIBRAMYCIN) 100 MG capsule Take 100 mg by mouth daily.   05/10/2024 Morning   DULoxetine (CYMBALTA) 30 MG capsule Take 30 mg by mouth every evening.   05/10/2024 Evening   EPINEPHrine 0.3 mg/0.3 mL IJ SOAJ injection Inject 0.3 mg into the muscle as needed for anaphylaxis.   Taking As Needed   fluticasone (FLONASE) 50 MCG/ACT nasal spray Place 2 sprays into both nostrils in the morning and at bedtime.   Unknown   Glucagon  (BAQSIMI  ONE PACK) 3 MG/DOSE POWD Place 3 mg into the nose as needed. 1 each 1 Unknown   glucose blood (ACCU-CHEK GUIDE TEST) test strip USE FOUR TIMES DAILY AS INSTRUCTED 300 strip 3 Past Week   Insulin  Disposable Pump (OMNIPOD 5 [id number] PODS GEN 5) MISC Change pod every 2 days as directed. 45 each 3 Past Week   insulin  lispro (HUMALOG ) 100 UNIT/ML injection Inject 0.7-0.9 mLs (70-90 Units total) into the skin daily. 30 mL 11 Taking   Insulin  Pen Needle (B-D ULTRAFINE III SHORT PEN) 31G X 8 MM MISC USE 8 TIMES DAILY AS DIRECTED 500 each 3 Past Week   ipratropium (ATROVENT ) 0.03 % nasal spray Place 2 sprays into both nostrils every evening.   Past Week   LINZESS 145 MCG CAPS capsule Take 145 mcg by mouth daily as needed (constipation).   Past Week   Multiple Vitamin (MULTIVITAMIN WITH MINERALS) TABS tablet Take 1 tablet by mouth every evening.   Past Week   MYRBETRIQ 50 MG TB24 tablet Take 50 mg by mouth every evening.   Past Week   Olopatadine HCl 0.2 % SOLN Place 1 drop into both eyes 2 (two) times daily as needed (allergies).   Past Week   omega-3 acid ethyl esters (LOVAZA) 1 g capsule Take 2 capsules by mouth 2 (two) times daily.   Past Week   PULMICORT  0.5 MG/2ML nebulizer solution Take 2 mLs by nebulization daily as needed for wheezing.   Past Week   QUVIVIQ 50 MG TABS Take 50 mg by  mouth at bedtime.   Past Week   rizatriptan (MAXALT-MLT) 10 MG disintegrating tablet Take 10 mg by mouth as needed for headache or migraine.   Past Week   testosterone  cypionate (DEPOTESTOSTERONE CYPIONATE) 200 MG/ML injection Inject 100 mg into the muscle every Wednesday.   Past Week   tiZANidine  (ZANAFLEX ) 4 MG tablet Take 4 mg by mouth every evening.   Past Week   torsemide (DEMADEX) 20 MG tablet Take 20 mg by mouth 2 (  two) times daily.   Past Week   traMADol  (ULTRAM ) 50 MG tablet Take 50-100 mg by mouth every 8 (eight) hours as needed for severe pain. 100 mg every night at bedtime   Past Week   Vitamin D , Ergocalciferol , (DRISDOL ) 1.25 MG (50000 UNIT) CAPS capsule Take 1 capsule (50,000 Units total) by mouth every other day. (Patient taking differently: Take 50,000 Units by mouth daily.) 45 capsule 3 Taking Differently   Allergies:  Allergies  Allergen Reactions   Fenofibrate  Other (See Comments)    Severe leg pain   Iodinated Contrast Media    Shellfish Allergy    Morphine And Codeine Rash    Family History  Problem Relation Age of Onset   Anxiety disorder Mother    Heart disease Mother    Diabetes Mother    Hypertension Mother    Hyperlipidemia Mother    Bipolar disorder Mother    Hypertension Father    Hyperlipidemia Father    Diabetes Maternal Grandmother    Cancer Maternal Grandmother        Breast Cancer   Hypertension Maternal Grandfather    Heart disease Maternal Grandfather    Heart disease Paternal Grandfather    Cancer Other        Ovarian Cancer-Grandparent   Social History:  reports that he has never smoked. He has been exposed to tobacco smoke. He has never used smokeless tobacco. He reports that he does not drink alcohol and does not use drugs.  ROS: A complete review of systems was performed.  All systems are negative except for pertinent findings as noted. ROS   Physical Exam:  Vital signs in last 24 hours: Temp:  [98.2 F (36.8 C)] 98.2 F (36.8  C) (10/17 1019) Pulse Rate:  [91] 91 (10/17 1019) Resp:  [18] 18 (10/17 1019) BP: (135)/(90) 135/90 (10/17 1019) SpO2:  [99 %] 99 % (10/17 1019) Weight:  [127 kg] 127 kg (10/17 1019) General:  Alert and oriented, No acute distress HEENT: Normocephalic, atraumatic Neck: No JVD or lymphadenopathy Cardiovascular: Regular rate and rhythm Lungs: Regular rate and effort Abdomen: Soft, nontender, nondistended, no abdominal masses Back: No CVA tenderness Extremities: No edema Neurologic: Grossly intact  Laboratory Data:  Results for orders placed or performed during the hospital encounter of 05/11/24 (from the past 24 hours)  Glucose, capillary     Status: Abnormal   Collection Time: 05/11/24 10:17 AM  Result Value Ref Range   Glucose-Capillary 132 (H) 70 - 99 mg/dL  Glucose, capillary     Status: Abnormal   Collection Time: 05/11/24 12:11 PM  Result Value Ref Range   Glucose-Capillary 109 (H) 70 - 99 mg/dL   No results found for this or any previous visit (from the past 240 hours). Creatinine: No results for input(s): CREATININE in the last 168 hours.  Impression/Assessment:  Neurogenic bladder  Plan:  Proceed with botox   Sherwood JONETTA Edison, III 05/11/2024, 12:27 PM

## 2024-05-11 NOTE — Op Note (Signed)
 Operative Note   Preoperative diagnosis:  1.  Detrusor overactivity   Postoperative diagnosis: 1.  Detrusor overactivity   Procedure(s): 1.  Cystoscopy with 200 units of intradetrusor Botox  2.  Simple suprapubic tube exchange   Surgeon: Sherwood Edison, MD   Assistants: None   Anesthesia: General   Complications: None immediate   EBL: Minimal   Specimens: 1.  None   Drains/Catheters: 1.  18 French Foley catheter as a suprapubic tube   Intraoperative findings: Normal urethra and bladder   Indication: 48 year old male transgender patient with urinary retention and detrusor overactivity managed with Foley catheter presents for intra detrusor Botox .   Description of procedure:   The patient was identified and consent was obtained.  The patient was taken to the operating room and placed in the supine position.  The patient was placed under general anesthesia.  Perioperative antibiotics were administered.  The patient was placed in dorsal lithotomy.  Patient was prepped and draped in a standard sterile fashion and a timeout was performed.   A rigid cystoscope was advanced into the urethra and into the bladder.  200 units of Botox  was systematically injected throughout the bladder.  There was no significant active bleeding noted.  I exchanged the suprapubic tube for another 18 French catheter.  This concluded the operation.  Patient tolerated the procedure well was stable postoperatively.   Plan: Repeat Botox  in 6 months once it starts to wear off

## 2024-05-11 NOTE — Anesthesia Procedure Notes (Signed)
 Procedure Name: LMA Insertion Date/Time: 05/11/2024 12:50 PM  Performed by: Hedy Jarred, CRNAPre-anesthesia Checklist: Patient identified, Emergency Drugs available, Suction available and Patient being monitored Patient Re-evaluated:Patient Re-evaluated prior to induction Oxygen  Delivery Method: Circle System Utilized Preoxygenation: Pre-oxygenation with 100% oxygen  Induction Type: IV induction Ventilation: Mask ventilation without difficulty LMA: LMA inserted LMA Size: 4.0 Number of attempts: 1 Airway Equipment and Method: Bite block Placement Confirmation: positive ETCO2 Tube secured with: Tape Dental Injury: Teeth and Oropharynx as per pre-operative assessment

## 2024-05-11 NOTE — Discharge Instructions (Addendum)
 You may notice some blood in the urine.  This is normal as long as your catheter is draining.  Call or go to the emergency department for fever over 101.

## 2024-05-11 NOTE — Anesthesia Postprocedure Evaluation (Signed)
 Anesthesia Post Note  Patient: Gavin Smith  Procedure(s) Performed: CYSTOSCOPY, WITH INJECTION OF BLADDER NECK OR BLADDER WALL (Bladder)     Patient location during evaluation: PACU Anesthesia Type: General Level of consciousness: awake and alert Pain management: pain level controlled Vital Signs Assessment: post-procedure vital signs reviewed and stable Respiratory status: spontaneous breathing, nonlabored ventilation and respiratory function stable Cardiovascular status: blood pressure returned to baseline and stable Postop Assessment: no apparent nausea or vomiting Anesthetic complications: no   No notable events documented.  Last Vitals:  Vitals:   05/11/24 1345 05/11/24 1400  BP: 133/79 125/84  Pulse: 80 75  Resp: 11 12  Temp:  (!) 36.4 C  SpO2: 100% 100%    Last Pain:  Vitals:   05/11/24 1400  TempSrc:   PainSc: 0-No pain                 Butler Levander Pinal

## 2024-05-12 ENCOUNTER — Encounter (HOSPITAL_COMMUNITY): Payer: Self-pay | Admitting: Urology

## 2024-05-21 ENCOUNTER — Encounter: Payer: Self-pay | Admitting: Internal Medicine

## 2024-05-21 ENCOUNTER — Ambulatory Visit: Admitting: Internal Medicine

## 2024-05-21 VITALS — BP 120/70 | HR 79 | Ht 67.0 in | Wt 287.8 lb

## 2024-05-21 DIAGNOSIS — E1142 Type 2 diabetes mellitus with diabetic polyneuropathy: Secondary | ICD-10-CM | POA: Diagnosis not present

## 2024-05-21 DIAGNOSIS — E559 Vitamin D deficiency, unspecified: Secondary | ICD-10-CM | POA: Diagnosis not present

## 2024-05-21 DIAGNOSIS — E7849 Other hyperlipidemia: Secondary | ICD-10-CM | POA: Diagnosis not present

## 2024-05-21 DIAGNOSIS — Z794 Long term (current) use of insulin: Secondary | ICD-10-CM | POA: Diagnosis not present

## 2024-05-21 LAB — POCT GLYCOSYLATED HEMOGLOBIN (HGB A1C): Hemoglobin A1C: 6.4 % — AB (ref 4.0–5.6)

## 2024-05-21 MED ORDER — VITAMIN D (ERGOCALCIFEROL) 1.25 MG (50000 UNIT) PO CAPS: 50000.0000 [IU] | ORAL_CAPSULE | Freq: Every day | ORAL | Status: DC

## 2024-05-21 NOTE — Patient Instructions (Addendum)
 Please change the pump settings as follows: - Basal rates: 12 am: 1.65 >> 1.80 3 am: 1.65 units/h - Insulin  to carb ratio: 12 am: 1:10 >> 8 5 pm: 1:10 >> 8 - Target: 12 am: 110-130 >> 110-120 - Correction factor (insulin  sensitivity factor):  12 am: 1:11 - Active insulin  time: 4h  Please increase the Ergocalciferol  to 50,000 units daily.  Please return in 6 months.

## 2024-05-21 NOTE — Progress Notes (Signed)
 Patient ID: Gavin Smith, adult   DOB: Mar 19, 1976, 48 y.o.   MRN: [id number]  HPI: Gavin Smith is a 48 y.o.-year-old adult, initially referred by his PCP, Dr. Gunnar Free, presenting for follow-up for DM2, dx in 46 (48 y/o), insulin -dependent, uncontrolled, wit complications (PN). Last visit 6 months ago.   He is here with his girlfriend who is an [employer].  Patient is also studying to become an [employer].  Interim history:   Last year, he started to walk with his girlfriend-walking 4 to 5 miles a day and completing all the circles on his watch. They also joined [employer] together. He lost more than 60 pounds in the last 3 years. He lost 26 lbs before last OV.  He gained 2 pounds since last visit. No increased urination, blurry vision, nausea, chest pain. He has neurogenic bladder and had to have another cystoscopy since last visit.  DM2: Reviewed history: He has a h/o autonomic neuropathy of the GI tract (dx in 1986, when he was 49 y/o - not related to DM - severe diarrhea: food goes through me), [place]. [place] >> permanent colostomy - had sx in 09/2014.   Reviewed HbA1c levels: Lab Results  Component Value Date   HGBA1C 6.0 (H) 02/23/2024   HGBA1C 6.4 (H) 11/18/2023   HGBA1C 7.4 (A) 06/02/2023  03/17/2022: HbA1c 6.7% 11/18/2020: HbA1c 6.9%02/20/2020: HbA1c 6.0%. 10/19/2019: HbA1c 6.7% 12/07/2018: HbA1c 6.8% 11/10/2016:The HbA1c calculated from the fructosamine was 7.3%, better than the measured HbA1c! 02/28/2015: HbA1c 6.9% 10/21/2014: HbA1c 6.9% - after changing from Humalog  to NovoLog   07/23/2014: HbA1c 10.9% ICA Abs negative (01/31/2012) C-pp 5.0, Glu 218 (01/25/2012)    Previously on: - Lantus  20 units 2x a day >> 50 units daily at night - NovoLog  >> Lyumjev  >> Humalog  150-175: 14 units (7 clicks) 175-200: 18 units (9 clicks) 200-230: 20 units (10 clicks) 230-and higher: 34 units (17 clicks) He cannot digest pills;  tried oral meds in the past >> they went through me. He also  tried Lantus  >> this did not work for him >> sugars unchanged despite increasing the dose.  He tried Tresiba  and also Lyumjev  but sugars were higher. CeQur simplicity insulin  pump was not covered by his insurance.   He had high HbA1c levels in the past as his insurance did not approve NovoLog  and was forced to change to Humalog /Humulin.  Since then, we need to send a preauthorization for NovoLog  to his insurance every year.  Pump: - OmniPod 5 - started 05/2023  CGM: - Dexcom G7  Insulin : - Humalog   Supplies: - [employer]  Insulin  pump settings: - Basal rates: 12 am: 1.65 units/h - Insulin  to carb ratio: 12 am: 1:10 5 pm: 1:10 (did not change to 1: 8 as advised at last visit as he forgot) - Target: 12 am: 110-130 - Correction factor (insulin  sensitivity factor):  12 am: 1:11 - Active insulin  time: 4h  Total daily dose from basal insulin : 51% (34.5 units) >> 48% (39.4 units) Total daily dose from bolus insulin : 49% (32.8 units) >> 52% (42.8 units) TDD: 82-120 units  He checks her sugars more than 4 times a day with the CGM:  Previously:  Previously:   Lowest blood sugar: 42, 59 ...>> 60s  >> 60s. She has hypoglycemia awareness in the 60s. Highest sugars recently 400 (after breast sx)... >> 200s >> 300s.  -No CKD: Lab Results  Component Value Date   BUN 16 04/24/2024   Lab Results  Component  Value Date   CREATININE 1.00 04/24/2024   No results found for: MICRALBCREAT For now, off Lisinopril .  -+ HL; last set of lipids: Lab Results  Component Value Date   CHOL 158 02/23/2024   HDL 47 02/23/2024   LDLCALC 72 02/23/2024   LDLDIRECT 119.0 07/15/2017   TRIG 237 (H) 02/23/2024   CHOLHDL 3.4 02/23/2024  03/17/2022: 172/147/57/90 10/29/2021: 162/127/57/83 02/20/2020: 145/173/47/75 10/19/2019: 149/155/50/72 02/03/2016: 205/284/55/93 07/02/2015: 206/229/63/97  07/23/2014: 324/868/42/171 AST/ALT: 121/76 He was taken off statins due to hemochromatosis >> now restarted  Lipitor 10 mg daily. She had muscle aches on fenofibrate .  Currently on omega-3 fatty acids.  - last eye exam was in 2024: No DR reportedly [facility]).   -+ Numbness and tingling in his feet - and L hand numbness and pain.  He sees podiatry and neurology.  On Neurontin cream and Cymbalta - then restarted Cymbalta >> stopped again b/c ineffective. On Tramadol  for nerve pain. He saw pain clinic but cannot return until stops Lunesta.  Last foot exam was in 05/2023.  Vitamin D  def.:  Reviewed vitamin D  levels: Lab Results  Component Value Date   VD25OH 24.8 (L) 02/23/2024   VD25OH 64.54 09/15/2018   VD25OH 31.25 07/15/2017   VD25OH 45.42 11/10/2016   VD25OH 35.30 05/31/2016   VD25OH 23.86 (L) 08/12/2015   VD25OH 19.32 (L) 11/28/2014  11/26/2022: vitamin D  94.9 - he changed ergocalciferol  to qod 03/17/2022: Vitamin D  51.6 11/18/2021 vitamin D  29.6 - ran out of ergocalciferol  10/29/2020: Vitamin D  55.9 02/20/2020: Vitamin D  47.9 Vit D 23.5 in 02/03/2016 - on Ergocalciferol  50,000 IU daily. Vit D 13.9 in 06/2014 - despite being on Ergocalciferol  50,000 IU 2x a week. Vit D 19.3 in 11/2014 - repeat, on Ergocalciferol  50,000 IU 2x a week >> dose increased to 4 times a week Vit D 23.86 in 07/2015 - on ergocalciferol  50,000 IU 4x a week >> dose increased to daily  Currently on ergocalciferol  50,000 units, now every other day.  Latest TSH was normal: Lab Results  Component Value Date   TSH 1.380 02/23/2024   He continues on testosterone  for male to male transition.  He felt much better after starting this.  Had B mastectomy in 06/2019.  This was an extensive surgery.  He also had hysterectomy in 02/2021.  He started to take testosterone  sq instead of im >> liver tests and testosterone  levels improved.  He also has hemochromatosis, allergy-triggered asthma, HTN, OSA-on CPAP, transaminitis, GERD, depression.  He  is on Depo-Provera. Before our visit at the end of 2022, he eliminated starches  (bread, rice, pasta) and diet sodas. He lost 31 lbs overall per scale at home. He has rosacea >> was on Bactrim for many years >> not helpful anymore >> on antibiotics per dermatology.  ROS: + See HPI  I reviewed pt's medications, allergies, PMH, social hx, family hx, and changes were documented in the history of present illness. Otherwise, unchanged from my initial visit note.  Past Medical History:  Diagnosis Date   Allergic rhinitis    Asperger's syndrome 06/26/2019   Asthma exacerbation 07/22/2016   Back pain    Bilateral swelling of feet    Carpal tunnel syndrome, bilateral 06/27/2020   Chest pain    Colostomy in place [facility])    Depression    Diabetic gastropathy (HCC)    Diabetic neuropathy, type II diabetes mellitus (HCC)    Diabetic peripheral neuropathy (HCC) 07/15/2015   Encounter for postoperative care 03/12/2021  Essential hypertension, benign    Extrinsic asthma, unspecified    Family history of breast cancer gene mutation in first degree relative 05/29/2019   Fatty liver    Food allergy    Food intolerance    Gender dysphoria 12/19/2018   Formatting of this note might be different from the original. Trans Male Assessment and Plan  Pronoun Use: he/him Preferred Name: JP Preferred Anatomical Terminology: scientific Legal Documentation Corrected: None  Hormone Start Date: Dec 13, 2018 Current Testosterone  Dose: 50 mg cypionate Mode of Delivery (SQ vs IM): IM Injection Schedule (Weekly?  Twice a week?): weekly  Recommended Monitoring S   Generalized osteoarthrosis, involving multiple sites    Headache    Heart murmur    History of colostomy    Hormone replacement therapy    HTN (hypertension), benign    Hyperlipidemia    IBS (irritable bowel syndrome)    IDDM (insulin  dependent diabetes mellitus) 06/16/2018   Inappropriate sinus tachycardia    Incisional hernia with obstruction but no gangrene 07/22/2017   Insomnia, unspecified    Joint pain    Lactose  intolerance    Liver problem    Lumbar spinal stenosis 07/15/2015   Morbid obesity with BMI of 45.0-49.9, adult (HCC)    Muscular dystrophy (HCC) 07/09/2021   Formatting of this note might be different from the original. diagnosed age 60 yrs   Neurogenic bladder    Neuropathy 02/27/2015   Other hyperlipidemia    Palpitation    Pityriasis rosea    Pure hypercholesterolemia    Pure hypercholesterolemia    Sleep apnea    SOB (shortness of breath)    Swallowing problem    Type 2 diabetes, controlled, with peripheral neuropathy (HCC)    Ulnar neuritis, left    Urinary retention with incomplete bladder emptying    Vitamin D  deficiency    Vitamin D  deficiency    Past Surgical History:  Procedure Laterality Date   ABDOMINAL HYSTERECTOMY     BREAST REDUCTION SURGERY  07/27/2003   CHOLECYSTECTOMY  07/26/1998   COLOSTOMY  10/2014   CYSTOSCOPY WITH INJECTION N/A 11/21/2023   Procedure: CYSTOSCOPY, WITH INJECTION OF BLADDER NECK OR BLADDER WALL;  Surgeon: Carolee Sherwood JONETTA DOUGLAS, MD;  Location: [facility];  Service: Urology;  Laterality: N/A;  200 UNITS OF BOTOX    CYSTOSCOPY WITH INJECTION N/A 05/11/2024   Procedure: CYSTOSCOPY, WITH INJECTION OF BLADDER NECK OR BLADDER WALL;  Surgeon: Carolee Sherwood JONETTA DOUGLAS, MD;  Location: [facility];  Service: Urology;  Laterality: N/A;  200 UNITS OF BOTOX    IR CATHETER TUBE CHANGE  08/31/2021   PARASTOMAL HERNIA REPAIR     2018,2019   RHINOPLASTY  07/27/2003   SEPTOPLASTY     2007   SPINAL FUSION  07/26/2004   L5-S1 spinal fusion   SUPRAPUBIC CATHETER PLACEMENT     2022   TRIGGER FINGER RELEASE     thumb 2021. index 2024   History   Social History   Marital Status: Single    Spouse Name: N/A   Number of Children: 0   Years of Education: 14   Occupational History   Prev. EMS, now disabled   Social History Main Topics   Smoking status: Never Smoker    Smokeless tobacco: Not on file   Alcohol Use: No   Drug Use: No   Current Outpatient Medications on  File Prior to Visit  Medication Sig Dispense Refill   Accu-Chek FastClix Lancets MISC USE TO CHECK  BLOOD SUGAR 4 TIMES A DAY 300 each 3   AIMOVIG 70 MG/ML SOAJ 70 mg by Subconjunctival route every 28 (twenty-eight) days.     albuterol  (PROVENTIL ) (2.5 MG/3ML) 0.083% nebulizer solution Take 2.5 mg by nebulization every 6 (six) hours as needed for wheezing.      albuterol  (VENTOLIN  HFA) 108 (90 Base) MCG/ACT inhaler Inhale 1 puff into the lungs every 4 (four) hours as needed for wheezing.      atorvastatin (LIPITOR) 20 MG tablet Take 20 mg by mouth every evening.     budesonide -formoterol (SYMBICORT) 80-4.5 MCG/ACT inhaler Inhale 2 puffs into the lungs in the morning.     Cholecalciferol (VITAMIN D -3) 125 MCG (5000 UT) TABS Take 50,000 Units by mouth daily. 30000 tablet 0   desloratadine (CLARINEX) 5 MG tablet Take 5 mg by mouth every evening.     doxycycline (VIBRAMYCIN) 100 MG capsule Take 100 mg by mouth daily.     DULoxetine (CYMBALTA) 30 MG capsule Take 30 mg by mouth every evening.     EPINEPHrine 0.3 mg/0.3 mL IJ SOAJ injection Inject 0.3 mg into the muscle as needed for anaphylaxis.     fluticasone (FLONASE) 50 MCG/ACT nasal spray Place 2 sprays into both nostrils in the morning and at bedtime.     Glucagon  (BAQSIMI  ONE PACK) 3 MG/DOSE POWD Place 3 mg into the nose as needed. 1 each 1   glucose blood (ACCU-CHEK GUIDE TEST) test strip USE FOUR TIMES DAILY AS INSTRUCTED 300 strip 3   Insulin  Disposable Pump (OMNIPOD 5 [id number] PODS GEN 5) MISC Change pod every 2 days as directed. 45 each 3   insulin  lispro (HUMALOG ) 100 UNIT/ML injection Inject 0.7-0.9 mLs (70-90 Units total) into the skin daily. 30 mL 11   Insulin  Pen Needle (B-D ULTRAFINE III SHORT PEN) 31G X 8 MM MISC USE 8 TIMES DAILY AS DIRECTED 500 each 3   ipratropium (ATROVENT ) 0.03 % nasal spray Place 2 sprays into both nostrils every evening.     LINZESS 145 MCG CAPS capsule Take 145 mcg by mouth daily as needed (constipation).      Multiple Vitamin (MULTIVITAMIN WITH MINERALS) TABS tablet Take 1 tablet by mouth every evening.     MYRBETRIQ 50 MG TB24 tablet Take 50 mg by mouth every evening.     Olopatadine HCl 0.2 % SOLN Place 1 drop into both eyes 2 (two) times daily as needed (allergies).     omega-3 acid ethyl esters (LOVAZA) 1 g capsule Take 2 capsules by mouth 2 (two) times daily.     PULMICORT  0.5 MG/2ML nebulizer solution Take 2 mLs by nebulization daily as needed for wheezing.     QUVIVIQ 50 MG TABS Take 50 mg by mouth at bedtime.     rizatriptan (MAXALT-MLT) 10 MG disintegrating tablet Take 10 mg by mouth as needed for headache or migraine.     testosterone  cypionate (DEPOTESTOSTERONE CYPIONATE) 200 MG/ML injection Inject 100 mg into the muscle every Wednesday.     tiZANidine  (ZANAFLEX ) 4 MG tablet Take 4 mg by mouth every evening.     torsemide (DEMADEX) 20 MG tablet Take 20 mg by mouth 2 (two) times daily.     traMADol  (ULTRAM ) 50 MG tablet Take 50-100 mg by mouth every 8 (eight) hours as needed for severe pain. 100 mg every night at bedtime     Vitamin D , Ergocalciferol , (DRISDOL ) 1.25 MG (50000 UNIT) CAPS capsule Take 1 capsule (50,000 Units total) by mouth every other day. (  Patient taking differently: Take 50,000 Units by mouth daily.) 45 capsule 3   No current facility-administered medications on file prior to visit.   Allergies  Allergen Reactions   Fenofibrate  Other (See Comments)    Severe leg pain   Iodinated Contrast Media    Shellfish Allergy    Morphine And Codeine Rash   Family History  Problem Relation Age of Onset   Anxiety disorder Mother    Heart disease Mother    Diabetes Mother    Hypertension Mother    Hyperlipidemia Mother    Bipolar disorder Mother    Hypertension Father    Hyperlipidemia Father    Diabetes Maternal Grandmother    Cancer Maternal Grandmother        Breast Cancer   Hypertension Maternal Grandfather    Heart disease Maternal Grandfather    Heart disease  Paternal Grandfather    Cancer Other        Ovarian Cancer-Grandparent   PE: BP 120/70   Pulse 79   Ht 5' 7 (1.702 m)   Wt 287 lb 12.8 oz (130.5 kg)   SpO2 98%   BMI 45.08 kg/m   Wt Readings from Last 20 Encounters:  05/21/24 287 lb 12.8 oz (130.5 kg)  05/11/24 280 lb (127 kg)  05/02/24 280 lb (127 kg)  04/24/24 285 lb (129.3 kg)  04/12/24 286 lb 12.8 oz (130.1 kg)  04/04/24 278 lb (126.1 kg)  03/08/24 280 lb (127 kg)  02/23/24 284 lb (128.8 kg)  11/29/23 285 lb 12.8 oz (129.6 kg)  11/21/23 268 lb (121.6 kg)  11/18/23 268 lb (121.6 kg)  06/02/23 271 lb 3.2 oz (123 kg)  11/30/22 268 lb 3.2 oz (121.7 kg)  06/01/22 (!) 311 lb 12.8 oz (141.4 kg)  12/01/21 (!) 333 lb (151 kg)  08/31/21 (!) 332 lb (150.6 kg)  08/20/21 (!) 343 lb (155.6 kg)  07/24/21 (!) 333 lb (151 kg)  05/18/21 (!) 334 lb 12.8 oz (151.9 kg)  11/14/20 (!) 346 lb 12.8 oz (157.3 kg)    Constitutional: overweight, in NAD Eyes:  EOMI, no exophthalmos ENT: no neck masses, no cervical lymphadenopathy Cardiovascular: RRR, No MRG, + B arm swelling after lymphectomy after  mastectomy. Respiratory: CTA B Musculoskeletal: no deformities Skin:no rashes Neurological: no tremor with outstretched hands Diabetic Foot Exam - Simple   Simple Foot Form Diabetic Foot exam was performed with the following findings: Yes 05/21/2024  2:23 PM  Visual Inspection No deformities, no ulcerations, no other skin breakdown bilaterally: Yes Sensation Testing See comments: Yes Pulse Check Posterior Tibialis and Dorsalis pulse intact bilaterally: Yes Comments No sensation to monofilament B + B hallux onychodystrophy    ASSESSMENT: 1. DM2, insulin -dependent, uncontrolled, with complications - PN  - Patient had a lot of problems controlling his diabetes over the years due to unpredictability of retaining the food he ate in the setting of autonomic GI neuropathy.    Humalog  did not work for him >> sugars >200 constantly. Since he  cannot use Humalog , we sent and then re-sent a PA for NovoLog . NovoLog  was finally approved, but this form has to be sent every year!  2. Vit D def  PLAN:  1. Patient with improved control in the last several years, especially after starting the CGM and improving diet (nadir HbA1c of 5.7%).  At last visit, though, HbA1c was slightly higher, at 6.0%.  He was continuing to lose weight.  Reviewing the CGM trends, sugars were fluctuating in the upper  half of the target range during the day but increasing after lunch and more consistently after dinner.  He was sometimes not entering all of the carbs into the pump before meals and I advised him to do so.  We also discussed about strengthening his insulin  to carb ratio with dinner and, if sugars overnight remained elevated, to lower the target.  He was in manual mode only as he mentioned that he is so sugars in the 300s when he tried to the auto mode before.  I recommended again to try the auto mode.  However, he remains 100% in the manual mode. CGM interpretation: - At today's visit, we reviewed his CGM downloads: It appears that 56% of values are in target range (goal >70%), while 44% are higher than 180 (goal <25%), and 0% are lower than 70 (goal <4%).  The calculated average blood sugar is 186.  The projected HbA1c for the next 3 months (GMI) is 7.7%. -Reviewing the CGM trends, sugars appear to be higher during the night, peaking around 3 AM.  Upon questioning, he has at night and the sugars still increase after this despite covering this with insulin .  Occasionally, sugars are also higher of the consistently.  At today's visit we discussed about strengthening to carb ratios, since last visit.  I recommended to do this for over however, he may need to change his snack at night due to the significant hyperglycemia.  In the middle of the night.  I also recommended that he see his basal rates from 12 AM to 3 AM to cover this peak. - Reviewing his pump settings on  his maximum basal rate was actually set at the same value as the regular basal rate, 1.65 units an hour, which is counterproductive and does not allow the pump to give him more insulin  if needed.  This may not be important in the manual mode, but it would be crucial for the auto mode.  We went ahead and increase the maximum basal rate to 5 units an hour. - As his targets are higher than recommended at last visit, I advised him to tighten these - we also changed his mode from manual to auto mode today - I suggested to:  Patient Instructions  Please change the pump settings as follows: - Basal rates: 12 am: 1.65 >> 1.80 3 am: 1.65 units/h - Insulin  to carb ratio: 12 am: 1:10 >> 8 5 pm: 1:10 >> 8 - Target: 12 am: 110-130 >> 110-120 - Correction factor (insulin  sensitivity factor):  12 am: 1:11 - Active insulin  time: 4h  Please increase the Ergocalciferol  to 50,000 units daily.  Please return in 6 months.  - we checked his HbA1c: 6.4% (lower than expected from his blood sugars at home) - advised to check sugars at different times of the day - 4x a day, rotating check times - advised for yearly eye exams >> he is UTD - return to clinic in 6 months  2. HL - Reviewed latest lipid panel 01/2024: LDL improved, triglycerides elevated: Lab Results  Component Value Date   CHOL 158 02/23/2024   HDL 47 02/23/2024   LDLCALC 72 02/23/2024   LDLDIRECT 119.0 07/15/2017   TRIG 237 (H) 02/23/2024   CHOLHDL 3.4 02/23/2024  - Continues Lipitor 10 mg daily and omega-3 fatty acids without side effects.  He was not able to tolerate fenofibrate  in the past.  3. Vit D def -He was previously on high-dose vitamin D  50,000 units daily, but  this was switched to every other day after vitamin D  level returned close to the upper limit of the normal range in 11/2021 - I usually refill his ergocalciferol  prescriptions - Latest vitamin D  level was low on 02/23/2024: 24.8 - He mentions that the above level was  due to him taking the supplement every other day.  At today's visit. I advised him to increase the supplement to every day.  He needs a prescription for this but will let me know about his new mail-order pharmacy.  Lela Fendt, MD PhD [facility]

## 2024-05-21 NOTE — Addendum Note (Signed)
 Addended by: CLEOTILDE ROLIN RAMAN on: 05/21/2024 04:45 PM   Modules accepted: Orders

## 2024-05-30 ENCOUNTER — Ambulatory Visit (INDEPENDENT_AMBULATORY_CARE_PROVIDER_SITE_OTHER): Payer: Self-pay | Admitting: Family Medicine

## 2024-05-30 ENCOUNTER — Encounter (INDEPENDENT_AMBULATORY_CARE_PROVIDER_SITE_OTHER): Payer: Self-pay | Admitting: Family Medicine

## 2024-05-30 VITALS — BP 120/72 | HR 92 | Temp 98.7°F | Ht 67.0 in | Wt 282.0 lb

## 2024-05-30 DIAGNOSIS — I152 Hypertension secondary to endocrine disorders: Secondary | ICD-10-CM | POA: Diagnosis not present

## 2024-05-30 DIAGNOSIS — E1169 Type 2 diabetes mellitus with other specified complication: Secondary | ICD-10-CM

## 2024-05-30 DIAGNOSIS — E65 Localized adiposity: Secondary | ICD-10-CM

## 2024-05-30 DIAGNOSIS — Z7985 Long-term (current) use of injectable non-insulin antidiabetic drugs: Secondary | ICD-10-CM

## 2024-05-30 DIAGNOSIS — Z6841 Body Mass Index (BMI) 40.0 and over, adult: Secondary | ICD-10-CM

## 2024-05-30 DIAGNOSIS — E1159 Type 2 diabetes mellitus with other circulatory complications: Secondary | ICD-10-CM | POA: Diagnosis not present

## 2024-05-30 DIAGNOSIS — Z794 Long term (current) use of insulin: Secondary | ICD-10-CM

## 2024-05-30 MED ORDER — TIRZEPATIDE 2.5 MG/0.5ML ~~LOC~~ SOAJ: 2.5000 mg | SUBCUTANEOUS | 0 refills | Status: DC

## 2024-05-30 NOTE — Progress Notes (Signed)
 Gavin Smith, D.O.  ABFM, ABOM Specializing in Clinical Bariatric Medicine  Office located at: [address] [place], [place]  [address]    FOR THE CHRONIC DISEASE OF OBESITY:   Starting Morbid obesity (HCC) 44.47 BMI 40.0-44.9, adult (HCC) - current BMI 41.6  Weight Summary and Body Composition Analysis  Weight Lost Since Last Visit: 0lb  Weight Gained Since Last Visit: 2lb    Vitals Temp: 98.7 F (37.1 C) BP: 120/72 Pulse Rate: 92 SpO2: 99 %   Anthropometric Measurements Height: 5' 7 (1.702 m) Weight: 282 lb (127.9 kg) BMI (Calculated): 44.16 Weight at Last Visit: 280lb Weight Lost Since Last Visit: 0lb Weight Gained Since Last Visit: 2lb Starting Weight: 284lb Total Weight Loss (lbs): 2 lb (0.907 kg) Peak Weight: 376lb Waist Measurement : 51 inches   Body Composition  Body Fat %: 41.5 % Fat Mass (lbs): 117 lbs Muscle Mass (lbs): 156.8 lbs Total Body Water (lbs): 125 lbs Visceral Fat Rating : 26   Other Clinical Data Fasting: No Labs: no Today's Visit #: 5 Starting Date: 02/23/24    Chief complaint: Obesity  Interval History Gavin Smith is here for a follow-up office visit to discuss his progress with his obesity treatment plan. He is keeping a food journal and adhering to recommended goals of 1200 - 1300 calories and 85+ grams protein and states he is following his eating plan approximately 90 % of the time. He is doing NEAT 60  minutes 5-6 days per week  He has experienced a weight gain of 2 lbs since last OV on 05/02/2024.   His dietary and life habits include:  - Tracking Calories/Macros: yes - he rarely goes over in calories but is sometimes under.   - Eating More Whole Foods: yes  - Adequate Protein Intake: yes  - Adequate Water Intake: no  - Skipping Meals: yes, due to not feeling hungry   - Sleeping 7-9 Hours/ Night: no    05/02/24 14:00 05/30/24 11:00   Body Fat % 41.2 % 41.5 %  Muscle Mass (lbs) 157 lbs 156.8  lbs  Fat Mass (lbs) 115.6 lbs 117 lbs  Total Body Water (lbs) 123 lbs 125 lbs  Visceral Fat Rating  26 26   Counseling done on how various foods will affect these numbers and how to maximize success  Total Fat mass lost to date: - 0.2 lbs Total lbs lost to date: - 2 lbs Total weight loss percentage to date: - 0.70 %   Nutritional and Behavioral Counseling:  We discussed the following today: increasing lean protein intake to established goals, work on meal planning and preparation, work on managing stress, creating time for self-care and relaxation, continue to work on implementation of reduced calorie nutritional plan, and eating multiple small meals a day to get in all their foods  Additional resources provided today: Handout on NEAT  Evidence-based interventions for health behavior change were utilized today including the discussion of self monitoring techniques, problem-solving barriers and SMART goal setting techniques.   Regarding patient's less desirable eating habits and patterns, we employed the technique of small changes.   SMART Goal(s) created today: n/a   Recommended Dietary Goals Gavin Smith is currently in the action stage of change. As such, his goal is to continue weight management plan.  He has agreed to continue journaling 1200 - 1300 calories and 85+ grams protein   Recommended Physical Activity Goals Gavin Smith has been advised to work up to 300-450 minutes  of moderate intensity aerobic activity a week and strengthening exercises 2-3 times per week for cardiovascular health, weight loss maintenance and preservation of muscle mass.   He was encouraged to  Think about enjoyable ways to increase daily physical activity and overcoming barriers to exercise   Medical Interventions and Pharmacotherapy Previous Bariatric surgery: n/a Pharmacotherapy: See T2DM note.   OBESITY RELATED CONDITIONS ADDRESSED TODAY:    Meds ordered this encounter  Medications   tirzepatide  (MOUNJARO) 2.5 MG/0.5ML Pen    Sig: Inject 2.5 mg into the skin once a week.    Dispense:  2 mL    Refill:  0    Type 2 diabetes mellitus with other specified complication, with long-term current use of insulin  [facility]) Assessment & Plan: Lab Results  Component Value Date   HGBA1C 6.4 (A) 05/21/2024   HGBA1C 6.0 (H) 02/23/2024   HGBA1C 6.4 (H) 11/18/2023   INSULIN  2.7 02/23/2024    HgbA1c increased to 6.4%. He met with Dr. Trixie (endocrinology) on 10/27. At that visit, Dr. Trixie adjusted the patient's insulin  ratios due to blood glucose levels ranging from the high 100s to low 200s. Since the adjustment, blood sugars have been stable. Additionally, after messaging Dr.Gherghe, she okayed the use of Mounjaro.  Pt denies a personal history of pancreatitis;  and denies a family history of medullary thyroid  carcinoma or multiple endocrine neoplasia type II. After discussion of mechanisms of action, benefits, side effects and shared decision making he is agreeable to starting Mounjaro 2.5 mg weekly.  He will continue to focus on protein-rich, low simple carbohydrate foods. We reviewed the importance of hydration, stress management and restorative sleep.     Hypertension associated with diabetes [facility]) Assessment & Plan: BP Readings from Last 3 Encounters:  05/30/24 120/72  05/21/24 120/70  05/11/24 125/84   BP at goal today. No acute concerns. Cont Demadex 20 mg twice daily and low sodium nutritional plan. Advance exercise as tolerated.     Visceral obesity Assessment & Plan: Current visceral fat rating: 26.  The visceral fat rating should be  < 10 in a male. Visceral adipose tissue is a hormonally active component of total body fat. This body composition phenotype is associated with medical disorders such as metabolic syndrome, cardiovascular disease and several malignancies including prostate, breast, and colorectal cancers. Continue losing 7-10% of weight via prudent nutritional plan  and lifestyle changes.    Objective:   PHYSICAL EXAM: Blood pressure 120/72, pulse 92, temperature 98.7 F (37.1 C), height 5' 7 (1.702 m), weight 282 lb (127.9 kg), SpO2 99%. Body mass index is 44.17 kg/m.  General: he is overweight, cooperative and in no acute distress. PSYCH: Has normal mood, affect and thought process.   HEENT: EOMI, sclerae are anicteric. Lungs: Normal breathing effort, no conversational dyspnea. Extremities: Moves * 4 Neurologic: A and O * 3, good insight  DIAGNOSTIC DATA REVIEWED: BMET    Component Value Date/Time   NA 139 04/24/2024 1053   NA 141 02/23/2024 1223   K 4.0 04/24/2024 1053   CL 102 04/24/2024 1053   CO2 27 04/24/2024 1053   GLUCOSE 151 (H) 04/24/2024 1053   BUN 16 04/24/2024 1053   BUN 16 02/23/2024 1223   CREATININE 1.00 04/24/2024 1053   CREATININE 0.81 07/15/2017 1126   CALCIUM 9.7 04/24/2024 1053   GFRNONAA >60 04/24/2024 1053   GFRNONAA 91 07/15/2017 1126   GFRAA 106 07/15/2017 1126   Lab Results  Component Value Date   HGBA1C  6.4 (A) 05/21/2024   HGBA1C 7.6 (H) 05/28/2011   Lab Results  Component Value Date   INSULIN  2.7 02/23/2024   Lab Results  Component Value Date   TSH 1.380 02/23/2024   CBC    Component Value Date/Time   WBC 8.3 04/24/2024 1053   RBC 5.61 04/24/2024 1053   HGB 16.7 04/24/2024 1053   HGB 16.7 02/23/2024 1223   HCT 51.0 04/24/2024 1053   HCT 50.6 02/23/2024 1223   PLT 220 04/24/2024 1053   PLT 211 02/23/2024 1223   MCV 90.9 04/24/2024 1053   MCV 93 02/23/2024 1223   MCH 29.8 04/24/2024 1053   MCHC 32.7 04/24/2024 1053   RDW 12.7 04/24/2024 1053   RDW 12.3 02/23/2024 1223   Iron Studies No results found for: IRON, TIBC, FERRITIN, IRONPCTSAT Lipid Panel     Component Value Date/Time   CHOL 158 02/23/2024 1223   TRIG 237 (H) 02/23/2024 1223   HDL 47 02/23/2024 1223   CHOLHDL 3.4 02/23/2024 1223   CHOLHDL 5 07/15/2017 1126   VLDL 74.4 (H) 07/15/2017 1126   LDLCALC 72  02/23/2024 1223   LDLDIRECT 119.0 07/15/2017 1126   Hepatic Function Panel     Component Value Date/Time   PROT 7.3 02/23/2024 1223   ALBUMIN 4.4 02/23/2024 1223   AST 43 (H) 02/23/2024 1223   ALT 31 02/23/2024 1223   ALKPHOS 84 02/23/2024 1223   BILITOT 2.9 (H) 02/23/2024 1223      Component Value Date/Time   TSH 1.380 02/23/2024 1223   Nutritional Lab Results  Component Value Date   VD25OH 24.8 (L) 02/23/2024   VD25OH 64.54 09/15/2018   VD25OH 31.25 07/15/2017     Follow up:   Return 06/26/2024 at 2:40 PM.  He was informed of the importance of frequent follow up visits to maximize his success with intensive lifestyle modifications for his multiple health conditions.   Attestations:   I, Special Puri, acting as a stage manager for [employer], DO., have compiled all relevant documentation for today's office visit on behalf of Gavin Jenkins, DO, while in the presence of [employer], DO.  Pertinent positives were addressed with patient today. Reviewed by clinician on day of visit: allergies, medications, problem list, medical history, surgical history, family history, social history, and previous encounter notes.  I have reviewed the above documentation for accuracy and completeness, and I agree with the above. Gavin JINNY Smith, D.O.  The 21st Century Cures Act was signed into law in 2016 which includes the topic of electronic health records.  This provides immediate access to information in MyChart. This includes consultation notes, operative notes, office notes, lab results and pathology reports.  If you have any questions about what you read please let us  know at your next visit so we can discuss your concerns and take corrective action if need be.  We are right here with you.

## 2024-05-31 ENCOUNTER — Ambulatory Visit: Admitting: Internal Medicine

## 2024-06-11 ENCOUNTER — Other Ambulatory Visit: Payer: Self-pay

## 2024-06-11 DIAGNOSIS — E559 Vitamin D deficiency, unspecified: Secondary | ICD-10-CM

## 2024-06-11 MED ORDER — VITAMIN D (ERGOCALCIFEROL) 1.25 MG (50000 UNIT) PO CAPS: 50000.0000 [IU] | ORAL_CAPSULE | Freq: Every day | ORAL | 1 refills | Status: DC

## 2024-06-20 ENCOUNTER — Ambulatory Visit

## 2024-06-20 ENCOUNTER — Encounter

## 2024-06-26 ENCOUNTER — Encounter (INDEPENDENT_AMBULATORY_CARE_PROVIDER_SITE_OTHER): Payer: Self-pay | Admitting: Family Medicine

## 2024-06-26 ENCOUNTER — Telehealth: Payer: Self-pay

## 2024-06-26 ENCOUNTER — Ambulatory Visit (INDEPENDENT_AMBULATORY_CARE_PROVIDER_SITE_OTHER): Admitting: Family Medicine

## 2024-06-26 DIAGNOSIS — Z794 Long term (current) use of insulin: Secondary | ICD-10-CM

## 2024-06-26 DIAGNOSIS — E1159 Type 2 diabetes mellitus with other circulatory complications: Secondary | ICD-10-CM

## 2024-06-26 DIAGNOSIS — Z6841 Body Mass Index (BMI) 40.0 and over, adult: Secondary | ICD-10-CM

## 2024-06-26 MED ORDER — TIRZEPATIDE 2.5 MG/0.5ML ~~LOC~~ SOAJ: 2.5000 mg | SUBCUTANEOUS | 0 refills | Status: DC

## 2024-06-26 NOTE — Progress Notes (Signed)
 Gavin Smith, D.O.  ABFM, ABOM Specializing in Clinical Bariatric Medicine  Office located at: [address] [place], [place]  [address]      A) FOR THE CHRONIC DISEASE OF OBESITY:  Chief complaint: Obesity Gavin Smith is here to discuss his progress with his obesity treatment plan.   History of present illness / Interval history:  Gavin Smith is here today for his follow-up office visit.  Since last OV on 05/30/24, pt is down 15 lbs. Patient states that instead of journaling for the last 10 days he is eating something with protein every couple of hours and is snacking multiple times throughout the day. He endorses making sure he is eating protein every day and every meal.     05/30/24 11:00 06/26/24 14:00   Body Fat % 41.5 % 39.7 %  Muscle Mass (lbs) 156.8 lbs 153.2 lbs  Fat Mass (lbs) 117 lbs 106.2 lbs  Total Body Water  (lbs) 125 lbs 114.8 lbs  Visceral Fat Rating  26 24    Counseling done on how various foods will affect these numbers and how to maximize success.   Total lbs lost to date: - 17 lbs Total Fat Mass in lbs lost to date: - 11 lbs Total weight loss percentage to date: - 5.99 %    Starting Morbid obesity (HCC) 44.47 BMI 40.0-44.9, adult (HCC) - current BMI 41.81  Nutrition Therapy He is journaling 1200 - 1300 calories and 85+ grams protein and states he is following his eating plan approximately 90 % of the time.   - Tracking Calories/Macros: yes  - Eating More Whole Foods: yes  - Adequate Protein Intake: yes  - Adequate Water  Intake: no   - Skipping Meals: yes  - Sleeping 7-9 Hours/ Night: yes   [name] is currently in the action stage of change. As such, his goal is to continue weight management plan.  He has agreed to: continue current plan   Physical Activity Curvin is doing aerobic activity 60  minutes 4 days per week   [place] has been advised to work up to 300-450 minutes of moderate intensity aerobic activity a week and  strengthening exercises 2-3 times per week for cardiovascular health, weight loss maintenance and preservation of muscle mass.  He has agreed to : Increase volume of physical activity to a goal of 240 minutes a week   Behavioral Modifications Evidence-based interventions for health behavior change were utilized today including the discussion of  1) self monitoring techniques:    - Continue focusing on meal plan and getting proteins in.   2) SMART goals for next OV:    - Occasionally journal to make sure reaching calories and protein goals   Regarding patient's less desirable eating habits and patterns, we employed the technique of small changes.   We discussed the following today: increasing lean protein intake to established goals, decreasing simple carbohydrates , and work on tracking and journaling calories using tracking application Additional resources provided today: None   Medical Interventions/ Pharmacotherapy Previous Bariatric surgery: n/a Pharmacotherapy for weight loss: He is currently taking Mounjaro  2.5 mg for medical weight loss.    We discussed various medication options to help Gavin Smith with his weight loss efforts and we both agreed to : Adequate clinical response to anti-obesity medication, continue current regimen   B) OBESITY RELATED CONDITIONS ADDRESSED TODAY:  Type 2 diabetes mellitus with other specified complication, with long-term current use of insulin  [facility]) Assessment & Plan Lab Results  Component Value Date   HGBA1C 6.4 (A) 05/21/2024   HGBA1C 6.0 (H) 02/23/2024   HGBA1C 6.4 (H) 11/18/2023   INSULIN  2.7 02/23/2024    Patient is taking Mounjaro  2.5 mg weekly. Patient reports that he has needed less insulin  since starting the Mounjaro . He states that he is using about 1/2 to maybe even 1/3 of the amount of insulin  that he was previously using. Patients continuous glucose monitor has been in range 96% of the time in the last 30 days with an average of 126.  Patients prior average to starting the Mounjaro  was 158 and was in range 72% of the time. Patient denies having any GI upset or constipation. He reports that he has not been eating off plan and that his cravings have significantly decreased. He no longer craves sweets. Continue following prudent meal plan and decreasing simple carbs and sugars. Will refill Mounjaro  today.     Hypertension associated with diabetes [facility]) Assessment & Plan BP Readings from Last 3 Encounters:  06/26/24 117/80  05/30/24 120/72  05/21/24 120/70   The 10-year ASCVD risk score (Arnett DK, et al., 2019) is: 3.2%  Lab Results  Component Value Date   CREATININE 1.00 04/24/2024   On Demadex 20 mg twice daily. BP today is at goal- well controlled. No acute concerns today. Continue with medication and decreasing foods high in sodium like prepackaged frozen meals.     Medications Discontinued During This Encounter  Medication Reason   tirzepatide  (MOUNJARO ) 2.5 MG/0.5ML Pen Reorder     Meds ordered this encounter  Medications   tirzepatide  (MOUNJARO ) 2.5 MG/0.5ML Pen    Sig: Inject 2.5 mg into the skin once a week.    Dispense:  2 mL    Refill:  0     Follow up:   Return 07/31/2024 at 11:20 AM  He was informed of the importance of frequent follow up visits to maximize his success with intensive lifestyle modifications for his multiple health conditions.   Weight Summary and Biometrics   Weight Lost Since Last Visit: 15 lb  Weight Gained Since Last Visit: 0   Vitals Temp: 98.6 F (37 C) BP: 117/80 Pulse Rate: 84 SpO2: 96 %   Anthropometric Measurements Height: 5' 7 (1.702 m) Weight: 267 lb (121.1 kg) BMI (Calculated): 41.81 Weight at Last Visit: 282 lb Weight Lost Since Last Visit: 15 lb Weight Gained Since Last Visit: 0 Starting Weight: 284 lb Total Weight Loss (lbs): 17 lb (7.711 kg) Peak Weight: 376 lb   Body Composition  Body Fat %: 39.7 % Fat Mass (lbs): 106.2 lbs Muscle Mass  (lbs): 153.2 lbs Total Body Water  (lbs): 114.8 lbs Visceral Fat Rating : 24   Other Clinical Data Fasting: No Labs: No Today's Visit #: 6 Starting Date: 02/23/24    Objective:   PHYSICAL EXAM: Blood pressure 117/80, pulse 84, temperature 98.6 F (37 C), height 5' 7 (1.702 m), weight 267 lb (121.1 kg), SpO2 96%. Body mass index is 41.82 kg/m.  General: he is overweight, cooperative and in no acute distress. PSYCH: Has normal mood, affect and thought process.   HEENT: EOMI, sclerae are anicteric. Lungs: Normal breathing effort, no conversational dyspnea. Extremities: Moves * 4 Neurologic: A and O * 3, good insight  DIAGNOSTIC DATA REVIEWED: BMET    Component Value Date/Time   NA 139 04/24/2024 1053   NA 141 02/23/2024 1223   K 4.0 04/24/2024 1053   CL 102 04/24/2024 1053   CO2 27 04/24/2024  1053   GLUCOSE 151 (H) 04/24/2024 1053   BUN 16 04/24/2024 1053   BUN 16 02/23/2024 1223   CREATININE 1.00 04/24/2024 1053   CREATININE 0.81 07/15/2017 1126   CALCIUM 9.7 04/24/2024 1053   GFRNONAA >60 04/24/2024 1053   GFRNONAA 91 07/15/2017 1126   GFRAA 106 07/15/2017 1126   Lab Results  Component Value Date   HGBA1C 6.4 (A) 05/21/2024   HGBA1C 7.6 (H) 05/28/2011   Lab Results  Component Value Date   INSULIN  2.7 02/23/2024   Lab Results  Component Value Date   TSH 1.380 02/23/2024   CBC    Component Value Date/Time   WBC 8.3 04/24/2024 1053   RBC 5.61 04/24/2024 1053   HGB 16.7 04/24/2024 1053   HGB 16.7 02/23/2024 1223   HCT 51.0 04/24/2024 1053   HCT 50.6 02/23/2024 1223   PLT 220 04/24/2024 1053   PLT 211 02/23/2024 1223   MCV 90.9 04/24/2024 1053   MCV 93 02/23/2024 1223   MCH 29.8 04/24/2024 1053   MCHC 32.7 04/24/2024 1053   RDW 12.7 04/24/2024 1053   RDW 12.3 02/23/2024 1223   Iron Studies No results found for: IRON, TIBC, FERRITIN, IRONPCTSAT Lipid Panel     Component Value Date/Time   CHOL 158 02/23/2024 1223   TRIG 237 (H)  02/23/2024 1223   HDL 47 02/23/2024 1223   CHOLHDL 3.4 02/23/2024 1223   CHOLHDL 5 07/15/2017 1126   VLDL 74.4 (H) 07/15/2017 1126   LDLCALC 72 02/23/2024 1223   LDLDIRECT 119.0 07/15/2017 1126   Hepatic Function Panel     Component Value Date/Time   PROT 7.3 02/23/2024 1223   ALBUMIN 4.4 02/23/2024 1223   AST 43 (H) 02/23/2024 1223   ALT 31 02/23/2024 1223   ALKPHOS 84 02/23/2024 1223   BILITOT 2.9 (H) 02/23/2024 1223      Component Value Date/Time   TSH 1.380 02/23/2024 1223   Nutritional Lab Results  Component Value Date   VD25OH 24.8 (L) 02/23/2024   VD25OH 64.54 09/15/2018   VD25OH 31.25 07/15/2017    Attestations:   LILLETTE Sonny Laroche, acting as a stage manager for Gavin Jenkins, DO., have compiled all relevant documentation for today's office visit on behalf of Gavin Jenkins, DO, while in the presence of [employer], DO.    I have reviewed the above documentation for accuracy and completeness, and I agree with the above. Gavin JINNY Smith, D.O.  The 21st Century Cures Act was signed into law in 2016 which includes the topic of electronic health records.  This provides immediate access to information in MyChart.  This includes consultation notes, operative notes, office notes, lab results and pathology reports.  If you have any questions about what you read please let us  know at your next visit so we can discuss your concerns and take corrective action if need be.  We are right here with you.

## 2024-06-26 NOTE — Telephone Encounter (Signed)
 Attempted to contact pharmacy who called wanting verification of Vitamin D . Unable to reach anyone at this time.

## 2024-06-27 ENCOUNTER — Encounter: Payer: Self-pay | Admitting: Internal Medicine

## 2024-06-27 DIAGNOSIS — E559 Vitamin D deficiency, unspecified: Secondary | ICD-10-CM

## 2024-06-27 MED ORDER — VITAMIN D (ERGOCALCIFEROL) 1.25 MG (50000 UNIT) PO CAPS: 50000.0000 [IU] | ORAL_CAPSULE | Freq: Every day | ORAL | 1 refills | Status: DC

## 2024-07-03 ENCOUNTER — Ambulatory Visit (INDEPENDENT_AMBULATORY_CARE_PROVIDER_SITE_OTHER): Payer: Self-pay | Admitting: Family Medicine

## 2024-07-17 ENCOUNTER — Ambulatory Visit

## 2024-07-17 ENCOUNTER — Telehealth (INDEPENDENT_AMBULATORY_CARE_PROVIDER_SITE_OTHER): Payer: Self-pay | Admitting: Family Medicine

## 2024-07-17 ENCOUNTER — Other Ambulatory Visit (INDEPENDENT_AMBULATORY_CARE_PROVIDER_SITE_OTHER): Payer: Self-pay | Admitting: Physician Assistant

## 2024-07-17 ENCOUNTER — Ambulatory Visit (INDEPENDENT_AMBULATORY_CARE_PROVIDER_SITE_OTHER): Admitting: *Deleted

## 2024-07-17 VITALS — BP 110/75 | HR 99 | Temp 97.9°F | Ht 68.0 in | Wt 275.0 lb

## 2024-07-17 DIAGNOSIS — G4733 Obstructive sleep apnea (adult) (pediatric): Secondary | ICD-10-CM

## 2024-07-17 DIAGNOSIS — G4721 Circadian rhythm sleep disorder, delayed sleep phase type: Secondary | ICD-10-CM | POA: Diagnosis not present

## 2024-07-17 DIAGNOSIS — Z6841 Body Mass Index (BMI) 40.0 and over, adult: Secondary | ICD-10-CM | POA: Diagnosis not present

## 2024-07-17 DIAGNOSIS — J984 Other disorders of lung: Secondary | ICD-10-CM | POA: Diagnosis not present

## 2024-07-17 DIAGNOSIS — J4520 Mild intermittent asthma, uncomplicated: Secondary | ICD-10-CM

## 2024-07-17 DIAGNOSIS — E1169 Type 2 diabetes mellitus with other specified complication: Secondary | ICD-10-CM

## 2024-07-17 LAB — PULMONARY FUNCTION TEST
DL/VA % pred: 147 %
DL/VA: 6.7 ml/min/mmHg/L
DLCO cor % pred: 109 %
DLCO cor: 30.81 ml/min/mmHg
DLCO unc % pred: 109 %
DLCO unc: 30.81 ml/min/mmHg
FEF 25-75 Post: 3.33 L/s
FEF 25-75 Pre: 2.75 L/s
FEF2575-%Change-Post: 21 %
FEF2575-%Pred-Post: 95 %
FEF2575-%Pred-Pre: 78 %
FEV1-%Change-Post: 4 %
FEV1-%Pred-Post: 71 %
FEV1-%Pred-Pre: 69 %
FEV1-Post: 2.73 L
FEV1-Pre: 2.62 L
FEV1FVC-%Change-Post: 4 %
FEV1FVC-%Pred-Pre: 103 %
FEV6-%Change-Post: 0 %
FEV6-%Pred-Post: 68 %
FEV6-%Pred-Pre: 68 %
FEV6-Post: 3.19 L
FEV6-Pre: 3.2 L
FEV6FVC-%Change-Post: 0 %
FEV6FVC-%Pred-Post: 103 %
FEV6FVC-%Pred-Pre: 102 %
FVC-%Change-Post: 0 %
FVC-%Pred-Post: 66 %
FVC-%Pred-Pre: 66 %
FVC-Post: 3.19 L
FVC-Pre: 3.21 L
Post FEV1/FVC ratio: 85 %
Post FEV6/FVC ratio: 100 %
Pre FEV1/FVC ratio: 82 %
Pre FEV6/FVC Ratio: 100 %
RV % pred: 86 %
RV: 1.6 L
TLC % pred: 72 %
TLC: 4.77 L

## 2024-07-17 MED ORDER — TIRZEPATIDE 2.5 MG/0.5ML ~~LOC~~ SOAJ: 2.5000 mg | SUBCUTANEOUS | 0 refills | Status: DC
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
## 2024-07-17 NOTE — Progress Notes (Signed)
 Full PFT performed today.

## 2024-07-17 NOTE — Patient Instructions (Signed)
" °  VISIT SUMMARY:  During your follow-up visit, we discussed your sleep apnea, asthma, restrictive lung disease, and sleep issues. You are managing your sleep apnea well with CPAP therapy, and we have made some adjustments to your asthma treatment. We also reviewed your pulmonary function test results and discussed strategies to improve your sleep schedule.  YOUR PLAN:  -OBSTRUCTIVE SLEEP APNEA: Obstructive sleep apnea is a condition where your airway becomes blocked during sleep, causing breathing interruptions. Your CPAP therapy is effectively managing this condition with a residual AHI of 0.7. Continue using your CPAP machine with the current settings and ensure regular cleaning of the machine and mask.  -MILD INTERMITTENT ASTHMA: Asthma is a condition where your airways become inflamed and narrow, making it difficult to breathe. Your asthma is mild and intermittent, with no recent flare-ups. We are transitioning you from Airsupra to Symbicort 18. Use Symbicort 18, two puffs twice daily as needed.  -RESTRICTIVE LUNG DISEASE: Restrictive lung disease is a condition where your lungs cannot fully expand, making it hard to breathe. Your pulmonary function test shows mild restriction with normal gas transfer. We have ordered a chest x-ray to further evaluate your lung condition. If the chest x-ray is abnormal, we may consider a CT scan.  -DELAYED SLEEP PHASE SYNDROME: Delayed sleep phase syndrome is a condition where your sleep is delayed by two or more hours beyond the conventional bedtime, causing difficulty in waking up at a desired time. We discussed strategies to adjust your sleep schedule, including using melatonin and light exposure therapy. Take melatonin 0.5 to 1 mg four hours before your desired sleep time and expose yourself to bright light at noon to help regulate your sleep cycle. Gradually adjust the timing of light exposure and melatonin moving it by 30 min earlier every 5-7 days to shift  sleep schedule earlier.  -MORBID OBESITY: Morbid obesity is a condition where excess body fat negatively affects your health. This may contribute to your restrictive lung disease. We recommend focusing on a healthy diet and regular exercise to manage your weight.                        Contains text generated by Abridge.                                 Contains text generated by Abridge.   "

## 2024-07-17 NOTE — Telephone Encounter (Signed)
 Pt called and is taking mounjaro  and was given 4 injections but her follow up appt is 5 wks out as provider is out of the office the last week in December. What should she do? Please advise

## 2024-07-17 NOTE — Patient Instructions (Signed)
 Full PFT performed today.

## 2024-07-28 ENCOUNTER — Other Ambulatory Visit: Payer: Self-pay | Admitting: Internal Medicine

## 2024-07-28 DIAGNOSIS — E559 Vitamin D deficiency, unspecified: Secondary | ICD-10-CM

## 2024-07-31 ENCOUNTER — Ambulatory Visit (INDEPENDENT_AMBULATORY_CARE_PROVIDER_SITE_OTHER): Admitting: Family Medicine

## 2024-07-31 ENCOUNTER — Encounter (INDEPENDENT_AMBULATORY_CARE_PROVIDER_SITE_OTHER): Payer: Self-pay | Admitting: Family Medicine

## 2024-07-31 DIAGNOSIS — Z7985 Long-term (current) use of injectable non-insulin antidiabetic drugs: Secondary | ICD-10-CM

## 2024-07-31 DIAGNOSIS — E785 Hyperlipidemia, unspecified: Secondary | ICD-10-CM

## 2024-07-31 DIAGNOSIS — Z794 Long term (current) use of insulin: Secondary | ICD-10-CM

## 2024-07-31 DIAGNOSIS — E559 Vitamin D deficiency, unspecified: Secondary | ICD-10-CM

## 2024-07-31 DIAGNOSIS — Z6841 Body Mass Index (BMI) 40.0 and over, adult: Secondary | ICD-10-CM | POA: Diagnosis not present

## 2024-07-31 DIAGNOSIS — E1169 Type 2 diabetes mellitus with other specified complication: Secondary | ICD-10-CM

## 2024-07-31 MED ORDER — TIRZEPATIDE 2.5 MG/0.5ML ~~LOC~~ SOAJ: 2.5000 mg | SUBCUTANEOUS | 0 refills | Status: DC

## 2024-07-31 NOTE — Progress Notes (Signed)
 "  Gavin Smith, D.O.  ABFM, ABOM Specializing in Clinical Bariatric Medicine  Office located at: [address] [place], [place]  [address]   Review labs at next OV.   A) FOR THE CHRONIC DISEASE OF OBESITY:  Chief complaint: Obesity Gavin Smith is here to discuss his progress with his obesity treatment plan.   History of present illness / Interval history:  Gavin Smith is here today for his follow-up office visit.  Since last OV on 06/26/24, pt is down 7 lbs. Patient states that he had a bowel obtruction on Sunday. He endorses laying off on carbs.     12 /02/25 14:00 07/31/24 11:00   Body Fat % 39.7 % 37.9 %  Muscle Mass (lbs) 153.2 lbs 153.8 lbs  Fat Mass (lbs) 106.2 lbs 98.6 lbs  Total Body Water  (lbs) 114.8 lbs 110.2 lbs  Visceral Fat Rating  24 22    Counseling done on how various foods will affect these numbers and how to maximize success.  Total lbs lost to date: - 24 lbs Total Fat Mass in lbs lost to date: - 18.6 lbs Total weight loss percentage to date: - 8.45 %    Starting Morbid obesity (HCC) 44.47 BMI 40.0-44.9, adult (HCC) - current BMI 40.71  Nutrition Therapy He is  journaling 1200 - 1300 calories and 85+ grams protein and states he is following his eating plan approximately 90 % of the time.   - Tracking Calories/Macros: no   - Eating More Whole Foods: yes  - Adequate Protein Intake: no   - Adequate Water  Intake: no   - Skipping Meals: yes  - Sleeping 7-9 Hours/ Night: no    Corbitt is currently in the action stage of change. As such, his goal is to continue weight management plan.  He has agreed to: continue current plan   Physical Activity Rashun is doing yoga and tai chi 30  minutes 3 days per week   Onie has been advised to work up to 300-450 minutes of moderate intensity aerobic activity a week and strengthening exercises 2-3 times per week for cardiovascular health, weight loss maintenance and preservation of muscle mass.   He has agreed to : Increase volume of physical activity to a goal of 240 minutes a week and Combine aerobic and strengthening exercises for efficiency and improved cardiometabolic health.   Behavioral Modifications Evidence-based interventions for health behavior change were utilized today including the discussion of   1) SMART goals for next OV:    - Increase exercise  Regarding patient's less desirable eating habits and patterns, we employed the technique of small changes.   We discussed the following today: continue to work on maintaining a reduced calorie state, getting the recommended amount of protein, incorporating whole foods, making healthy choices, staying well hydrated and practicing mindfulness when eating. Additional resources provided today: None   Medical Interventions/ Pharmacotherapy Previous Bariatric surgery: n/a Pharmacotherapy for weight loss: He is currently taking Mounjaro  2.5 mg once weekly for medical weight loss.    We discussed various medication options to help Gavin Smith with his weight loss efforts and we both agreed to : Adequate clinical response to anti-obesity medication, continue current anti-obesity regimen   B) OBESITY RELATED CONDITIONS ADDRESSED TODAY:  Type 2 diabetes mellitus with other specified complication, with long-term current use of insulin  [facility]) Assessment & Plan Lab Results  Component Value Date   HGBA1C 6.4 (A) 05/21/2024   HGBA1C 6.0 (H) 02/23/2024   HGBA1C 6.4 (  H) 11/18/2023   INSULIN  2.7 02/23/2024    On Mounjaro  2.5 mg once weekly. With reported good compliance and tolerance. Patient states that he recently he has been getting blockages but does not think that it is related to the Mounjaro . Reminded patient that Mounjaro  causes slowed peristalsis. Stressed the importance of having the adequate amount of water . Reminded patient that he can use Miralax as needed. Patient stated that Mounjaro  dose at the moment is good. Reports that  the cravings for sweets is no longer there. Will refill today. Continue with medication and following prudent meal plan, decreasing simple carbs and sugars.     Vitamin D  deficiency Assessment & Plan Lab Results  Component Value Date   VD25OH 24.8 (L) 02/23/2024   VD25OH 64.54 09/15/2018   VD25OH 31.25 07/15/2017   Taking ERGO 50K units once daily. With reported good compliance and tolerance. Patient states that he has no issue remembering to take supplementation. Last labs showed levels were low. Will recheck levels today. Continue with supplementation.     Hyperlipidemia associated with type 2 diabetes mellitus [facility]) Assessment & Plan Lab Results  Component Value Date   CHOL 158 02/23/2024   HDL 47 02/23/2024   LDLCALC 72 02/23/2024   LDLDIRECT 119.0 07/15/2017   TRIG 237 (H) 02/23/2024   CHOLHDL 3.4 02/23/2024    On Lipitor 20 mg daily with reported good compliance and tolerance. Patient previous labs show and elevated trig level. Will recheck lipid panel today. Continue follow prudent meal plan and decreasing saturated and trans fat.    Medications Discontinued During This Encounter  Medication Reason   tirzepatide  (MOUNJARO ) 2.5 MG/0.5ML Pen Reorder     Meds ordered this encounter  Medications   tirzepatide  (MOUNJARO ) 2.5 MG/0.5ML Pen    Sig: Inject 2.5 mg into the skin once a week.    Dispense:  2 mL    Refill:  0     Follow up:   Return 08/27/2024 at 3:40 PM  He was informed of the importance of frequent follow up visits to maximize his success with intensive lifestyle modifications for his multiple health conditions.   Weight Summary and Biometrics   Weight Lost Since Last Visit: 7lb  Weight Gained Since Last Visit: 0lb    Vitals Temp: 98.3 F (36.8 C) BP: 112/77 Pulse Rate: (!) 107 SpO2: 96 %   Anthropometric Measurements Height: 5' 7 (1.702 m) Weight: 260 lb (117.9 kg) BMI (Calculated): 40.71 Weight at Last Visit: 267lb Weight Lost Since  Last Visit: 7lb Weight Gained Since Last Visit: 0lb Starting Weight: 284lb Total Weight Loss (lbs): 24 lb (10.9 kg) Peak Weight: 376lb   Body Composition  Body Fat %: 37.9 % Fat Mass (lbs): 98.6 lbs Muscle Mass (lbs): 153.8 lbs Total Body Water  (lbs): 110.2 lbs Visceral Fat Rating : 22   Other Clinical Data Fasting: yes Labs: yes Today's Visit #: 7 Starting Date: 02/23/24    Objective:   PHYSICAL EXAM: Blood pressure 112/77, pulse (!) 107, temperature 98.3 F (36.8 C), height 5' 7 (1.702 m), weight 260 lb (117.9 kg), SpO2 96%. Body mass index is 40.72 kg/m.  General: he is overweight, cooperative and in no acute distress. PSYCH: Has normal mood, affect and thought process.   HEENT: EOMI, sclerae are anicteric. Lungs: Normal breathing effort, no conversational dyspnea. Extremities: Moves * 4 Neurologic: A and O * 3, good insight  DIAGNOSTIC DATA REVIEWED: BMET    Component Value Date/Time   NA 139 04/24/2024 1053  NA 141 02/23/2024 1223   K 4.0 04/24/2024 1053   CL 102 04/24/2024 1053   CO2 27 04/24/2024 1053   GLUCOSE 151 (H) 04/24/2024 1053   BUN 16 04/24/2024 1053   BUN 16 02/23/2024 1223   CREATININE 1.00 04/24/2024 1053   CREATININE 0.81 07/15/2017 1126   CALCIUM 9.7 04/24/2024 1053   GFRNONAA >60 04/24/2024 1053   GFRNONAA 91 07/15/2017 1126   GFRAA 106 07/15/2017 1126   Lab Results  Component Value Date   HGBA1C 6.4 (A) 05/21/2024   HGBA1C 7.6 (H) 05/28/2011   Lab Results  Component Value Date   INSULIN  2.7 02/23/2024   Lab Results  Component Value Date   TSH 1.380 02/23/2024   CBC    Component Value Date/Time   WBC 8.3 04/24/2024 1053   RBC 5.61 04/24/2024 1053   HGB 16.7 04/24/2024 1053   HGB 16.7 02/23/2024 1223   HCT 51.0 04/24/2024 1053   HCT 50.6 02/23/2024 1223   PLT 220 04/24/2024 1053   PLT 211 02/23/2024 1223   MCV 90.9 04/24/2024 1053   MCV 93 02/23/2024 1223   MCH 29.8 04/24/2024 1053   MCHC 32.7 04/24/2024 1053    RDW 12.7 04/24/2024 1053   RDW 12.3 02/23/2024 1223   Iron Studies No results found for: IRON, TIBC, FERRITIN, IRONPCTSAT Lipid Panel     Component Value Date/Time   CHOL 158 02/23/2024 1223   TRIG 237 (H) 02/23/2024 1223   HDL 47 02/23/2024 1223   CHOLHDL 3.4 02/23/2024 1223   CHOLHDL 5 07/15/2017 1126   VLDL 74.4 (H) 07/15/2017 1126   LDLCALC 72 02/23/2024 1223   LDLDIRECT 119.0 07/15/2017 1126   Hepatic Function Panel     Component Value Date/Time   PROT 7.3 02/23/2024 1223   ALBUMIN 4.4 02/23/2024 1223   AST 43 (H) 02/23/2024 1223   ALT 31 02/23/2024 1223   ALKPHOS 84 02/23/2024 1223   BILITOT 2.9 (H) 02/23/2024 1223      Component Value Date/Time   TSH 1.380 02/23/2024 1223   Nutritional Lab Results  Component Value Date   VD25OH 24.8 (L) 02/23/2024   VD25OH 64.54 09/15/2018   VD25OH 31.25 07/15/2017    Attestations:   LILLETTE Sonny Laroche, acting as a stage manager for Gavin Jenkins, DO., have compiled all relevant documentation for today's office visit on behalf of Gavin Jenkins, DO, while in the presence of [employer], DO.  I have reviewed the above documentation for accuracy and completeness, and I agree with the above. Gavin JINNY Smith, D.O.  The 21st Century Cures Act was signed into law in 2016 which includes the topic of electronic health records.  This provides immediate access to information in MyChart.  This includes consultation notes, operative notes, office notes, lab results and pathology reports.  If you have any questions about what you read please let us  know at your next visit so we can discuss your concerns and take corrective action if need be.  We are right here with you.  "

## 2024-08-01 LAB — COMPREHENSIVE METABOLIC PANEL WITH GFR
ALT: 27 IU/L (ref 0–44)
AST: 38 IU/L (ref 0–40)
Albumin: 4.7 g/dL (ref 4.1–5.1)
Alkaline Phosphatase: 78 IU/L (ref 47–123)
BUN/Creatinine Ratio: 17 (ref 9–20)
BUN: 22 mg/dL (ref 6–24)
Bilirubin Total: 4.6 mg/dL — ABNORMAL HIGH (ref 0.0–1.2)
CO2: 25 mmol/L (ref 20–29)
Calcium: 9.8 mg/dL (ref 8.7–10.2)
Chloride: 93 mmol/L — ABNORMAL LOW (ref 96–106)
Creatinine, Ser: 1.3 mg/dL — ABNORMAL HIGH (ref 0.76–1.27)
Globulin, Total: 3.2 g/dL (ref 1.5–4.5)
Glucose: 139 mg/dL — ABNORMAL HIGH (ref 70–99)
Potassium: 3.8 mmol/L (ref 3.5–5.2)
Sodium: 138 mmol/L (ref 134–144)
Total Protein: 7.9 g/dL (ref 6.0–8.5)
eGFR: 69 mL/min/1.73

## 2024-08-01 LAB — LIPID PANEL
Chol/HDL Ratio: 2.8 ratio (ref 0.0–5.0)
Cholesterol, Total: 168 mg/dL (ref 100–199)
HDL: 59 mg/dL
LDL Chol Calc (NIH): 87 mg/dL (ref 0–99)
Triglycerides: 125 mg/dL (ref 0–149)
VLDL Cholesterol Cal: 22 mg/dL (ref 5–40)

## 2024-08-01 LAB — VITAMIN D 25 HYDROXY (VIT D DEFICIENCY, FRACTURES): Vit D, 25-Hydroxy: 56.6 ng/mL (ref 30.0–100.0)

## 2024-08-02 ENCOUNTER — Ambulatory Visit (INDEPENDENT_AMBULATORY_CARE_PROVIDER_SITE_OTHER): Payer: Self-pay | Admitting: Family Medicine

## 2024-08-06 ENCOUNTER — Ambulatory Visit: Payer: Self-pay | Admitting: Pulmonary Disease

## 2024-08-06 NOTE — Progress Notes (Signed)
 Gavin Smith                                          MRN: [id number]   08/06/2024   The [facility] Quality Team Specialist reviewed this patient medical record for the purposes of chart review for care gap closure. The following were reviewed: abstraction for care gap closure-glycemic status assessment.    [facility]BCI Quality Team

## 2024-08-24 ENCOUNTER — Ambulatory Visit (INDEPENDENT_AMBULATORY_CARE_PROVIDER_SITE_OTHER): Admitting: Family Medicine

## 2024-08-24 ENCOUNTER — Encounter (INDEPENDENT_AMBULATORY_CARE_PROVIDER_SITE_OTHER): Payer: Self-pay

## 2024-08-24 ENCOUNTER — Encounter (INDEPENDENT_AMBULATORY_CARE_PROVIDER_SITE_OTHER): Payer: Self-pay | Admitting: Family Medicine

## 2024-08-24 DIAGNOSIS — Z6841 Body Mass Index (BMI) 40.0 and over, adult: Secondary | ICD-10-CM

## 2024-08-24 DIAGNOSIS — E1169 Type 2 diabetes mellitus with other specified complication: Secondary | ICD-10-CM

## 2024-08-24 DIAGNOSIS — E559 Vitamin D deficiency, unspecified: Secondary | ICD-10-CM

## 2024-08-24 MED ORDER — TIRZEPATIDE 5 MG/0.5ML ~~LOC~~ SOAJ: 5.0000 mg | SUBCUTANEOUS | 0 refills | Status: AC

## 2024-08-24 NOTE — Progress Notes (Incomplete)
 "  Gavin Smith, D.O.  ABFM, ABOM Specializing in Clinical Bariatric Medicine  Office located at: [address] [place], [place]  [address]      Medications Discontinued During This Encounter  Medication Reason   tirzepatide  (MOUNJARO ) 2.5 MG/0.5ML Pen      Meds ordered this encounter  Medications   tirzepatide  (MOUNJARO ) 5 MG/0.5ML Pen    Sig: Inject 5 mg into the skin once a week.    Dispense:  2 mL    Refill:  0      FOR THE CHRONIC DISEASE OF OBESITY:  Chief complaint: Obesity Gavin Smith is here to discuss his progress with his obesity treatment plan.   History of present illness / Interval history:  Gavin Smith is here today for his follow-up office visit.  Since last OV on 07/31/24 with provider: Dr.O, patient was doing good until school was canceled. .    Pt has been struggling with:  []  meal planning and prepping []  exercise [x]  sleep [x]  stressors []  mood []  chronic or acute medical conditions-  []  eating out more []  Nothing, doing great  Pt has been working on and improving their:  []  meal planning and prepping []  exercise []  sleep hygiene []  water  intake []  strategies to better manage personal stressors []  strategies to better manage mood []  eating out less [x]  Nothing particular at this time  When asked by CMA prior to our office visit today, pt states they have been:  - Focused on eating fresh, unprocessed foods?  Yes   - Focused on eating lean proteins with each meal?  Yes   - Sleeping 7-9 Hours/ Night?  Yes   - Skipping Meals? Yes - States he is following his healthy eating plan approximately 100 % of the time.   Recent weight loss data history   07/31/24 11:00 08/24/24 12:00   Body Fat % 37.9 % 38.3 %  Muscle Mass (lbs) 153.8 lbs 153.2 lbs  Fat Mass (lbs) 98.6 lbs 100 lbs  Total Body Water  (lbs) 110.2 lbs 113.6 lbs  Visceral Fat Rating  22 22   Counseling done on how various foods/ behaviors will affect these numbers and how  to maximize weight loss success discussed today in detail based on these findings.  Total lbs lost to date: - 23 lbs Total Fat Mass in lbs lost to date: - 18.6 lbs Total weight loss percentage to date since starting program:  - 8.10 %    Starting Morbid obesity (HCC) 44.47 BMI 40.0-44.9, adult (HCC) - current BMI 40.87  Physician directed Nutrition Therapy prescription: He is keeping a food journal and adhering to recommended goals of 1200-1300 calories and 85+ protein.    Gavin Smith is currently in the action stage of change. As such, his goal is to continue journaling.     He has agreed to: continue current reduced-calorie meal plan    Physician directed Behavioral Modification prescription: Evidence-based interventions for healthy behavior change were utilized today including the discussion of small changes and SMART goals.  barriers to successful adherence to behavorial change for wt loss: stress with school  We discussed the following today: work on managing stress, creating time for self-care and relaxation    Physician directed Physical Activity prescription: Gavin Smith is doing cardio 40  minutes 3 days per week  barriers to successful adherence to exercise for wt loss: weather  Gavin Smith has been educated on how muscle tissues will burn more calories than adipose tissues, so  maintaining or building more muscle will help keep metabolism elevated and helps support sustainable weight loss   and how exercise will help regulate blood sugar levels and prevent excess fat storage  Exercise prescription: He has agreed to Combine aerobic and strengthening exercises for efficiency and improved cardiometabolic health.    Medical Interventions/ Pharmacotherapy Previously tried medical weight loss medications/ therapies: None  Pharmacotherapy for weight loss: He is currently taking Mounjaro  2.5 mg weekly for medical weight loss.    We discussed various medication options to help  Branch with his weight loss efforts and we both agreed to:  Increase dose of Mounjaro  to 5mg  mg.   SPECIFIC behavorial / nutritional / exercise goals for next office visit:   Practice 4-7-8 breathing twice daily    B) OBESITY RELATED CONDITIONS ADDRESSED TODAY:  Type 2 diabetes mellitus with other specified complication, with long-term current use of insulin  Gavin Smith) Assessment & Plan Lab Results  Component Value Date   HGBA1C 6.4 (A) 05/21/2024   HGBA1C 6.0 (H) 02/23/2024   HGBA1C 6.4 (H) 11/18/2023   INSULIN  2.7 02/23/2024      Component Value Date/Time   NA 138 07/31/2024 1438   K 3.8 07/31/2024 1438   CL 93 (L) 07/31/2024 1438   CO2 25 07/31/2024 1438   GLUCOSE 139 (H) 07/31/2024 1438   GLUCOSE 151 (H) 04/24/2024 1053   BUN 22 07/31/2024 1438   CREATININE 1.30 (H) 07/31/2024 1438   CREATININE 0.81 07/15/2017 1126   CALCIUM 9.8 07/31/2024 1438   PROT 7.9 07/31/2024 1438   ALBUMIN 4.7 07/31/2024 1438   AST 38 07/31/2024 1438   ALT 27 07/31/2024 1438   ALKPHOS 78 07/31/2024 1438   BILITOT 4.6 (H) 07/31/2024 1438   EGFR 69 07/31/2024 1438   GFRNONAA >60 04/24/2024 1053   GFRNONAA 91 07/15/2017 1126   On Mounjaro  2.5 mg weekly with reported good compliance and tolerance. Patient states that the medication has helped him the the food noise and instead of eating the junk food he is focusing on eating his protein. He denies having any constipation or reflux. He states that he has required 30-40% less insulin  and has over 50 units of insulin  left in his pod today. His CGM shoes that he has been in range 92% of the time and his average blood glucose was 126. Since patient h=denies any side effect on medication mutually agreed to Increase Mounjaro  to 5mg  weekly.   Patient labs show an increase in Bilirubin and creatinine. He states that he visited his PCP and got labs drawn there as wel but we are unable to see the labs. He will bring in the paper work at next OV or scan them in.  He states that those result caused his PCP to want another draw in 2 weeks. Will follow alongside PCP.     Vitamin D  deficiency Assessment & Plan Lab Results  Component Value Date   VD25OH 56.6 07/31/2024   VD25OH 24.8 (L) 02/23/2024   VD25OH 64.54 09/15/2018   Taking ERGO 50K once daily with reported good compliance and tolerance. Vit D levels are at goal. Continue with supplementation per PCP. As patient sheds fat he will continue to rise his Vit D levels.     Hyperlipidemia associated with type 2 diabetes mellitus [facility]) Assessment & Plan Lab Results  Component Value Date   CHOL 168 07/31/2024   HDL 59 07/31/2024   LDLCALC 87 07/31/2024   LDLDIRECT 119.0 07/15/2017   TRIG 125 07/31/2024  CHOLHDL 2.8 07/31/2024    On Lipitor 20 mg daily and Lovaza 2 capsules BID with reported good compliance and tolerance. Trig levels have decreased from 237 to 125. HDL levels are at goal at above 60. As patient increased exercise HDL levels will also increase. Continue with medication and decreasing saturated and trans fat.    Follow up:   Return 09/19/2024 at 2:20 PM  He was informed of the importance of frequent follow up visits to maximize his success with intensive lifestyle modifications for his multiple health conditions.   Weight Summary and Biometrics   Weight Lost Since Last Visit: 0 lb  Weight Gained Since Last Visit: 1 lb   Vitals Temp: 98.7 F (37.1 C) BP: 116/78 Pulse Rate: 94 SpO2: 99 %   Anthropometric Measurements Height: 5' 7 (1.702 m) Weight: 261 lb (118.4 kg) BMI (Calculated): 40.87 Weight at Last Visit: 260 lb Weight Lost Since Last Visit: 0 lb Weight Gained Since Last Visit: 1 lb Starting Weight: 284 lb Total Weight Loss (lbs): 23 lb (10.4 kg) Peak Weight: 376 lb   Body Composition  Body Fat %: 38.3 % Fat Mass (lbs): 100 lbs Muscle Mass (lbs): 153.2 lbs Total Body Water  (lbs): 113.6 lbs Visceral Fat Rating : 22   Other Clinical  Data Fasting: no Labs: no Today's Visit #: 8 Starting Date: 02/23/24    Objective:   PHYSICAL EXAM: Blood pressure 116/78, pulse 94, temperature 98.7 F (37.1 C), height 5' 7 (1.702 m), weight 261 lb (118.4 kg), SpO2 99%. Body mass index is 40.88 kg/m. General: he is overweight, cooperative and in no acute distress. PSYCH: Has normal mood, affect and thought process.   HEENT: EOMI, sclerae are anicteric. Lungs: Normal breathing effort, no conversational dyspnea. Extremities: Moves * 4 Neurologic: A and O * 3, good insight  DIAGNOSTIC DATA REVIEWED: BMET    Component Value Date/Time   NA 138 07/31/2024 1438   K 3.8 07/31/2024 1438   CL 93 (L) 07/31/2024 1438   CO2 25 07/31/2024 1438   GLUCOSE 139 (H) 07/31/2024 1438   GLUCOSE 151 (H) 04/24/2024 1053   BUN 22 07/31/2024 1438   CREATININE 1.30 (H) 07/31/2024 1438   CREATININE 0.81 07/15/2017 1126   CALCIUM 9.8 07/31/2024 1438   GFRNONAA >60 04/24/2024 1053   GFRNONAA 91 07/15/2017 1126   GFRAA 106 07/15/2017 1126   Lab Results  Component Value Date   HGBA1C 6.4 (A) 05/21/2024   HGBA1C 7.6 (H) 05/28/2011   Lab Results  Component Value Date   INSULIN  2.7 02/23/2024   Lab Results  Component Value Date   TSH 1.380 02/23/2024   CBC    Component Value Date/Time   WBC 8.3 04/24/2024 1053   RBC 5.61 04/24/2024 1053   HGB 16.7 04/24/2024 1053   HGB 16.7 02/23/2024 1223   HCT 51.0 04/24/2024 1053   HCT 50.6 02/23/2024 1223   PLT 220 04/24/2024 1053   PLT 211 02/23/2024 1223   MCV 90.9 04/24/2024 1053   MCV 93 02/23/2024 1223   MCH 29.8 04/24/2024 1053   MCHC 32.7 04/24/2024 1053   RDW 12.7 04/24/2024 1053   RDW 12.3 02/23/2024 1223   Iron Studies No results found for: IRON, TIBC, FERRITIN, IRONPCTSAT Lipid Panel     Component Value Date/Time   CHOL 168 07/31/2024 1438   TRIG 125 07/31/2024 1438   HDL 59 07/31/2024 1438   CHOLHDL 2.8 07/31/2024 1438   CHOLHDL 5 07/15/2017 1126   VLDL 74.4  (  H) 07/15/2017 1126   LDLCALC 87 07/31/2024 1438   LDLDIRECT 119.0 07/15/2017 1126   Hepatic Function Panel     Component Value Date/Time   PROT 7.9 07/31/2024 1438   ALBUMIN 4.7 07/31/2024 1438   AST 38 07/31/2024 1438   ALT 27 07/31/2024 1438   ALKPHOS 78 07/31/2024 1438   BILITOT 4.6 (H) 07/31/2024 1438      Component Value Date/Time   TSH 1.380 02/23/2024 1223   Nutritional Lab Results  Component Value Date   VD25OH 56.6 07/31/2024   VD25OH 24.8 (L) 02/23/2024   VD25OH 64.54 09/15/2018    Attestations:   LILLETTE Sonny Laroche, acting as a medical scribe for Gavin Jenkins, DO., have compiled all relevant documentation for today's office visit on behalf of Gavin Jenkins, DO, while in the presence of [employer], DO.  I have reviewed the above documentation for accuracy and completeness, and I agree with the above. Gavin JINNY Smith, D.O.  The 21st Century Cures Act was signed into law in 2016 which includes the topic of electronic health records.  This provides immediate access to information in MyChart.  This includes consultation notes, operative notes, office notes, lab results and pathology reports.  If you have any questions about what you read please let us  know at your next visit so we can discuss your concerns and take corrective action if need be.  We are right here with you.  "

## 2024-08-27 ENCOUNTER — Ambulatory Visit (INDEPENDENT_AMBULATORY_CARE_PROVIDER_SITE_OTHER): Admitting: Family Medicine

## 2024-08-30 ENCOUNTER — Other Ambulatory Visit: Payer: Self-pay | Admitting: Internal Medicine

## 2024-08-30 ENCOUNTER — Telehealth: Payer: Self-pay

## 2024-08-30 ENCOUNTER — Encounter: Payer: Self-pay | Admitting: Internal Medicine

## 2024-08-30 DIAGNOSIS — E559 Vitamin D deficiency, unspecified: Secondary | ICD-10-CM

## 2024-08-30 NOTE — Telephone Encounter (Signed)
 Patient sent message requesting chart notes and new orders for his Dexcom be sent to [facility]. Order and most recent notes sent/uploaded via parachute. Patient made aware via Mychart.

## 2024-08-31 ENCOUNTER — Other Ambulatory Visit: Payer: Self-pay | Admitting: Internal Medicine

## 2024-08-31 DIAGNOSIS — E1142 Type 2 diabetes mellitus with diabetic polyneuropathy: Secondary | ICD-10-CM

## 2024-09-19 ENCOUNTER — Ambulatory Visit (INDEPENDENT_AMBULATORY_CARE_PROVIDER_SITE_OTHER): Admitting: Family Medicine

## 2024-09-24 ENCOUNTER — Ambulatory Visit (INDEPENDENT_AMBULATORY_CARE_PROVIDER_SITE_OTHER): Admitting: Family Medicine

## 2024-10-29 ENCOUNTER — Ambulatory Visit (INDEPENDENT_AMBULATORY_CARE_PROVIDER_SITE_OTHER): Admitting: Family Medicine
